# Patient Record
Sex: Female | Born: 2000 | Hispanic: Yes | Marital: Single | Smoking: Never smoker
Health system: Southern US, Community
[De-identification: ages and names within clinical notes are randomized; demographics above are authoritative.]

## PROBLEM LIST (undated history)

## (undated) DIAGNOSIS — Z789 Other specified health status: Secondary | ICD-10-CM

## (undated) DIAGNOSIS — D65 Disseminated intravascular coagulation [defibrination syndrome]: Secondary | ICD-10-CM

## (undated) DIAGNOSIS — O009 Unspecified ectopic pregnancy without intrauterine pregnancy: Secondary | ICD-10-CM

## (undated) HISTORY — PX: NO PAST SURGERIES: SHX2092

## (undated) HISTORY — PX: TOOTH EXTRACTION: SUR596

---
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
## 2021-11-15 ENCOUNTER — Other Ambulatory Visit: Payer: Self-pay

## 2021-11-15 ENCOUNTER — Inpatient Hospital Stay (HOSPITAL_COMMUNITY)
Admission: AD | Admit: 2021-11-15 | Discharge: 2021-11-15 | Disposition: A | Discharge status: Home / Self Care | Payer: Self-pay | Attending: Obstetrics & Gynecology | Admitting: Obstetrics & Gynecology

## 2021-11-15 ENCOUNTER — Encounter (HOSPITAL_COMMUNITY): Payer: Self-pay | Admitting: Obstetrics & Gynecology

## 2021-11-15 DIAGNOSIS — Z6790 Unspecified blood type, Rh positive: Secondary | ICD-10-CM

## 2021-11-15 DIAGNOSIS — O26851 Spotting complicating pregnancy, first trimester: Secondary | ICD-10-CM

## 2021-11-15 DIAGNOSIS — O4690 Antepartum hemorrhage, unspecified, unspecified trimester: Secondary | ICD-10-CM

## 2021-11-15 DIAGNOSIS — Z3A10 10 weeks gestation of pregnancy: Secondary | ICD-10-CM

## 2021-11-15 LAB — URINALYSIS, ROUTINE W REFLEX MICROSCOPIC
Bilirubin Urine: NEGATIVE
Glucose, UA: NEGATIVE mg/dL
Ketones, ur: NEGATIVE mg/dL
Nitrite: NEGATIVE
Protein, ur: NEGATIVE mg/dL
Specific Gravity, Urine: >1.03 — ABNORMAL HIGH (ref 1.005–1.030)
pH: 5.5 (ref 5.0–8.0)

## 2021-11-15 LAB — WET PREP, GENITAL
Clue Cells Wet Prep HPF POC: NONE SEEN
Sperm: NONE SEEN
Trich, Wet Prep: NONE SEEN
WBC, Wet Prep HPF POC: >=10 — AB (ref <10)
Yeast Wet Prep HPF POC: NONE SEEN

## 2021-11-15 LAB — URINALYSIS, MICROSCOPIC (REFLEX): RBC / HPF: NONE SEEN RBC/hpf (ref 0–5)

## 2021-11-15 LAB — HCG, QUANTITATIVE, PREGNANCY: hCG, Beta Chain, Quant, S: 91301 m[IU]/mL — ABNORMAL HIGH (ref <5)

## 2021-11-15 LAB — CBC
HCT: 36.4 % (ref 36.0–46.0)
Hemoglobin: 12.6 g/dL (ref 12.0–15.0)
MCH: 29.4 pg (ref 26.0–34.0)
MCHC: 34.6 g/dL (ref 30.0–36.0)
MCV: 85 fL (ref 80.0–100.0)
Platelets: 299 10*3/uL (ref 150–400)
RBC: 4.28 MIL/uL (ref 3.87–5.11)
RDW: 12.7 % (ref 11.5–15.5)
WBC: 8.5 10*3/uL (ref 4.0–10.5)
nRBC: 0 % (ref 0.0–0.2)

## 2021-11-15 LAB — POC URINE PREG, ED: Preg Test, Ur: POSITIVE — AB

## 2021-11-15 NOTE — MAU Note (Signed)
.  Toni Klein is a 20 y.o. at [redacted]w[redacted]d here in MAU reporting: spotting when she wipes and lower abdominal cramping that starting this morning. Last IC was x3 days ago.   Pain score: 6 Vitals:   11/15/21 0704 11/15/21 0954  BP: 118/74 126/76  Pulse: 93 82  Resp: 14 15  Temp: 98.4 F (36.9 C) 98.1 F (36.7 C)  SpO2: 100% 100%     FHT:165 Lab orders placed from triage:  UA

## 2021-11-15 NOTE — ED Provider Notes (Signed)
Emergency Medicine Provider Triage Evaluation Note  Toni Klein , a 20 y.o. female  was evaluated in triage.  Pt complains of vaginal bleeding.  Review of Systems  Positive: Scan vaginal bleeding, lower abd pain Negative: Dysuria, fever  Physical Exam  BP 118/74 (BP Location: Right Arm)    Pulse 93    Temp 98.4 F (36.9 C) (Oral)    Resp 14    SpO2 100%  Gen:   Awake, no distress   Resp:  Normal effort  MSK:   Moves extremities without difficulty  Other:  Mild suprapubic tenderness  Medical Decision Making  Medically screening exam initiated at 8:24 AM.  Appropriate orders placed.  Citlally Leonette Monarch was informed that the remainder of the evaluation will be completed by another provider, this initial triage assessment does not replace that evaluation, and the importance of remaining in the ED until their evaluation is complete.  Scant vaginal bleeding and lower abd pain that started this AM.  Pt is [redacted] weeks pregnant with prior formal US confirming IUP.  3 prior miscarriage.    Fayrene Helper, PA-C 11/15/21 Theodis Blaze    Arby Barrette, MD 11/15/21 [phone]

## 2021-11-15 NOTE — MAU Provider Note (Signed)
History     CSN: [id number]  Arrival date and time: 11/15/21 2694   Event Date/Time   First Provider Initiated Contact with Patient 11/15/21 1003      Chief Complaint  Patient presents with   Vaginal Bleeding   20 y.o. G4P0030 @10 .6 wks with IUP presenting with spotting. Reports onset his am. She saw some blood when she voided and then again when she wiped. Blood was brown and scant. Denies itching or malodor. No recent IC. Denies pain. She was getting care in and had [place] with confirmed single IUP.   OB History     Gravida  4   Para  0   Term      Preterm      AB  3   Living  0      SAB  2   IAB      Ectopic  1   Multiple      Live Births  0           Past Medical History:  Diagnosis Date   Medical history non-contributory     Past Surgical History:  Procedure Laterality Date   NO PAST SURGERIES      Family History  Problem Relation Age of Onset   Healthy Mother    Healthy Father     Social History   Tobacco Use   Smoking status: Never   Smokeless tobacco: Never  Vaping Use   Vaping Use: Never used  Substance Use Topics   Alcohol use: Never   Drug use: Never    Allergies: No Known Allergies  No medications prior to admission.    Review of Systems  Gastrointestinal:  Negative for abdominal pain.  Genitourinary:  Positive for vaginal bleeding. Negative for vaginal discharge.  Physical Exam   Blood pressure 97/60, pulse (!) 105, temperature 98.1 F (36.7 C), temperature source Oral, resp. rate 17, weight 42 kg, last menstrual period 08/31/2021, SpO2 100 %, unknown if currently breastfeeding.  Physical Exam Vitals and nursing note reviewed. Exam conducted with a chaperone present.  Constitutional:      Appearance: Normal appearance.  HENT:     Head: Normocephalic and atraumatic.  Pulmonary:     Effort: Pulmonary effort is normal. No respiratory distress.  Abdominal:     General: There is no distension.      Palpations: Abdomen is soft. There is no mass.     Tenderness: There is no abdominal tenderness. There is no guarding or rebound.     Hernia: No hernia is present.  Genitourinary:    Comments: External: no lesions or erythema Uterus: + enlarged, anteverted, + tender, no CMT Adnexae: no masses, no tenderness left, no tenderness right Cervix closed   Musculoskeletal:        General: Normal range of motion.     Cervical back: Normal range of motion.  Skin:    General: Skin is warm and dry.  Neurological:     General: No focal deficit present.     Mental Status: She is alert and oriented to person, place, and time.  Psychiatric:        Mood and Affect: Mood normal.        Behavior: Behavior normal.  FHT 165  Results for orders placed or performed during the hospital encounter of 11/15/21 (from the past 24 hour(s))  POC Urine Pregnancy, ED (not at [facility])     Status: Abnormal   Collection Time: 11/15/21  8:08 AM  Result Value Ref Range   Preg Test, Ur POSITIVE (A) NEGATIVE  CBC     Status: None   Collection Time: 11/15/21 10:00 AM  Result Value Ref Range   WBC 8.5 4.0 - 10.5 K/uL   RBC 4.28 3.87 - 5.11 MIL/uL   Hemoglobin 12.6 12.0 - 15.0 g/dL   HCT 17.7 93.9 - 03.0 %   MCV 85.0 80.0 - 100.0 fL   MCH 29.4 26.0 - 34.0 pg   MCHC 34.6 30.0 - 36.0 g/dL   RDW 09.2 33.0 - 07.6 %   Platelets 299 150 - 400 K/uL   nRBC 0.0 0.0 - 0.2 %  hCG, quantitative, pregnancy     Status: Abnormal   Collection Time: 11/15/21 10:00 AM  Result Value Ref Range   hCG, Beta Chain, Quant, S 91,301 (H) <5 mIU/mL  Urinalysis, Routine w reflex microscopic PATH Cytology Cervicovaginal Ancillary Only     Status: Abnormal   Collection Time: 11/15/21 10:02 AM  Result Value Ref Range   Color, Urine YELLOW YELLOW   APPearance CLEAR CLEAR   Specific Gravity, Urine >1.030 (H) 1.005 - 1.030   pH 5.5 5.0 - 8.0   Glucose, UA NEGATIVE NEGATIVE mg/dL   Hgb urine dipstick MODERATE (A) NEGATIVE   Bilirubin Urine  NEGATIVE NEGATIVE   Ketones, ur NEGATIVE NEGATIVE mg/dL   Protein, ur NEGATIVE NEGATIVE mg/dL   Nitrite NEGATIVE NEGATIVE   Leukocytes,Ua TRACE (A) NEGATIVE  Urinalysis, Microscopic (reflex)     Status: Abnormal   Collection Time: 11/15/21 10:02 AM  Result Value Ref Range   RBC / HPF NONE SEEN 0 - 5 RBC/hpf   WBC, UA 0-5 0 - 5 WBC/hpf   Bacteria, UA RARE (A) NONE SEEN   Squamous Epithelial / LPF 0-5 0 - 5   Mucus PRESENT    Cellular Cast, UA PRESENT    Ca Oxalate Crys, UA PRESENT   Wet prep, genital     Status: Abnormal   Collection Time: 11/15/21 10:15 AM   Specimen: PATH Cytology Cervicovaginal Ancillary Only  Result Value Ref Range   Yeast Wet Prep HPF POC NONE SEEN NONE SEEN   Trich, Wet Prep NONE SEEN NONE SEEN   Clue Cells Wet Prep HPF POC NONE SEEN NONE SEEN   WBC, Wet Prep HPF POC >=10 (A) <10   Sperm NONE SEEN      Media Information  MAU Course  Procedures  MDM Labs ordered and reviewed. Viability confirmed by doppler today. Pt reassured. Stable for discharge home.   Assessment and Plan   1. [redacted] weeks gestation of pregnancy   2. Vaginal bleeding in pregnancy   3. Blood type, Rh positive   4. Spotting affecting pregnancy in first trimester    Discharge home Follow up with [facility] to start care SAB precautions Pelvic rest  Allergies as of 11/15/2021   No Known Allergies      Medication List     TAKE these medications    famotidine 20 MG tablet Commonly known as: PEPCID Take 1 tablet (20 mg total) by mouth 2 (two) times daily.   prenatal multivitamin Tabs tablet Take 1 tablet by mouth daily at 12 noon.       Spanish interpreter use for all interactions  Donette Larry, CNM 11/15/2021, 12:19 PM

## 2021-11-16 LAB — GC/CHLAMYDIA PROBE AMP (~~LOC~~) NOT AT ARMC
Chlamydia: NEGATIVE
Comment: NEGATIVE
Comment: NORMAL
Neisseria Gonorrhea: NEGATIVE

## 2021-11-20 ENCOUNTER — Inpatient Hospital Stay (HOSPITAL_COMMUNITY)
Admission: AD | Admit: 2021-11-20 | Discharge: 2021-11-21 | Disposition: A | Discharge status: Home / Self Care | Payer: Self-pay | Source: Ambulatory Visit | Attending: Obstetrics and Gynecology | Admitting: Obstetrics and Gynecology

## 2021-11-20 ENCOUNTER — Other Ambulatory Visit: Payer: Self-pay

## 2021-11-20 ENCOUNTER — Encounter (HOSPITAL_COMMUNITY): Payer: Self-pay | Admitting: Obstetrics and Gynecology

## 2021-11-20 DIAGNOSIS — U071 COVID-19: Secondary | ICD-10-CM

## 2021-11-20 DIAGNOSIS — O26891 Other specified pregnancy related conditions, first trimester: Secondary | ICD-10-CM | POA: Insufficient documentation

## 2021-11-20 DIAGNOSIS — Z3A11 11 weeks gestation of pregnancy: Secondary | ICD-10-CM

## 2021-11-20 DIAGNOSIS — O98511 Other viral diseases complicating pregnancy, first trimester: Secondary | ICD-10-CM | POA: Insufficient documentation

## 2021-11-20 DIAGNOSIS — R0602 Shortness of breath: Secondary | ICD-10-CM | POA: Insufficient documentation

## 2021-11-20 DIAGNOSIS — R1110 Vomiting, unspecified: Secondary | ICD-10-CM | POA: Insufficient documentation

## 2021-11-20 DIAGNOSIS — R059 Cough, unspecified: Secondary | ICD-10-CM | POA: Insufficient documentation

## 2021-11-20 DIAGNOSIS — R21 Rash and other nonspecific skin eruption: Secondary | ICD-10-CM | POA: Insufficient documentation

## 2021-11-20 LAB — COMPREHENSIVE METABOLIC PANEL
ALT: 22 U/L (ref 0–44)
AST: 26 U/L (ref 15–41)
Albumin: 3.6 g/dL (ref 3.5–5.0)
Alkaline Phosphatase: 69 U/L (ref 38–126)
Anion gap: 9 (ref 5–15)
BUN: <5 mg/dL — ABNORMAL LOW (ref 6–20)
CO2: 20 mmol/L — ABNORMAL LOW (ref 22–32)
Calcium: 9.1 mg/dL (ref 8.9–10.3)
Chloride: 105 mmol/L (ref 98–111)
Creatinine, Ser: 0.48 mg/dL (ref 0.44–1.00)
GFR, Estimated: >60 mL/min (ref >60)
Glucose, Bld: 92 mg/dL (ref 70–99)
Potassium: 3.4 mmol/L — ABNORMAL LOW (ref 3.5–5.1)
Sodium: 134 mmol/L — ABNORMAL LOW (ref 135–145)
Total Bilirubin: 0.4 mg/dL (ref 0.3–1.2)
Total Protein: 6.8 g/dL (ref 6.5–8.1)

## 2021-11-20 LAB — URINALYSIS, ROUTINE W REFLEX MICROSCOPIC
Bilirubin Urine: NEGATIVE
Glucose, UA: NEGATIVE mg/dL
Hgb urine dipstick: NEGATIVE
Ketones, ur: 80 mg/dL — AB
Nitrite: NEGATIVE
Protein, ur: 30 mg/dL — AB
Specific Gravity, Urine: 1.02 (ref 1.005–1.030)
pH: 5 (ref 5.0–8.0)

## 2021-11-20 LAB — CBC WITH DIFFERENTIAL/PLATELET
Abs Immature Granulocytes: 0.03 10*3/uL (ref 0.00–0.07)
Basophils Absolute: 0 10*3/uL (ref 0.0–0.1)
Basophils Relative: 1 %
Eosinophils Absolute: 0 10*3/uL (ref 0.0–0.5)
Eosinophils Relative: 0 %
HCT: 34.1 % — ABNORMAL LOW (ref 36.0–46.0)
Hemoglobin: 12.2 g/dL (ref 12.0–15.0)
Immature Granulocytes: 0 %
Lymphocytes Relative: 10 %
Lymphs Abs: 0.7 10*3/uL (ref 0.7–4.0)
MCH: 30.3 pg (ref 26.0–34.0)
MCHC: 35.8 g/dL (ref 30.0–36.0)
MCV: 84.6 fL (ref 80.0–100.0)
Monocytes Absolute: 0.7 10*3/uL (ref 0.1–1.0)
Monocytes Relative: 10 %
Neutro Abs: 5.4 10*3/uL (ref 1.7–7.7)
Neutrophils Relative %: 79 %
Platelets: 273 10*3/uL (ref 150–400)
RBC: 4.03 MIL/uL (ref 3.87–5.11)
RDW: 12.6 % (ref 11.5–15.5)
WBC: 6.9 10*3/uL (ref 4.0–10.5)
nRBC: 0 % (ref 0.0–0.2)

## 2021-11-20 LAB — RESP PANEL BY RT-PCR (FLU A&B, COVID) ARPGX2
Influenza A by PCR: NEGATIVE
Influenza B by PCR: NEGATIVE
SARS Coronavirus 2 by RT PCR: POSITIVE — AB

## 2021-11-20 LAB — POCT PREGNANCY, URINE: Preg Test, Ur: POSITIVE — AB

## 2021-11-20 MED ORDER — BENZONATATE 100 MG PO CAPS
200.0000 mg | ORAL_CAPSULE | Freq: Once | ORAL | Status: AC
  Administered 2021-11-20: 200 mg via ORAL
  Filled 2021-11-20: qty 2

## 2021-11-20 MED ORDER — LACTATED RINGERS IV BOLUS
1000.0000 mL | Freq: Once | INTRAVENOUS | Status: AC
  Administered 2021-11-20: 1000 mL via INTRAVENOUS

## 2021-11-20 MED ORDER — ACETAMINOPHEN 500 MG PO TABS
1000.0000 mg | ORAL_TABLET | Freq: Once | ORAL | Status: AC
  Administered 2021-11-20: 1000 mg via ORAL
  Filled 2021-11-20: qty 2

## 2021-11-20 MED ORDER — LACTATED RINGERS IV SOLN
Freq: Once | INTRAVENOUS | Status: DC
  Filled 2021-11-20: qty 10

## 2021-11-20 NOTE — L&D Delivery Note (Addendum)
Delivery Note Toni Klein is a 21 y.o. G4P0030 at [redacted]w[redacted]d admitted for SOL.   GBS Status: Negative/-- (06/26 0000) Maximum Maternal Temperature: 99.1  Labor course: Initial SVE: 1.5/50%/-3. Augmentation with: Cytotec. She then progressed to complete.  ROM: 0h 87m with clear fluid  Birth: At 1724 a viable female was delivered via spontaneous vaginal delivery encaul (Presentation: OA Cephalic and spontaneously restituted to ROT without intervention). Nuchal cord present: Yes, x1 reduced following sac rupture. Shoulders and body delivered in usual fashion. Infant placed directly on mom's abdomen for bonding/skin-to-skin, baby dried and stimulated. Cord clamped x 2 after 1 minute and cut by FOB.  Cord blood collected.  The placenta separated spontaneously and delivered via gentle cord traction.  Pitocin infused rapidly IV per protocol.  Fundus firm with massage.   Placenta inspected and appears to be intact with a 3 VC.  Placenta/Cord with the following complications: None .   Perineum, cervix and vagina were carefully inspected and patient was noted to have bilateral periurethral abrasions in addition to a left labial and a 2nd degree perineal laceration. The left labial/periurethral was repaired with a 4-0 Monocryl suture. The perineal laceration was repaired with 3-0 vicryl. Patient was noted to be hemostatic following repair.   Sponge and instrument count were correct x2.  Intrapartum complications:  None Anesthesia:  epidural Episiotomy: none Lacerations:  2nd degree, labial, and bilateral periurethral Suture Repair: 3.0 vicryl and 4.0 monocryl EBL (mL): 200   Infant: APGAR (1 MIN): 8   APGAR (5 MINS): 9   APGAR (10 MINS):    Infant weight: pending  Mom to postpartum.  Baby to Couplet care / Skin to Skin. Placenta to L&D   Plans to Breast and bottlefeed Contraception: Patch  Circumcision: N/A  Note sent to [facility]: N/A, [facility] pt for pp visit.  Raylene Everts,  MD 06/13/2022 6:37 PM   Midwife attestation: I was gloved and present for delivery in its entirety and I agree with the above resident's note.  Claudette Head, CNM 10:02 PM

## 2021-11-21 DIAGNOSIS — O98511 Other viral diseases complicating pregnancy, first trimester: Secondary | ICD-10-CM

## 2021-11-21 DIAGNOSIS — U071 COVID-19: Secondary | ICD-10-CM

## 2021-11-21 DIAGNOSIS — Z3A11 11 weeks gestation of pregnancy: Secondary | ICD-10-CM

## 2021-11-21 LAB — CULTURE, OB URINE: Culture: <10000 — AB

## 2021-12-12 LAB — OB RESULTS CONSOLE RPR: RPR: NONREACTIVE

## 2021-12-12 LAB — OB RESULTS CONSOLE GC/CHLAMYDIA
Chlamydia: NEGATIVE
Neisseria Gonorrhea: NEGATIVE

## 2021-12-12 LAB — OB RESULTS CONSOLE RUBELLA ANTIBODY, IGM: Rubella: IMMUNE

## 2021-12-12 LAB — HEPATITIS C ANTIBODY
HCV Ab: NEGATIVE
HCV Ab: NEGATIVE

## 2021-12-12 LAB — OB RESULTS CONSOLE ANTIBODY SCREEN: Antibody Screen: NEGATIVE

## 2021-12-12 LAB — OB RESULTS CONSOLE ABO/RH: RH Type: POSITIVE

## 2021-12-12 LAB — OB RESULTS CONSOLE HEPATITIS B SURFACE ANTIGEN: Hepatitis B Surface Ag: NEGATIVE

## 2021-12-12 LAB — OB RESULTS CONSOLE VARICELLA ZOSTER ANTIBODY, IGG: Varicella: IMMUNE

## 2021-12-12 LAB — OB RESULTS CONSOLE HIV ANTIBODY (ROUTINE TESTING): HIV: NONREACTIVE

## 2021-12-12 LAB — CYSTIC FIBROSIS DIAGNOSTIC STUDY: Interpretation-CFDNA:: NEGATIVE

## 2021-12-17 ENCOUNTER — Other Ambulatory Visit: Payer: Self-pay

## 2021-12-17 ENCOUNTER — Inpatient Hospital Stay (HOSPITAL_COMMUNITY)
Admission: AD | Admit: 2021-12-17 | Discharge: 2021-12-17 | Disposition: A | Discharge status: Home / Self Care | Payer: Self-pay | Attending: Obstetrics & Gynecology | Admitting: Obstetrics & Gynecology

## 2021-12-17 DIAGNOSIS — R197 Diarrhea, unspecified: Secondary | ICD-10-CM | POA: Insufficient documentation

## 2021-12-17 DIAGNOSIS — O209 Hemorrhage in early pregnancy, unspecified: Secondary | ICD-10-CM | POA: Insufficient documentation

## 2021-12-17 DIAGNOSIS — K5900 Constipation, unspecified: Secondary | ICD-10-CM | POA: Insufficient documentation

## 2021-12-17 DIAGNOSIS — Z3A15 15 weeks gestation of pregnancy: Secondary | ICD-10-CM | POA: Insufficient documentation

## 2021-12-17 DIAGNOSIS — O2242 Hemorrhoids in pregnancy, second trimester: Secondary | ICD-10-CM | POA: Insufficient documentation

## 2021-12-17 DIAGNOSIS — K625 Hemorrhage of anus and rectum: Secondary | ICD-10-CM | POA: Insufficient documentation

## 2021-12-17 DIAGNOSIS — O99612 Diseases of the digestive system complicating pregnancy, second trimester: Secondary | ICD-10-CM | POA: Insufficient documentation

## 2021-12-17 MED ORDER — HYDROCORTISONE (PERIANAL) 2.5 % EX CREA
TOPICAL_CREAM | Freq: Once | CUTANEOUS | Status: AC
  Filled 2021-12-17: qty 28.35

## 2021-12-17 MED ORDER — WITCH HAZEL-GLYCERIN EX PADS: 1.0000 "application " | MEDICATED_PAD | CUTANEOUS | 3 refills | Status: DC | PRN

## 2021-12-17 NOTE — MAU Note (Signed)
Pt reports to mau with c/o bleeding from rectum that started this morning.  Report no pain but states she has continued to see the bleeding each time she uses the restroom.  Denies vag bleeding or abd pain.

## 2021-12-17 NOTE — MAU Provider Note (Signed)
History     CSN: [id number]  Arrival date and time: 12/17/21 1335   Event Date/Time   First Provider Initiated Contact with Patient 12/17/21 1443      Chief Complaint  Patient presents with   Rectal Bleeding   HPI Toni Klein is a 21 y.o. G4P0030 at [redacted]w[redacted]d who presents with rectal bleeding. She reports each time she goes to the bathroom, she sees bleeding when she wipes. She is not having to wear a pad and is not seeing blood in her underwear. She denies any pain. She reports she has issues with both constipation and diarrhea.   OB History     Gravida  4   Para  0   Term      Preterm      AB  3   Living  0      SAB  2   IAB      Ectopic  1   Multiple      Live Births  0           Past Medical History:  Diagnosis Date   Medical history non-contributory     Past Surgical History:  Procedure Laterality Date   NO PAST SURGERIES      Family History  Problem Relation Age of Onset   Healthy Mother    Healthy Father     Social History   Tobacco Use   Smoking status: Never   Smokeless tobacco: Never  Vaping Use   Vaping Use: Never used  Substance Use Topics   Alcohol use: Never   Drug use: Never    Allergies: No Known Allergies  Medications Prior to Admission  Medication Sig Dispense Refill Last Dose   famotidine (PEPCID) 20 MG tablet Take 1 tablet (20 mg total) by mouth 2 (two) times daily. 60 tablet 1    Prenatal Vit-Fe Fumarate-FA (PRENATAL MULTIVITAMIN) TABS tablet Take 1 tablet by mouth daily at 12 noon.       Review of Systems  Constitutional: Negative.  Negative for fatigue and fever.  HENT: Negative.    Respiratory: Negative.  Negative for shortness of breath.   Cardiovascular: Negative.  Negative for chest pain.  Gastrointestinal:  Positive for anal bleeding. Negative for abdominal pain, constipation, diarrhea, nausea and vomiting.  Genitourinary: Negative.  Negative for dysuria.  Neurological: Negative.   Negative for dizziness and headaches.  Physical Exam   Blood pressure 115/69, pulse 99, temperature 98.5 F (36.9 C), temperature source Oral, resp. rate 15, last menstrual period 08/31/2021, SpO2 99 %, unknown if currently breastfeeding.  Physical Exam Vitals and nursing note reviewed.  Constitutional:      General: She is not in acute distress.    Appearance: She is well-developed.  HENT:     Head: Normocephalic.  Eyes:     Pupils: Pupils are equal, round, and reactive to light.  Cardiovascular:     Rate and Rhythm: Normal rate and regular rhythm.     Heart sounds: Normal heart sounds.  Pulmonary:     Effort: Pulmonary effort is normal. No respiratory distress.     Breath sounds: Normal breath sounds.  Abdominal:     General: Bowel sounds are normal. There is no distension.     Palpations: Abdomen is soft.     Tenderness: There is no abdominal tenderness.  Genitourinary:    Rectum: External hemorrhoid present.  Skin:    General: Skin is warm and dry.  Neurological:  Mental Status: She is alert and oriented to person, place, and time.  Psychiatric:        Mood and Affect: Mood normal.        Behavior: Behavior normal.        Thought Content: Thought content normal.        Judgment: Judgment normal.    MAU Course  Procedures  MDM Hemorrhage management reviewed. Encouraged keeping a food diary to identify triggers of both constipation and diarrhea.  Anusol  Assessment and Plan   1. Hemorrhoids during pregnancy in second trimester   2. [redacted] weeks gestation of pregnancy    -Discharge home in stable condition -Rx for tucks pads sent to patient's pharmacy -Second trimester precautions discussed -Patient advised to follow-up with OB as scheduled for prenatal care -Patient may return to MAU as needed or if her condition were to change or worsen   Rolm Bookbinder CNM 12/17/2021, 2:43 PM

## 2021-12-17 NOTE — Discharge Instructions (Signed)

## 2022-01-09 ENCOUNTER — Other Ambulatory Visit: Payer: Self-pay

## 2022-01-16 ENCOUNTER — Ambulatory Visit: Payer: Self-pay | Admitting: *Deleted

## 2022-01-16 ENCOUNTER — Other Ambulatory Visit: Payer: Self-pay

## 2022-01-16 ENCOUNTER — Ambulatory Visit (HOSPITAL_BASED_OUTPATIENT_CLINIC_OR_DEPARTMENT_OTHER): Payer: Self-pay

## 2022-01-16 ENCOUNTER — Other Ambulatory Visit: Payer: Self-pay | Admitting: Obstetrics & Gynecology

## 2022-01-16 ENCOUNTER — Ambulatory Visit: Payer: Self-pay | Attending: Obstetrics & Gynecology

## 2022-01-16 VITALS — BP 99/71 | HR 98

## 2022-01-16 DIAGNOSIS — Z361 Encounter for antenatal screening for raised alphafetoprotein level: Secondary | ICD-10-CM | POA: Insufficient documentation

## 2022-01-16 DIAGNOSIS — O280 Abnormal hematological finding on antenatal screening of mother: Secondary | ICD-10-CM | POA: Insufficient documentation

## 2022-01-16 DIAGNOSIS — R772 Abnormality of alphafetoprotein: Secondary | ICD-10-CM | POA: Insufficient documentation

## 2022-01-16 DIAGNOSIS — Z363 Encounter for antenatal screening for malformations: Secondary | ICD-10-CM | POA: Insufficient documentation

## 2022-01-16 NOTE — Progress Notes (Signed)
Name: Toni Klein Indication: MSAFP indicating increased risk for an ONTD in this pregnancy (1 in 3)  DOB: 2001-08-20 Age: 21 y.o.   EDC: 06/07/2022 LMP: 08/01/2021 Referring Provider:  Osborne Oman, MD  EGA: [redacted]w[redacted]d Genetic Counselor: Staci Righter, MS, [place]  OB HxTT:[id number] Date of Appointment: 01/16/2022  Accompanied by: Spanish interpreter ([place] [phone]). Face to Face Time: 30 Minutes   Previous Testing Completed: Toni Klein previously completed Non-Invasive Prenatal Screening (NIPS) in this pregnancy. The result is low risk. This screening significantly reduces the risk that the current pregnancy has Down syndrome, Trisomy 81, Trisomy 45, and common sex chromosome conditions, however, the risk is not zero given the limitations of NIPS. Additionally, there are many genetic conditions that cannot be detected by NIPS.  Toni Klein previously completed carrier screening. She screened to not be a carrier for Cystic Fibrosis (CF), Spinal Muscular Atrophy (SMA), alpha thalassemia, and beta hemoglobinopathies. A negative result on carrier screening reduces the likelihood of being a carrier, however, does not entirely rule out the possibility.   Medical & Family History:  This is Toni Klein's 4th pregnancy. She reports she has had two spontaneous losses. She is not sure how far along she was with these two spontaneous losses. She also reports one ectopic pregnancy. Reports she takes prenatal vitamins. Denies personal history of diabetes, high blood pressure, thyroid conditions, and seizures. Denies bleeding, infections, and fevers in this pregnancy. Denies using tobacco, alcohol, or street drugs in this pregnancy. Toni Klein denied consanguinity and also a family history of chromosome conditions, intellectual disability, autism, birth defects, bone/skeletal disorders, blindness, deafness, blood disorders, neuromuscular disorders, still births, early  infant deaths, and 3 or more pregnancy losses for one person on her prenatal intake questionnaire.      Genetic Counseling:   MSAFP high risk for Open Neural Tube Defect (ONTD) risk is 1 in 135 after the screen. We discussed that the maternal serum AFP was elevated. This could indicate that the pregnancy has an ONTD which is a birth defect involving the incomplete development of the brain, spinal cord, or their protective coverings. Toni Klein's anatomy ultrasound today did not detect an ONTD. Prenatal detection of ONTDs is largely influenced by the gestational age and type of neural tube defect. A second trimester ultrasound identifies virtually 100% of anencephaly cases. A second trimester ultrasound also routinely evaluates for spina bifida, and examination of the entire length of the spine in the sagittal, axial, and coronal planes in combination with a cranial evaluation identifies many cases: the detection rate is approximately 90-98%. If [employer] desires, she can pursue an amniocentesis to evaluate the amniotic fluid AFP with reflex to acetylcholinesterase.    Testing/Screening Options:   Amniocentesis. This procedure is available for prenatal diagnosis. Possible procedural difficulties and complications that can arise include maternal infection, cramping, bleeding, fluid leakage, and/or pregnancy loss. The risk for pregnancy loss with an amniocentesis is 1/500. Per the [employer]necologists (ACOG) Practice Bulletin 162, all pregnant women should be offered prenatal assessment for aneuploidy by diagnostic testing regardless of maternal age or other risk factors. If indicated, genetic testing that could be ordered on an amniocentesis sample includes AF-AFP, ACHE, a fetal karyotype, fetal microarray, and testing for specific syndromes.     Patient Plan:  Proceed with: Routine prenatal care Informed consent was obtained. All questions were  answered.  Declined: Amniocentesis for prenatal diagnosis    Thank you for sharing in the  care of Toni Klein with [place].  Please do not hesitate to contact us if you have any questions.  Staci Righter, MS, [facility]

## 2022-01-17 ENCOUNTER — Telehealth: Payer: Self-pay

## 2022-01-17 NOTE — Telephone Encounter (Signed)
Called patient to notify if of her follow up appointment in 4 wks. Patient states she will [facility].

## 2022-01-17 NOTE — Telephone Encounter (Signed)
Spoke with Legrand Como - determined that patient walked out with Referral form for Follow up ultrasound to complete anatomy - Alma Friendly called patient, because Medicaid expired 01/17/22 - patient desires to have follow up ultrasound at [facility] to let her know the out come - Stanton Kidney will call patient to get scheduled for Follow up utlrasound at [facility]

## 2022-01-19 ENCOUNTER — Other Ambulatory Visit: Payer: Self-pay

## 2022-03-19 ENCOUNTER — Inpatient Hospital Stay (HOSPITAL_COMMUNITY)
Admission: AD | Admit: 2022-03-19 | Discharge: 2022-03-19 | Disposition: A | Discharge status: Home / Self Care | Payer: Medicaid Other | Attending: Obstetrics & Gynecology | Admitting: Obstetrics & Gynecology

## 2022-03-19 ENCOUNTER — Encounter (HOSPITAL_COMMUNITY): Payer: Self-pay | Admitting: Obstetrics & Gynecology

## 2022-03-19 DIAGNOSIS — W19XXXA Unspecified fall, initial encounter: Secondary | ICD-10-CM | POA: Diagnosis not present

## 2022-03-19 DIAGNOSIS — Z3A28 28 weeks gestation of pregnancy: Secondary | ICD-10-CM | POA: Insufficient documentation

## 2022-03-19 DIAGNOSIS — O9A213 Injury, poisoning and certain other consequences of external causes complicating pregnancy, third trimester: Secondary | ICD-10-CM | POA: Insufficient documentation

## 2022-03-19 DIAGNOSIS — R102 Pelvic and perineal pain: Secondary | ICD-10-CM | POA: Insufficient documentation

## 2022-03-19 MED ORDER — CYCLOBENZAPRINE HCL 5 MG PO TABS
10.0000 mg | ORAL_TABLET | Freq: Once | ORAL | Status: DC
  Filled 2022-03-19: qty 2

## 2022-03-19 MED ORDER — CYCLOBENZAPRINE HCL 5 MG PO TABS
10.0000 mg | ORAL_TABLET | Freq: Once | ORAL | Status: AC
  Administered 2022-03-19: 10 mg via ORAL
  Filled 2022-03-19: qty 2

## 2022-03-19 MED ORDER — CYCLOBENZAPRINE HCL 10 MG PO TABS: 10.0000 mg | ORAL_TABLET | Freq: Two times a day (BID) | ORAL | 0 refills | Status: DC | PRN

## 2022-03-19 NOTE — MAU Provider Note (Signed)
?History  ?  ? ?CSN: [id number] ? ?Arrival date and time: 03/19/22 1454 ? ? Event Date/Time  ? First Provider Initiated Contact with Patient 03/19/22 1608   ?  ? ?Chief Complaint  ?Patient presents with  ? Fall  ? Vaginal Pain  ? ?HPI ?Toni Klein is a 21 y.o. G4P0030 at [redacted]w[redacted]d who presents after a fall for pelvic pain. She states she was walking with a basket yesterday and rolled her ankle. She fell and landed on her bottom. She denies any trauma to her abdomen or landing on her abdomen. She denies any bleeding or leaking. She reports normal fetal movement. Since the fall, she reports sharp shooting pelvic pain and vaginal pain that comes and goes. She has not tried anything for the pain.  ? ?OB History   ? ? Gravida  ?4  ? Para  ?0  ? Term  ?   ? Preterm  ?   ? AB  ?3  ? Living  ?0  ?  ? ? SAB  ?2  ? IAB  ?   ? Ectopic  ?1  ? Multiple  ?   ? Live Births  ?0  ?   ?  ?  ? ? ?Past Medical History:  ?Diagnosis Date  ? Medical history non-contributory   ? ? ?Past Surgical History:  ?Procedure Laterality Date  ? NO PAST SURGERIES    ? ? ?Family History  ?Problem Relation Age of Onset  ? Healthy Mother   ? Healthy Father   ? ? ?Social History  ? ?Tobacco Use  ? Smoking status: Never  ? Smokeless tobacco: Never  ?Vaping Use  ? Vaping Use: Never used  ?Substance Use Topics  ? Alcohol use: Never  ? Drug use: Never  ? ? ?Allergies: No Known Allergies ? ?Medications Prior to Admission  ?Medication Sig Dispense Refill Last Dose  ? famotidine (PEPCID) 20 MG tablet Take 1 tablet (20 mg total) by mouth 2 (two) times daily. 60 tablet 1   ? Prenatal Vit-Fe Fumarate-FA (PRENATAL MULTIVITAMIN) TABS tablet Take 1 tablet by mouth daily at 12 noon.     ? witch hazel-glycerin (TUCKS) pad Apply 1 application topically as needed for itching. 40 each 3   ? ? ?Review of Systems  ?Constitutional: Negative.  Negative for fatigue and fever.  ?HENT: Negative.    ?Respiratory: Negative.  Negative for shortness of breath.    ?Cardiovascular: Negative.  Negative for chest pain.  ?Gastrointestinal: Negative.  Negative for abdominal pain, constipation, diarrhea, nausea and vomiting.  ?Genitourinary:  Positive for pelvic pain. Negative for dysuria, vaginal bleeding and vaginal discharge.  ?Neurological: Negative.  Negative for dizziness and headaches.  ?Physical Exam  ? ?Blood pressure 119/72, pulse 90, temperature 98.1 ?F (36.7 ?C), temperature source Oral, resp. rate 16, weight 52.8 kg, last menstrual period 08/31/2021, SpO2 100 %, unknown if currently breastfeeding. ? ?Physical Exam ?Vitals and nursing note reviewed.  ?Constitutional:   ?   General: She is not in acute distress. ?   Appearance: She is well-developed.  ?HENT:  ?   Head: Normocephalic.  ?Eyes:  ?   Pupils: Pupils are equal, round, and reactive to light.  ?Cardiovascular:  ?   Rate and Rhythm: Normal rate and regular rhythm.  ?   Heart sounds: Normal heart sounds.  ?Pulmonary:  ?   Effort: Pulmonary effort is normal. No respiratory distress.  ?   Breath sounds: Normal breath sounds.  ?Abdominal:  ?  General: Bowel sounds are normal. There is no distension.  ?   Palpations: Abdomen is soft.  ?   Tenderness: There is no abdominal tenderness.  ?Skin: ?   General: Skin is warm and dry.  ?Neurological:  ?   Mental Status: She is alert and oriented to person, place, and time.  ?Psychiatric:     ?   Mood and Affect: Mood normal.     ?   Behavior: Behavior normal.     ?   Thought Content: Thought content normal.     ?   Judgment: Judgment normal.  ? ?Fetal Tracing: ? ?Baseline: 140 ?Variability: moderate ?Accels: 10x10 ?Decels: none ? ?Toco: none ? ? Cervix: closed/thick/posterior ? ?MAU Course  ?Procedures ? ?MDM ?NST reactive, no contractions noted on TOCO ?Cervix closed.  ?Flexeril PO ? ?Fall precautions reviewed at length and encouraged patient to come in for monitoring in the future. Reassurance of normalcy of exam findings today.  ? ?Assessment and Plan  ? ?1. Fall, initial  encounter   ?2. Pelvic pain   ?3. [redacted] weeks gestation of pregnancy   ? ?-Discharge home in stable condition ?-Rx for flexeril sent to patient's pharmacy ?-Third trimester precautions discussed ?-Patient advised to follow-up with OB as scheduled for prenatal care ?-Patient may return to MAU as needed or if her condition were to change or worsen ? ? ?Rolm Bookbinder CNM ?03/19/2022, 4:08 PM  ?

## 2022-03-19 NOTE — MAU Note (Signed)
Toni Klein is a 21 y.o. at [redacted]w[redacted]d here in MAU reporting: fell yesterday afternoon.  Didn't feel any pain.  Later started having sharp pains in her vaginal area, has gotten worse.  feeling something between a pain and a pressure. No bleeding or leaking. +FM.  Between the pain and the pressure, it makes her have to pee.  ? ?Onset of complaint: 1800 ?Pain score: 7 ?Vitals:  ? 03/19/22 1524  ?BP: 119/72  ?Pulse: 90  ?Resp: 16  ?Temp: 98.1 ?F (36.7 ?C)  ?SpO2: 100%  ?   ?FHT:152 ?Lab orders placed from triage:  urine ?

## 2022-03-19 NOTE — Discharge Instructions (Signed)

## 2022-05-15 LAB — OB RESULTS CONSOLE GBS: GBS: NEGATIVE

## 2022-06-04 ENCOUNTER — Inpatient Hospital Stay (HOSPITAL_COMMUNITY)
Admission: AD | Admit: 2022-06-04 | Discharge: 2022-06-04 | Disposition: A | Discharge status: Home / Self Care | Payer: Self-pay | Attending: Obstetrics and Gynecology | Admitting: Obstetrics and Gynecology

## 2022-06-04 DIAGNOSIS — O479 False labor, unspecified: Secondary | ICD-10-CM

## 2022-06-04 DIAGNOSIS — B3731 Acute candidiasis of vulva and vagina: Secondary | ICD-10-CM | POA: Insufficient documentation

## 2022-06-04 MED ORDER — MONISTAT 7 COMBO PACK APP 100 & 2 MG-% (9GM) VA KIT: 1.0000 | PACK | Freq: Every day | VAGINAL | 2 refills | Status: DC

## 2022-06-04 NOTE — MAU Note (Signed)
.  Toni Klein is a 21 y.o. at [redacted]w[redacted]d here in MAU reporting: irreg ctx that started this morning at 0500 and one episode of brown mucous like dc.  Denies LOF. +FM   Onset of complaint: 0500 Pain score: 5/10 Vitals:   06/04/22 1433  BP: 120/79  Pulse: 94  Resp: 16  Temp: 99 F (37.2 C)  SpO2: 98%     FHT:155 Lab orders placed from triage:

## 2022-06-09 ENCOUNTER — Telehealth (HOSPITAL_COMMUNITY): Payer: Self-pay | Admitting: *Deleted

## 2022-06-09 NOTE — Telephone Encounter (Signed)
Preadmission screen  

## 2022-06-12 ENCOUNTER — Other Ambulatory Visit (HOSPITAL_COMMUNITY): Payer: Self-pay | Admitting: Advanced Practice Midwife

## 2022-06-12 NOTE — H&P (Signed)
OBSTETRIC ADMISSION HISTORY AND PHYSICAL  Toni Klein is a 21 y.o. female [phone] with IUP at 22w6dby LMP presenting for IOL due to postdates. She reports +FMs, no LOF, no VB, no blurry vision, headaches, peripheral edema, or RUQ pain.  She plans on breast and formula feeding. She is planning to use pills versus the patch for birth control postpartum.  [facility]  Dating: By LMP --->  Estimated Date of Delivery: 06/07/22  Sono:   _0 , CWD, normal anatomy, cephalic presentation, anterior placental lie, 317 g, 54% EFW  Prenatal History/Complications:  Elevated MSAFP  Past Medical History: Past Medical History:  Diagnosis Date   Medical history non-contributory     Past Surgical History: Past Surgical History:  Procedure Laterality Date   NO PAST SURGERIES      Obstetrical History: OB History     Gravida  4   Para  0   Term      Preterm      AB  3   Living  0      SAB  2   IAB      Ectopic  1   Multiple      Live Births  0           Social History Social History   Socioeconomic History   Marital status: Single    Spouse name: Not on file   Number of children: Not on file   Years of education: Not on file   Highest education level: Not on file  Occupational History   Not on file  Tobacco Use   Smoking status: Never   Smokeless tobacco: Never  Vaping Use   Vaping Use: Never used  Substance and Sexual Activity   Alcohol use: Never   Drug use: Never   Sexual activity: Yes  Other Topics Concern   Not on file  Social History Narrative   Not on file   Social Determinants of Health   Financial Resource Strain: Not on file  Food Insecurity: Not on file  Transportation Needs: Not on file  Physical Activity: Not on file  Stress: Not on file  Social Connections: Not on file    Family History: Family History  Problem Relation Age of Onset   Healthy Mother    Healthy Father      Allergies: No Known Allergies  Medications Prior to Admission  Medication Sig Dispense Refill Last Dose   cyclobenzaprine (FLEXERIL) 10 MG tablet Take 1 tablet (10 mg total) by mouth 2 (two) times daily as needed for muscle spasms. 20 tablet 0    famotidine (PEPCID) 20 MG tablet Take 1 tablet (20 mg total) by mouth 2 (two) times daily. 60 tablet 1    Miconazole Nitrate Applicator (MONISTAT 7 COMBO PACK APP) 100 & 2 MG-% (9GM) KIT Place 1 applicator vaginally daily. 1 kit 2    Prenatal Vit-Fe Fumarate-FA (PRENATAL MULTIVITAMIN) TABS tablet Take 1 tablet by mouth daily at 12 noon.      witch hazel-glycerin (TUCKS) pad Apply 1 application topically as needed for itching. 40 each 3    Review of Systems  All systems reviewed and negative except as stated in HPI  Blood pressure 114/75, pulse 80, temperature 99.1 F (37.3 C), temperature source Oral, height _1  (1.549 m), weight 59.6 kg, last menstrual period 08/31/2021, unknown if currently breastfeeding.  General appearance: alert, cooperative, and no distress Lungs: normal work of breathing on room  air  Heart: normal rate, warm and well perfused  Abdomen: soft, non-tender, gravid  Extremities: no LE edema or calf tenderness to palpation   Presentation: Cephalic Fetal monitoring: Baseline 155 bpm, moderate variability, +  accels, occasional mild variable decels Uterine activity: Occasional contractions  Dilation: 1.5 Effacement (%): 50 Station: -3 Exam by:: Dr. Gwenlyn Perking  Prenatal labs: ABO, Rh: --/--/A POS (07/25 0040) Antibody: NEG (07/25 0040) Rubella: Immune (01/23 0000) RPR: Nonreactive (01/23 0000)  HBsAg: Negative (01/23 0000)  HIV: Non-reactive (01/23 0000)  GBS: Negative/-- (06/26 0000)  1 hr Glucola normal  Genetic screening - LR NIPS, Horizon neg, AFP elevated (seen by MFM for detailed anatomy, no abnormalities seen)  Anatomy US normal   Prenatal Transfer Tool  Maternal Diabetes: No Genetic Screening: As above   Maternal Ultrasounds/Referrals: Normal Fetal Ultrasounds or other Referrals:  None Maternal Substance Abuse:  No Significant Maternal Medications:  None Significant Maternal Lab Results: Group B Strep negative  Results for orders placed or performed during the hospital encounter of 06/13/22 (from the past 24 hour(s))  CBC   Collection Time: 06/13/22 12:19 AM  Result Value Ref Range   WBC 11.4 (H) 4.0 - 10.5 K/uL   RBC 4.25 3.87 - 5.11 MIL/uL   Hemoglobin 13.1 12.0 - 15.0 g/dL   HCT 37.7 36.0 - 46.0 %   MCV 88.7 80.0 - 100.0 fL   MCH 30.8 26.0 - 34.0 pg   MCHC 34.7 30.0 - 36.0 g/dL   RDW 12.7 11.5 - 15.5 %   Platelets 285 150 - 400 K/uL   nRBC 0.0 0.0 - 0.2 %  Type and screen [place]   Collection Time: 06/13/22 12:40 AM  Result Value Ref Range   ABO/RH(D) A POS    Antibody Screen NEG    Sample Expiration      06/16/2022,2359 Performed at [facility], [place] [address] [place], [place] [address]     Patient Active Problem List   Diagnosis Date Noted   Post-dates pregnancy 06/13/2022   Assessment/Plan:  Toni Klein is a 21 y.o. G4P0030 at 33w6dhere for IOL due to postdates.   #Labor: Will start induction with buccal Cytotec and reassess in 4 hours. Offered foley balloon placement. Patient would like to defer this for now but will consider on next check if applicable.  #Pain: PRN #FWB: Cat 2 due to occasional mild variable decelerations. Reassuring overall with moderate variability and accels. Will continue to monitor closely.  #ID:  GBS neg #MOF: Breast and formula  #MOC: Pills vs patch   [employer] video Spanish language interpreter used for encounter.   CGenia Del MD  06/13/2022, 1:49 AM

## 2022-06-13 ENCOUNTER — Inpatient Hospital Stay (HOSPITAL_COMMUNITY): Payer: Medicaid Other

## 2022-06-13 ENCOUNTER — Inpatient Hospital Stay (HOSPITAL_COMMUNITY): Payer: Medicaid Other | Admitting: Anesthesiology

## 2022-06-13 ENCOUNTER — Encounter (HOSPITAL_COMMUNITY): Payer: Self-pay | Admitting: Obstetrics and Gynecology

## 2022-06-13 ENCOUNTER — Other Ambulatory Visit: Payer: Self-pay

## 2022-06-13 ENCOUNTER — Inpatient Hospital Stay (HOSPITAL_COMMUNITY)
Admission: AD | Admit: 2022-06-13 | Discharge: 2022-06-15 | DRG: 807 | Disposition: A | Discharge status: Home / Self Care | Payer: Medicaid Other | Attending: Obstetrics and Gynecology | Admitting: Obstetrics and Gynecology

## 2022-06-13 DIAGNOSIS — O480 Post-term pregnancy: Secondary | ICD-10-CM | POA: Diagnosis present

## 2022-06-13 DIAGNOSIS — Z3A40 40 weeks gestation of pregnancy: Secondary | ICD-10-CM

## 2022-06-13 LAB — CBC
HCT: 37.7 % (ref 36.0–46.0)
Hemoglobin: 13.1 g/dL (ref 12.0–15.0)
MCH: 30.8 pg (ref 26.0–34.0)
MCHC: 34.7 g/dL (ref 30.0–36.0)
MCV: 88.7 fL (ref 80.0–100.0)
Platelets: 285 10*3/uL (ref 150–400)
RBC: 4.25 MIL/uL (ref 3.87–5.11)
RDW: 12.7 % (ref 11.5–15.5)
WBC: 11.4 10*3/uL — ABNORMAL HIGH (ref 4.0–10.5)
nRBC: 0 % (ref 0.0–0.2)

## 2022-06-13 LAB — RPR: RPR Ser Ql: NONREACTIVE

## 2022-06-13 LAB — TYPE AND SCREEN
ABO/RH(D): A POS
Antibody Screen: NEGATIVE

## 2022-06-13 MED ORDER — LACTATED RINGERS IV SOLN
500.0000 mL | Freq: Once | INTRAVENOUS | Status: AC
Start: 2022-06-13 — End: 2022-06-13
  Administered 2022-06-13: 500 mL via INTRAVENOUS

## 2022-06-13 MED ORDER — MISOPROSTOL 50MCG HALF TABLET
50.0000 ug | ORAL_TABLET | ORAL | Status: DC | PRN
  Administered 2022-06-13: 50 ug via BUCCAL
  Filled 2022-06-13: qty 1

## 2022-06-13 MED ORDER — PHENYLEPHRINE 80 MCG/ML (10ML) SYRINGE FOR IV PUSH (FOR BLOOD PRESSURE SUPPORT): 80.0000 ug | PREFILLED_SYRINGE | INTRAVENOUS | Status: DC | PRN

## 2022-06-13 MED ORDER — OXYTOCIN-SODIUM CHLORIDE 30-0.9 UT/500ML-% IV SOLN
2.5000 [IU]/h | INTRAVENOUS | Status: DC
  Filled 2022-06-13: qty 500

## 2022-06-13 MED ORDER — DIBUCAINE (PERIANAL) 1 % EX OINT: 1.0000 | TOPICAL_OINTMENT | CUTANEOUS | Status: DC | PRN

## 2022-06-13 MED ORDER — DIPHENHYDRAMINE HCL 50 MG/ML IJ SOLN: 12.5000 mg | INTRAMUSCULAR | Status: DC | PRN

## 2022-06-13 MED ORDER — WITCH HAZEL-GLYCERIN EX PADS
1.0000 | MEDICATED_PAD | CUTANEOUS | Status: DC | PRN
Start: 2022-06-13 — End: 2022-06-15

## 2022-06-13 MED ORDER — PRENATAL MULTIVITAMIN CH
1.0000 | ORAL_TABLET | Freq: Every day | ORAL | Status: DC
  Administered 2022-06-14 – 2022-06-15 (×2): 1 via ORAL
  Filled 2022-06-13 (×2): qty 1

## 2022-06-13 MED ORDER — TETANUS-DIPHTH-ACELL PERTUSSIS 5-2.5-18.5 LF-MCG/0.5 IM SUSY: 0.5000 mL | PREFILLED_SYRINGE | Freq: Once | INTRAMUSCULAR | Status: DC

## 2022-06-13 MED ORDER — ONDANSETRON HCL 4 MG PO TABS: 4.0000 mg | ORAL_TABLET | ORAL | Status: DC | PRN

## 2022-06-13 MED ORDER — FENTANYL CITRATE (PF) 100 MCG/2ML IJ SOLN
50.0000 ug | INTRAMUSCULAR | Status: DC | PRN
  Administered 2022-06-13: 100 ug via INTRAVENOUS
  Filled 2022-06-13: qty 2

## 2022-06-13 MED ORDER — FENTANYL-BUPIVACAINE-NACL 0.5-0.125-0.9 MG/250ML-% EP SOLN
12.0000 mL/h | EPIDURAL | Status: DC | PRN
  Administered 2022-06-13: 12 mL/h via EPIDURAL
  Filled 2022-06-13: qty 250

## 2022-06-13 MED ORDER — LIDOCAINE HCL (PF) 1 % IJ SOLN
INTRAMUSCULAR | Status: DC | PRN
  Administered 2022-06-13: 8 mL via EPIDURAL

## 2022-06-13 MED ORDER — ACETAMINOPHEN 325 MG PO TABS
650.0000 mg | ORAL_TABLET | ORAL | Status: DC | PRN
  Administered 2022-06-13 – 2022-06-15 (×4): 650 mg via ORAL
  Filled 2022-06-13 (×4): qty 2

## 2022-06-13 MED ORDER — ZOLPIDEM TARTRATE 5 MG PO TABS: 5.0000 mg | ORAL_TABLET | Freq: Every evening | ORAL | Status: DC | PRN

## 2022-06-13 MED ORDER — LIDOCAINE HCL (PF) 1 % IJ SOLN: 30.0000 mL | INTRAMUSCULAR | Status: DC | PRN

## 2022-06-13 MED ORDER — LACTATED RINGERS IV SOLN: 500.0000 mL | INTRAVENOUS | Status: DC | PRN

## 2022-06-13 MED ORDER — SENNOSIDES-DOCUSATE SODIUM 8.6-50 MG PO TABS
2.0000 | ORAL_TABLET | Freq: Every day | ORAL | Status: DC
Start: 2022-06-14 — End: 2022-06-15
  Administered 2022-06-14 – 2022-06-15 (×2): 2 via ORAL
  Filled 2022-06-13 (×2): qty 2

## 2022-06-13 MED ORDER — OXYCODONE-ACETAMINOPHEN 5-325 MG PO TABS: 1.0000 | ORAL_TABLET | ORAL | Status: DC | PRN

## 2022-06-13 MED ORDER — EPHEDRINE 5 MG/ML INJ: 10.0000 mg | INTRAVENOUS | Status: DC | PRN

## 2022-06-13 MED ORDER — COCONUT OIL OIL
1.0000 | TOPICAL_OIL | Status: DC | PRN
Start: 2022-06-13 — End: 2022-06-15

## 2022-06-13 MED ORDER — TERBUTALINE SULFATE 1 MG/ML IJ SOLN: 0.2500 mg | Freq: Once | INTRAMUSCULAR | Status: DC | PRN

## 2022-06-13 MED ORDER — DIPHENHYDRAMINE HCL 25 MG PO CAPS: 25.0000 mg | ORAL_CAPSULE | Freq: Four times a day (QID) | ORAL | Status: DC | PRN

## 2022-06-13 MED ORDER — BENZOCAINE-MENTHOL 20-0.5 % EX AERO: 1.0000 | INHALATION_SPRAY | CUTANEOUS | Status: DC | PRN

## 2022-06-13 MED ORDER — ONDANSETRON HCL 4 MG/2ML IJ SOLN: 4.0000 mg | INTRAMUSCULAR | Status: DC | PRN

## 2022-06-13 MED ORDER — ACETAMINOPHEN 325 MG PO TABS: 650.0000 mg | ORAL_TABLET | ORAL | Status: DC | PRN

## 2022-06-13 MED ORDER — OXYCODONE-ACETAMINOPHEN 5-325 MG PO TABS: 2.0000 | ORAL_TABLET | ORAL | Status: DC | PRN

## 2022-06-13 MED ORDER — LACTATED RINGERS IV SOLN: INTRAVENOUS | Status: DC

## 2022-06-13 MED ORDER — TRANEXAMIC ACID-NACL 1000-0.7 MG/100ML-% IV SOLN
INTRAVENOUS | Status: AC
  Filled 2022-06-13: qty 100

## 2022-06-13 MED ORDER — IBUPROFEN 600 MG PO TABS
600.0000 mg | ORAL_TABLET | Freq: Four times a day (QID) | ORAL | Status: DC
  Administered 2022-06-13 – 2022-06-15 (×7): 600 mg via ORAL
  Filled 2022-06-13 (×7): qty 1

## 2022-06-13 MED ORDER — OXYTOCIN BOLUS FROM INFUSION
333.0000 mL | Freq: Once | INTRAVENOUS | Status: AC
  Administered 2022-06-13: 333 mL via INTRAVENOUS

## 2022-06-13 MED ORDER — SIMETHICONE 80 MG PO CHEW: 80.0000 mg | CHEWABLE_TABLET | ORAL | Status: DC | PRN

## 2022-06-13 MED ORDER — SOD CITRATE-CITRIC ACID 500-334 MG/5ML PO SOLN: 30.0000 mL | ORAL | Status: DC | PRN

## 2022-06-13 MED ORDER — FENTANYL-BUPIVACAINE-NACL 0.5-0.125-0.9 MG/250ML-% EP SOLN: 12.0000 mL/h | EPIDURAL | Status: DC | PRN

## 2022-06-13 MED ORDER — OXYTOCIN-SODIUM CHLORIDE 30-0.9 UT/500ML-% IV SOLN: 1.0000 m[IU]/min | INTRAVENOUS | Status: DC

## 2022-06-13 MED ORDER — ONDANSETRON HCL 4 MG/2ML IJ SOLN: 4.0000 mg | Freq: Four times a day (QID) | INTRAMUSCULAR | Status: DC | PRN

## 2022-06-13 NOTE — Anesthesia Postprocedure Evaluation (Signed)
Anesthesia Post Note  Patient: Toni Klein  Procedure(s) Performed: AN AD HOC LABOR EPIDURAL     Patient location during evaluation: Mother Baby Anesthesia Type: Epidural Level of consciousness: awake and alert Pain management: pain level controlled Vital Signs Assessment: post-procedure vital signs reviewed and stable Respiratory status: spontaneous breathing, nonlabored ventilation and respiratory function stable Cardiovascular status: stable Postop Assessment: no headache, no backache and epidural receding Anesthetic complications: no   No notable events documented.  Last Vitals:  Vitals:   06/13/22 1530 06/13/22 1600  BP: 124/73 118/67  Pulse: 95 94  Resp: 16 16  Temp:    SpO2:      Last Pain:  Vitals:   06/13/22 1500  TempSrc: Axillary  PainSc: 0-No pain   Pain Goal:                   Mimi Debellis

## 2022-06-13 NOTE — Anesthesia Procedure Notes (Signed)
Epidural Patient location during procedure: OB Start time: 06/13/2022 8:31 AM End time: 06/13/2022 8:37 AM  Staffing Anesthesiologist: Bethena Midget, MD  Preanesthetic Checklist Completed: patient identified, IV checked, site marked, risks and benefits discussed, surgical consent, monitors and equipment checked, pre-op evaluation and timeout performed  Epidural Patient position: sitting Prep: DuraPrep and site prepped and draped Patient monitoring: continuous pulse ox and blood pressure Approach: midline Location: L3-L4 Injection technique: LOR air  Needle:  Needle type: Tuohy  Needle gauge: 17 G Needle length: 9 cm and 9 Needle insertion depth: 5 cm Catheter type: closed end flexible Catheter size: 19 Gauge Catheter at skin depth: 10 cm Test dose: negative  Assessment Events: blood not aspirated, injection not painful, no injection resistance, no paresthesia and negative IV test

## 2022-06-13 NOTE — Progress Notes (Signed)
Labor Progress Note Citlally Abigail Carrah Eppolito is a 21 y.o. G4P0030 at [redacted]w[redacted]d who presented for IOL due to postdates.   S: Feeling contractions much more now. Very intense. Partner at bedside. No concerns.   O:  BP 113/73   Pulse 72   Temp 98.2 F (36.8 C) (Oral)   Ht 5\' 1"  (1.549 m)   Wt 59.6 kg   LMP 08/31/2021   BMI 24.83 kg/m   EFM: Baseline 135 bpm, moderate variability, + accels, no decels Toco: Every 1-3 minutes   CVE: Dilation: 2 Effacement (%): 70 Station: -2 Exam by:: Dr. [email or web address]  A&P: 21 y.o. Mathis Fare [redacted]w[redacted]d   #Labor: Progressing well s/p Cytotec x1. Pain recently much more intense. Contracting every 1-3 minutes. Will hold on further augmentation for now and reassess in 4 hours. Plan to augment with Pitocin if contractions space.  #Pain: PRN; declined for now #FWB: Cat 1  #GBS negative  #AMN video Spanish interpreter used for encounter.   [redacted]w[redacted]d, MD 5:36 AM

## 2022-06-13 NOTE — Progress Notes (Signed)
Toni Klein is a 21 y.o. G4P0030 at [redacted]w[redacted]d by LMP admitted for induction of labor due to postdates .  Subjective: Patient doing well.   Objective: BP 114/83   Pulse 91   Temp 97.6 F (36.4 C) (Axillary)   Resp 16   Ht 5\' 1"  (1.549 m)   Wt 59.6 kg   LMP 08/31/2021   SpO2 97%   BMI 24.83 kg/m  No intake/output data recorded. Total I/O In: 360 [P.O.:360] Out: 350 [Urine:350]  FHT:  FHR: 145 bpm, variability: moderate,  accelerations:  Present,  decelerations:  Absent UC:   regular, every 2-3 minutes SVE:   Dilation: 9 Effacement (%): 100 Station: 0 Exam by:: [email or web address], RN  Labs: Lab Results  Component Value Date   WBC 11.4 (H) 06/13/2022   HGB 13.1 06/13/2022   HCT 37.7 06/13/2022   MCV 88.7 06/13/2022   PLT 285 06/13/2022   Per RN "she had more than a little bloody show at last SVE." RN states no active bleeding. Pads were changed. CNM assessed bleeding. Pads underneath patient notes small quarter-size amount of bright red blood. Towels that were removed noted grapefruit sized blood stain mixed with mucus and brown discharge. RN states she 06/15/2022 uncertain if feeling a BOW on exam and she has not seen any other fluid.    Assessment / Plan: Induction of labor due to term,  progressing well s/p 1 cytotec. Continue to monitor bleeding. TXA requested at bedside for delivery.   Labor:  Patient progressed to 9cm. Continue with expectant management   Fetal Wellbeing:  Category II- Continue to monitor for signs of distress.  Pain Control:  Epidural I/D:  n/a Anticipated MOD:  NSVD  [place], CNM 06/13/2022, 3:25 PM

## 2022-06-13 NOTE — Discharge Summary (Shared)
Postpartum Discharge Summary  Date of Service updated***     Patient Name: Toni Klein DOB: 2001/01/01 MRN: [id number]  Date of admission: 06/13/2022 Delivery date:06/13/2022  Delivering provider: Fabiola Backer  Date of discharge: 06/14/2022  Admitting diagnosis: Post-dates pregnancy [O48.0] Intrauterine pregnancy: [redacted]w[redacted]d     Secondary diagnosis:  Principal Problem:   Post-dates pregnancy  Additional problems: none    Discharge diagnosis: Term Pregnancy Delivered                                              Post partum procedures: none Augmentation: Cytotec Complications: None  Hospital course: Induction of Labor With Vaginal Delivery   21 y.o. yo G4P0030 at [redacted]w[redacted]d was admitted to the hospital 06/13/2022 for induction of labor.  Indication for induction: Postdates.  Patient had an uncomplicated labor course as follows: Membrane Rupture Time/Date: 5:24 PM ,06/13/2022   Delivery Method:Vaginal, Spontaneous  Episiotomy: None  Lacerations:  2nd degree;Labial;Periurethral   Details of delivery can be found in separate delivery note.  Patient had a routine postpartum course. Patient is discharged home 06/14/22.  Newborn Data: Birth date:06/13/2022  Birth time:5:24 PM  Gender:Female  Living status:Living  Apgars:8 ,9  Weight:3300 g (7lb 4.4oz)  Magnesium Sulfate received: No BMZ received: No Rhophylac:N/A MMR:N/A T-DaP: declined prenatally Flu: No Transfusion:No  Physical exam  Vitals:   06/14/22 0200 06/14/22 0500 06/14/22 1400 06/14/22 2007  BP: 105/63 110/75 107/71 115/73  Pulse: 98 79 80 99  Resp: $Remo'16 18  18  'vSTtt$ Temp:    98.8 F (37.1 C)  TempSrc:    Oral  SpO2:  100%  98%  Weight:      Height:       General: alert, cooperative, and no distress Lochia: appropriate Uterine Fundus: firm Incision: N/A DVT Evaluation: No evidence of DVT seen on physical exam. No cords or calf tenderness. No significant calf/ankle edema. Labs: Lab Results   Component Value Date   WBC 11.9 (H) 06/14/2022   HGB 11.6 (L) 06/14/2022   HCT 32.8 (L) 06/14/2022   MCV 88.6 06/14/2022   PLT 273 06/14/2022      Latest Ref Rng & Units 11/22/2020    4:49 PM  CMP  Glucose 70 - 99 mg/dL 90   BUN 6 - 20 mg/dL 10   Creatinine 0.44 - 1.00 mg/dL 0.73   Sodium 135 - 145 mmol/L 139   Potassium 3.5 - 5.1 mmol/L 3.8   Chloride 98 - 111 mmol/L 106   CO2 22 - 32 mmol/L 23   Calcium 8.9 - 10.3 mg/dL 9.5   Total Protein 6.5 - 8.1 g/dL 6.5   Total Bilirubin 0.3 - 1.2 mg/dL 0.6   Alkaline Phos 38 - 126 U/L 58   AST 15 - 41 U/L 19   ALT 0 - 44 U/L 19    Edinburgh Score:    06/13/2022    8:35 PM  Edinburgh Postnatal Depression Scale Screening Tool  I have been able to laugh and see the funny side of things. 0  I have looked forward with enjoyment to things. 0  I have blamed myself unnecessarily when things went wrong. 0  I have been anxious or worried for no good reason. 0  I have felt scared or panicky for no good reason. 0  Things have been getting on top  of me. 1  I have been so unhappy that I have had difficulty sleeping. 0  I have felt sad or miserable. 0  I have been so unhappy that I have been crying. 0  The thought of harming myself has occurred to me. 0  Edinburgh Postnatal Depression Scale Total 1     After visit meds:  Allergies as of 06/14/2022   No Known Allergies   Med Rec must be completed prior to using this [facility]***        Discharge home in stable condition Infant Feeding: {Baby feeding:23562} Infant Disposition:{CHL IP OB HOME WITH YHOOIL:57972} Discharge instruction: per After Visit Summary and Postpartum booklet. Activity: Advance as tolerated. Pelvic rest for 6 weeks.  Diet: routine diet Future Appointments:No future appointments. Follow up Visit:  Follow-up Information     Department, [facility] Schedule an appointment as soon as possible for a visit in 4 week(s).   Why: For your postpartum  appointment Contact information: [place] [address] [phone]                  Patient followed by [facility], will call for appointment for a postpartum visit in 6 weeks with a provider.   06/14/2022 Myrtis Ser, CNM

## 2022-06-13 NOTE — Progress Notes (Addendum)
Labor Progress Note Toni Klein is a 21 y.o. G4P0030 at [redacted]w[redacted]d presented for IOL for postdates.  S: RN and virtual interpreter at bedside. Comfortable with epidural. States that she has some pressure along her lower abdomen. No other acute concerns at this time.   O:  BP 119/67   Pulse 73   Temp 98.2 F (36.8 C) (Oral)   Resp 16   Ht 5\' 1"  (1.549 m)   Wt 59.6 kg   LMP 08/31/2021   SpO2 97%   BMI 24.83 kg/m  EFM: 130 bpm/moderate variability/+accels, no decels  CVE: Dilation: 4 Effacement (%): 90 Cervical Position: Middle Station: Plus 1 Presentation: Vertex Exam by:: [email or web address], RN   A&P: 21 y.o. G4P0030 [redacted]w[redacted]d IOL for postdates #Labor: Progressing well. Good CTX pattern without augmentation of labor. Continue expectant management. Plan for augmentation if needed.   #Pain: Epidural #FWB: Cat 1- Continue to monitor for signs of fetal distress.  #GBS negative   [redacted]w[redacted]d, MD 9:36 AM  I confirm that I have verified and agree with the information documented in the resident's note.    Raylene Everts, CNM 06/13/2022 10:39 AM

## 2022-06-13 NOTE — Lactation Note (Signed)
This note was copied from a baby's chart. Lactation Consultation Note  Patient Name: Toni Klein Today's Date: 06/13/2022   Age:20 hours  LC attempted to assist with first feeding.  RN stated it wasn't a good time and she would call LC.       Toni Klein 06/13/2022, 6:15 PM

## 2022-06-13 NOTE — Progress Notes (Addendum)
Toni Klein is a 21 y.o. G4P0030 at [redacted]w[redacted]d by LMP admitted for IOL for Postdates.   Subjective: Patient resting well with epidural.   Objective: BP 124/84   Pulse 92   Temp 98.2 F (36.8 C) (Oral)   Resp 16   Ht 5\' 1"  (1.549 m)   Wt 59.6 kg   LMP 08/31/2021   SpO2 97%   BMI 24.83 kg/m  No intake/output data recorded. Total I/O In: 120 [P.O.:120] Out: 350 [Urine:350]  FHT:  FHR: 135 bpm, variability: moderate,  accelerations:  Present,  decelerations:  Present occasional variable decels with quick return to baseline.  UC:   regular, every 3-5 minutes SVE:   Dilation: 6 Effacement (%): 90 Station: 0 Exam by:: [email or web address], RN  Labs: Lab Results  Component Value Date   WBC 11.4 (H) 06/13/2022   HGB 13.1 06/13/2022   HCT 37.7 06/13/2022   MCV 88.7 06/13/2022   PLT 285 06/13/2022    Assessment / Plan: IOL for postdates, progressing normally s/p 1 cytotec.   Labor: Progressing normally- continue to monitor.  Fetal Wellbeing:  Category II continue to monitor for signs of fetal distress.  Pain Control:  Epidural pain well controlled at this time.  I/D:   GBS Neg  Anticipated MOD:  NSVD  06/15/2022, CNM 06/13/2022, 1:43 PM

## 2022-06-13 NOTE — Anesthesia Preprocedure Evaluation (Signed)
Anesthesia Evaluation  Patient identified by MRN, date of birth, ID band Patient awake    Reviewed: Allergy & Precautions, H&P , NPO status , Patient's Chart, lab work & pertinent test results, reviewed documented beta blocker date and time   Airway Mallampati: I  TM Distance: >3 FB Neck ROM: full    Dental no notable dental hx. (+) Teeth Intact, Dental Advisory Given   Pulmonary neg pulmonary ROS,    Pulmonary exam normal breath sounds clear to auscultation       Cardiovascular negative cardio ROS Normal cardiovascular exam Rhythm:regular Rate:Normal     Neuro/Psych negative neurological ROS  negative psych ROS   GI/Hepatic negative GI ROS, Neg liver ROS,   Endo/Other  negative endocrine ROS  Renal/GU negative Renal ROS  negative genitourinary   Musculoskeletal   Abdominal   Peds  Hematology negative hematology ROS (+)   Anesthesia Other Findings   Reproductive/Obstetrics (+) Pregnancy                             Anesthesia Physical Anesthesia Plan  ASA: 2  Anesthesia Plan: Epidural   Post-op Pain Management:    Induction:   PONV Risk Score and Plan: 2  Airway Management Planned: Natural Airway  Additional Equipment: None  Intra-op Plan:   Post-operative Plan:   Informed Consent: I have reviewed the patients History and Physical, chart, labs and discussed the procedure including the risks, benefits and alternatives for the proposed anesthesia with the patient or authorized representative who has indicated his/her understanding and acceptance.     Dental Advisory Given  Plan Discussed with: Anesthesiologist  Anesthesia Plan Comments: (Labs checked- platelets confirmed with RN in room. Fetal heart tracing, per RN, reported to be stable enough for sitting procedure. Discussed epidural, and patient consents to the procedure:  included risk of possible headache,backache,  failed block, allergic reaction, and nerve injury. This patient was asked if she had any questions or concerns before the procedure started.)        Anesthesia Quick Evaluation

## 2022-06-14 LAB — CBC
HCT: 32.8 % — ABNORMAL LOW (ref 36.0–46.0)
Hemoglobin: 11.6 g/dL — ABNORMAL LOW (ref 12.0–15.0)
MCH: 31.4 pg (ref 26.0–34.0)
MCHC: 35.4 g/dL (ref 30.0–36.0)
MCV: 88.6 fL (ref 80.0–100.0)
Platelets: 273 10*3/uL (ref 150–400)
RBC: 3.7 MIL/uL — ABNORMAL LOW (ref 3.87–5.11)
RDW: 12.9 % (ref 11.5–15.5)
WBC: 11.9 10*3/uL — ABNORMAL HIGH (ref 4.0–10.5)
nRBC: 0 % (ref 0.0–0.2)

## 2022-06-14 NOTE — Progress Notes (Signed)
Ordered meals for pt by Orlan Leavens Spanish Interpreter.

## 2022-06-14 NOTE — Progress Notes (Signed)
POSTPARTUM PROGRESS NOTE  Post Partum Day #1 s/p SVD  Subjective:  Toni Klein is a 21 y.o. [phone] s/p SVD at [redacted]w[redacted]d.  No acute events overnight.  Pt denies problems with ambulating, voiding or po intake.  She denies nausea or vomiting.  Pain is well controlled.  She has had flatus. She has not had bowel movement.  Lochia minimal.   Objective: Blood pressure 110/75, pulse 79, temperature 98.4 F (36.9 C), temperature source Oral, resp. rate 18, height 5\' 1"  (1.549 m), weight 59.6 kg, last menstrual period 08/31/2021, SpO2 100 %, unknown if currently breastfeeding.  Physical Exam:  General: alert, cooperative and no distress Chest: no respiratory distress Heart:regular rate, distal pulses intact Abdomen: soft, nontender, nondistended Uterine Fundus: firm, appropriately tender DVT Evaluation: No calf swelling or tenderness Extremities: No peripheral edema Skin: warm, dry  Recent Labs    06/13/22 0019 06/14/22 0507  HGB 13.1 11.6*  HCT 37.7 32.8*    Assessment/Plan: Toni Abigail Dalaya Suppa is a 21 y.o. [phone] s/p SVD at [redacted]w[redacted]d   PPD#1 - Doing well, continue routine postpartum care Contraception: Undecided Feeding: Breast/bottle Dispo: Plan for discharge PPD#2 pending newborn course   LOS: 1 day   [redacted]w[redacted]d, MD 06/14/2022, 8:01 AM

## 2022-06-14 NOTE — Lactation Note (Addendum)
This note was copied from a baby's chart. Lactation Consultation Note  Patient Name: Toni Klein Today's Date: 06/14/2022 Reason for consult: Follow-up assessment;1st time breastfeeding Age:20 hours  Spanish interpreter used via video. P1, Birthing parent plans to breastfeed and formula feed. Reviewed hand expression and assisted with latching. Encouraged breastfeeding before offering formula. Provided volume guidelines. Feed on demand with cues.  Goal 8-12+ times per day after first 24 hrs.  Place baby STS if not cueing.  Family made aware of O/P services, breastfeeding support groups, and our phone # for post-discharge questions.    Maternal Data Has patient been taught Hand Expression?: Yes Does the patient have breastfeeding experience prior to this delivery?: No  Feeding Mother's Current Feeding Choice: Breast Milk and Formula  LATCH Score Latch: Grasps breast easily, tongue down, lips flanged, rhythmical sucking.  Audible Swallowing: A few with stimulation  Type of Nipple: Everted at rest and after stimulation  Comfort (Breast/Nipple): Soft / non-tender  Hold (Positioning): Assistance needed to correctly position infant at breast and maintain latch.  LATCH Score: 8   Interventions Interventions: Breast feeding basics reviewed;Assisted with latch;Skin to skin;Hand express;Education;LC Services brochure   Consult Status Consult Status: Follow-up Date: 06/15/22 Follow-up type: In-patient    Toni Klein [facility] 06/14/2022, 9:49 AM

## 2022-06-15 MED ORDER — IBUPROFEN 600 MG PO TABS: 600.0000 mg | ORAL_TABLET | Freq: Four times a day (QID) | ORAL | 0 refills | Status: DC | PRN

## 2022-06-15 NOTE — Progress Notes (Signed)
Discharge instructions, safe sleep education, and follow-up appointment reminders discussed with pt via Spanish InterpreterJudie Grieve [phone].

## 2022-06-15 NOTE — Lactation Note (Signed)
This note was copied from a baby's chart. Lactation Consultation Note  Patient Name: Toni Klein Today's Date: 06/15/2022 Reason for consult: Other (Comment) (all bottles since the 1st LC visit,) Age:20 hours  Maternal Data    Feeding Mother's Current Feeding Choice: Breast Milk and Formula  LATCH Score                    Lactation Tools Discussed/Used    Interventions    Discharge    Consult Status Consult Status: Complete Date: 06/15/22    Kathrin Greathouse 06/15/2022, 2:10 PM

## 2022-06-21 ENCOUNTER — Telehealth (HOSPITAL_COMMUNITY): Payer: Self-pay | Admitting: *Deleted

## 2022-06-21 NOTE — Telephone Encounter (Signed)
Attempted discharge follow-up call with language line interpreter, Alecia Lemming [phone]. No answer received. Deforest Hoyles, RN, 06/21/22, [phone]

## 2022-09-18 ENCOUNTER — Emergency Department (HOSPITAL_COMMUNITY)
Admission: EM | Admit: 2022-09-18 | Discharge: 2022-09-19 | Discharge status: Against Medical Advice | Payer: Medicaid Other

## 2022-09-18 NOTE — ED Notes (Signed)
PT called X2 for triage no response

## 2022-09-19 ENCOUNTER — Emergency Department (HOSPITAL_COMMUNITY)
Admission: EM | Admit: 2022-09-19 | Discharge: 2022-09-20 | Discharge status: Against Medical Advice | Payer: Self-pay | Attending: Emergency Medicine | Admitting: Emergency Medicine

## 2022-09-19 ENCOUNTER — Other Ambulatory Visit: Payer: Self-pay

## 2022-09-19 ENCOUNTER — Encounter (HOSPITAL_COMMUNITY): Payer: Self-pay | Admitting: Emergency Medicine

## 2022-09-19 DIAGNOSIS — H9209 Otalgia, unspecified ear: Secondary | ICD-10-CM | POA: Insufficient documentation

## 2022-09-19 DIAGNOSIS — Z5321 Procedure and treatment not carried out due to patient leaving prior to being seen by health care provider: Secondary | ICD-10-CM | POA: Insufficient documentation

## 2022-09-19 LAB — BASIC METABOLIC PANEL
Anion gap: 17 — ABNORMAL HIGH (ref 5–15)
BUN: 6 mg/dL (ref 6–20)
CO2: 18 mmol/L — ABNORMAL LOW (ref 22–32)
Calcium: 9.6 mg/dL (ref 8.9–10.3)
Chloride: 104 mmol/L (ref 98–111)
Creatinine, Ser: 0.53 mg/dL (ref 0.44–1.00)
GFR, Estimated: >60 mL/min (ref >60)
Glucose, Bld: 91 mg/dL (ref 70–99)
Potassium: 3.5 mmol/L (ref 3.5–5.1)
Sodium: 139 mmol/L (ref 135–145)

## 2022-09-19 LAB — I-STAT BETA HCG BLOOD, ED (MC, WL, AP ONLY): I-stat hCG, quantitative: <5 m[IU]/mL (ref <5)

## 2022-09-19 LAB — CBC
HCT: 39 % (ref 36.0–46.0)
Hemoglobin: 13.6 g/dL (ref 12.0–15.0)
MCH: 29.7 pg (ref 26.0–34.0)
MCHC: 34.9 g/dL (ref 30.0–36.0)
MCV: 85.2 fL (ref 80.0–100.0)
Platelets: 323 10*3/uL (ref 150–400)
RBC: 4.58 MIL/uL (ref 3.87–5.11)
RDW: 12.3 % (ref 11.5–15.5)
WBC: 11.3 10*3/uL — ABNORMAL HIGH (ref 4.0–10.5)
nRBC: 0 % (ref 0.0–0.2)

## 2022-09-19 MED ORDER — ACETAMINOPHEN 325 MG PO TABS
650.0000 mg | ORAL_TABLET | Freq: Once | ORAL | Status: AC
  Administered 2022-09-19: 650 mg via ORAL
  Filled 2022-09-19: qty 2

## 2022-09-19 NOTE — ED Triage Notes (Signed)
Patient reports chronic ( 3 months ) bilateral ear ache with pain , itching and purulent drainage .

## 2022-09-19 NOTE — ED Provider Triage Note (Signed)
Emergency Medicine Provider Triage Evaluation Note  Toni Klein , a 21 y.o. female  was evaluated in triage.  Pt complains of chronic ear pain and drainage over the last year.  Has never been seen for this issue.  Drainage described as whitish-yellow, pus like.  Denies fevers, URI symptoms, chills, or sore throat.  Has been trying to relieve the pain with ibuprofen and Tylenol, has also been sticking Q-tips and her long acrylic nails in her ear to relieve some of the pus.  Usually provides temporary relief.  However significant worsening of pain over the last 5 days.  Denies hearing loss.  Review of Systems  Positive:  Negative: See above  Physical Exam  BP (!) 134/94 (BP Location: Right Arm)   Pulse (!) 121   Temp 99.8 F (37.7 C) (Oral)   Resp 16   LMP 08/13/2022   SpO2 100%  Gen:   Awake, no distress   Resp:  Normal effort  MSK:   Moves extremities without difficulty  Other:  Whitish-yellow discharge appreciated in bilateral ear canals.  Significant tenderness of ear canals on exam.  No evidence of hemorrhage.  TMs do not appear perforated.  Medical Decision Making  Medically screening exam initiated at 9:25 PM.  Appropriate orders placed.  Toni Klein was informed that the remainder of the evaluation will be completed by another provider, this initial triage assessment does not replace that evaluation, and the importance of remaining in the ED until their evaluation is complete.  Tylenol ordered.   Prince Rome, PA-C 11/28/30 2127

## 2022-09-19 NOTE — ED Notes (Signed)
Pt refused vitals 

## 2022-09-20 MED ORDER — ACETAMINOPHEN 500 MG PO TABS: 1000.0000 mg | ORAL_TABLET | Freq: Once | ORAL | Status: DC

## 2022-09-20 NOTE — ED Notes (Signed)
Pt called for VS x3. No response

## 2022-12-13 IMAGING — US US OB < 14 WEEKS - US OB TV
1 series · 15 of 28 positions shown · non-contrast
Comparison: None available.

CLINICAL DATA: Initial evaluation for acute pain, early pregnancy.

EXAM:
OBSTETRIC <14 WK US AND TRANSVAGINAL OB US
TECHNIQUE: Both transabdominal and transvaginal ultrasound examinations were
performed for complete evaluation of the gestation as well as the
maternal uterus, adnexal regions, and pelvic cul-de-sac.
Transvaginal technique was performed to assess early pregnancy.

[Series 1: us ob < 14 weeks - us ob tv · 60 acquisitions, 15 frames shown]
[im 1/60]
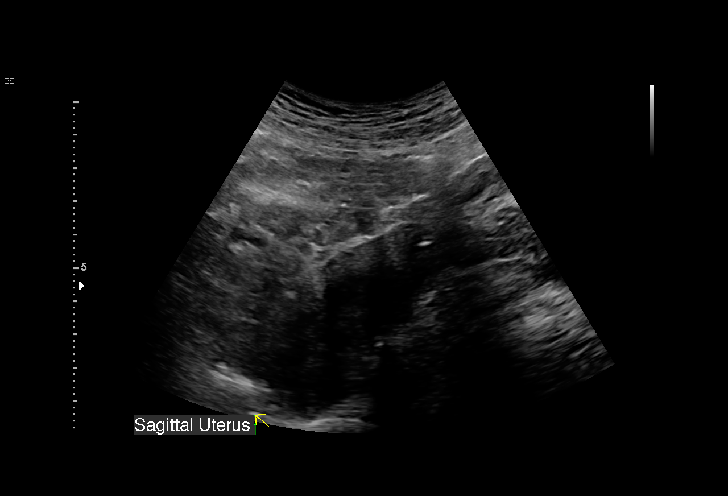
[im 5/60]
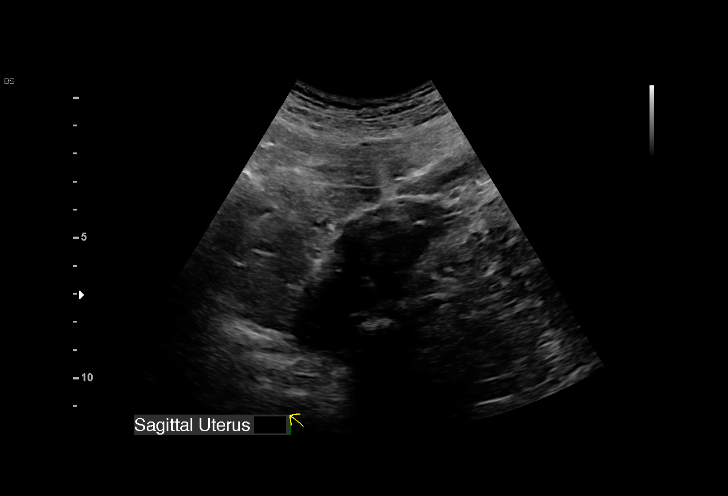
[im 9/60]
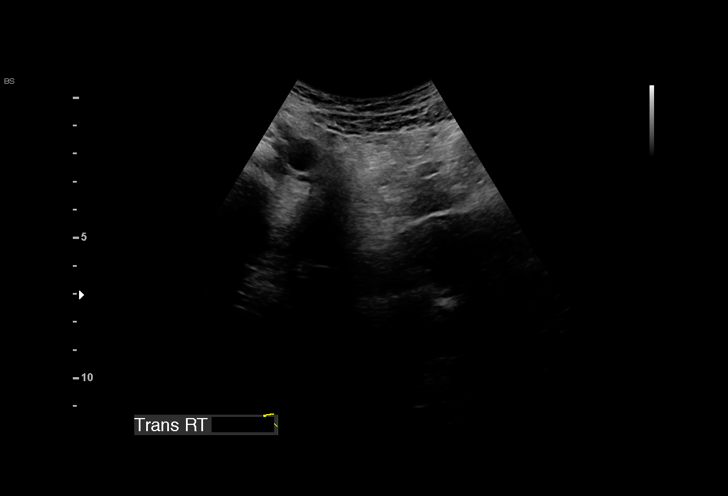
[im 14/60]
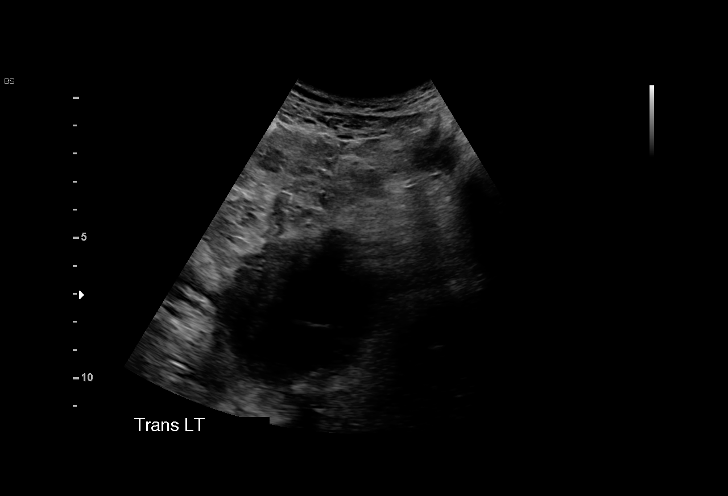
[im 18/60]
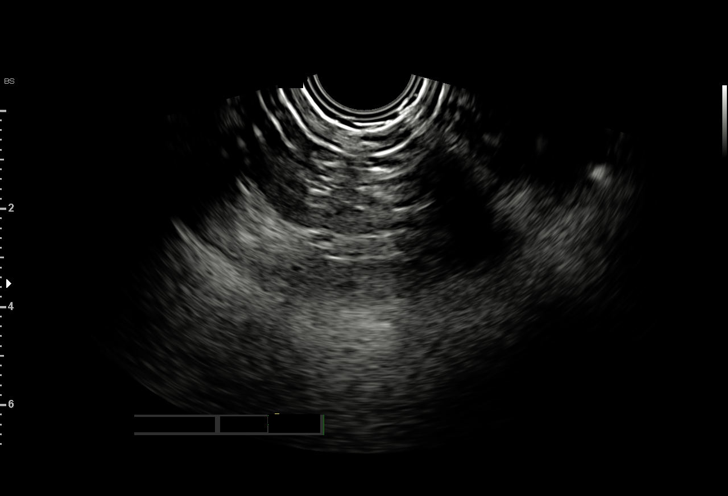
[im 22/60]
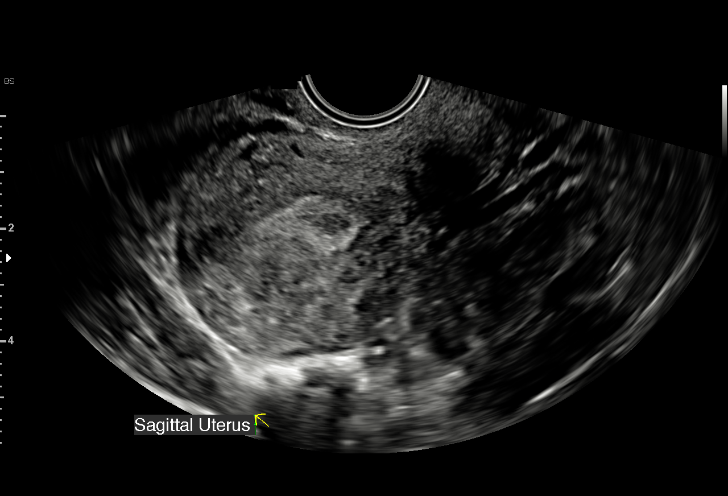
[im 27/60]
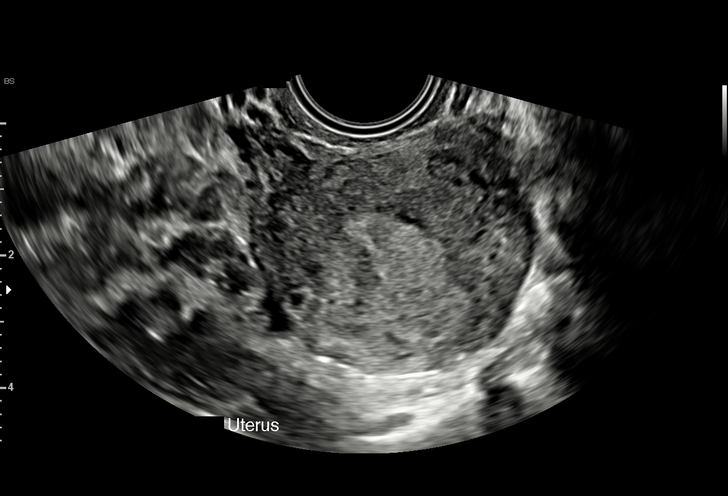
[im 31/60]
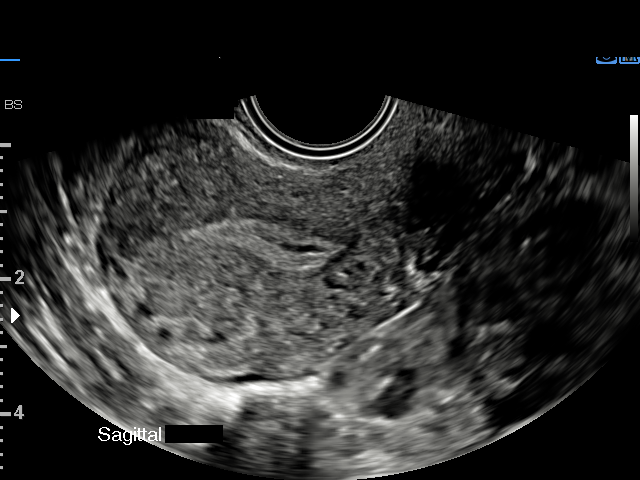
[im 33/60]
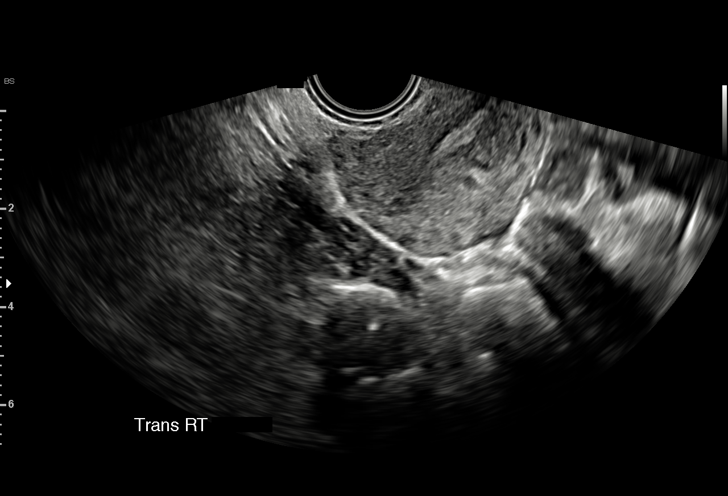
[im 38/60]
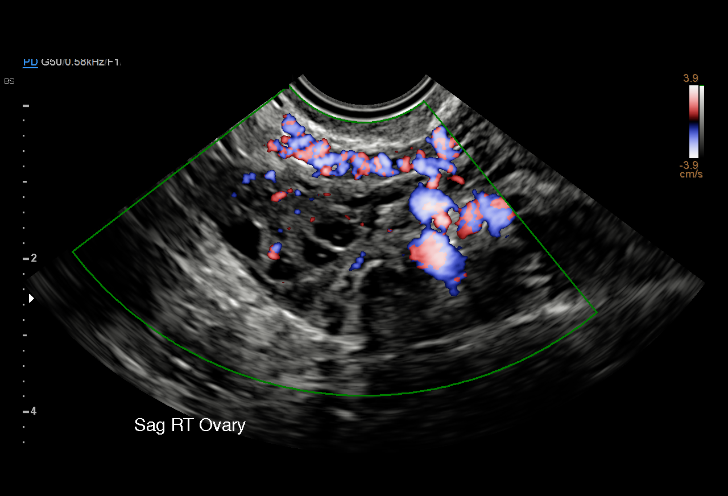
[im 42/60]
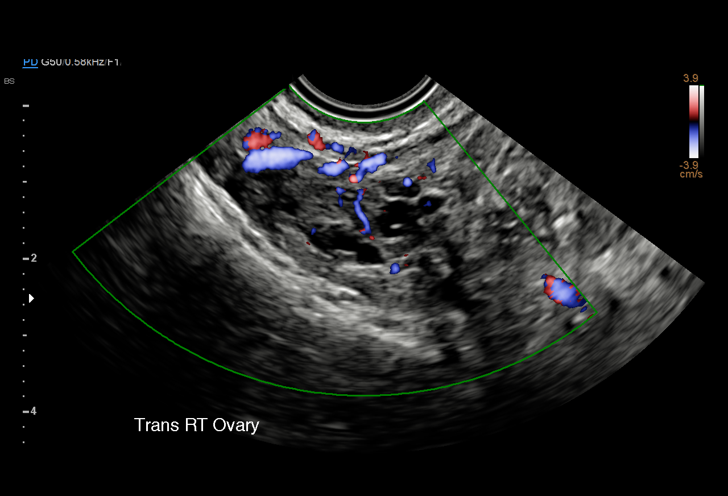
[im 46/60]
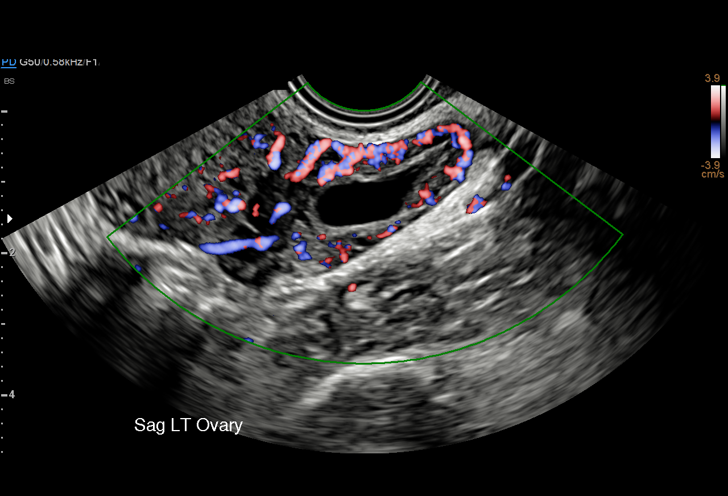
[im 51/60]
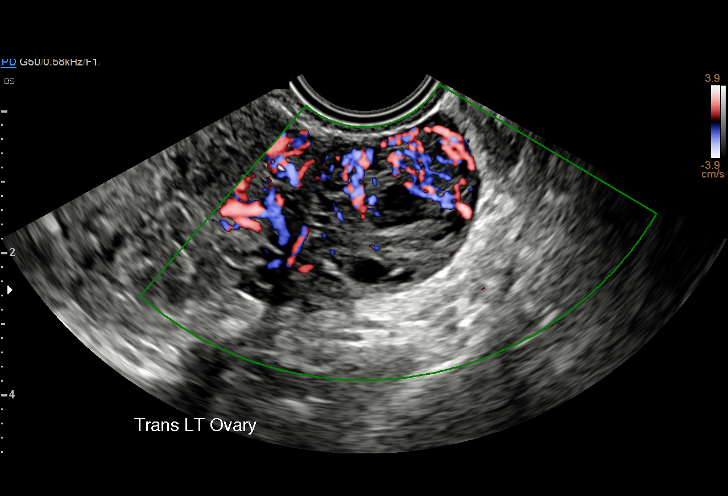
[im 55/60]
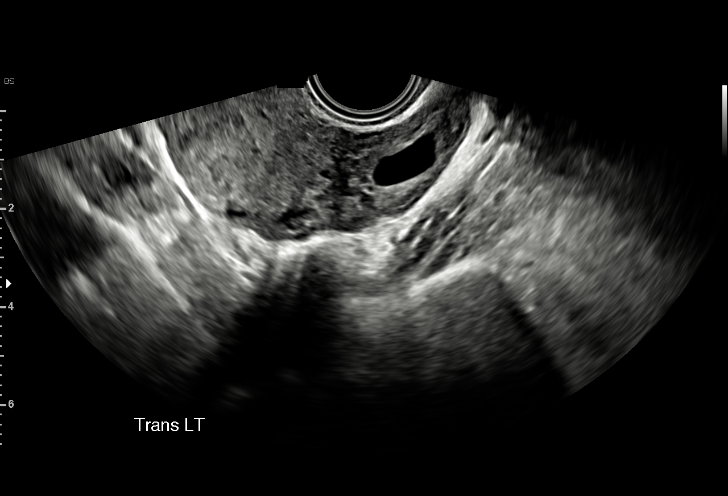
[im 60/60]
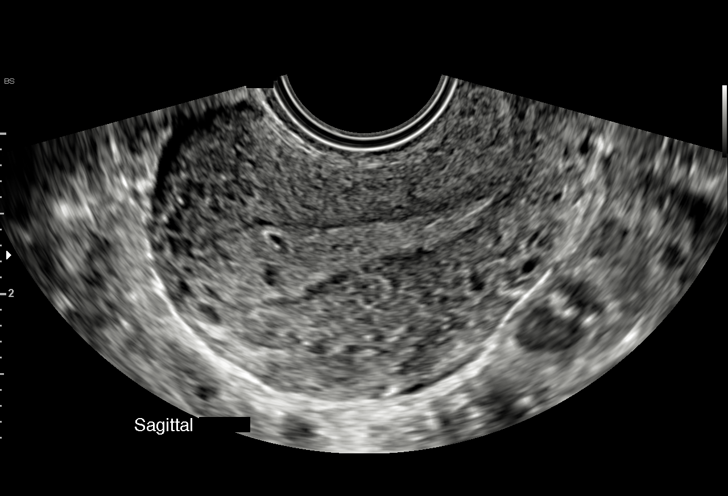

[15 of 28 positions shown; findings below may reference images not displayed]

FINDINGS: Intrauterine gestational sac: Negative. Endometrial stripe mildly
heterogeneous with a few scattered internal anechoic areas.

Yolk sac:  Negative.

Embryo:  Negative.

Cardiac Activity: Negative.

Heart Rate: N/A

Subchorionic hemorrhage:  None visualized.

Maternal uterus/adnexae: Right ovary within normal limits. 2.2 x
x 1.2 cm simple cyst with peripherally increased vascularity seen
within the left ovary, most consistent with a degenerating corpus
luteal cyst. No other adnexal mass or free fluid.
IMPRESSION: 1. Early pregnancy with no discrete IUP or adnexal mass identified.
Finding is consistent with a pregnancy of unknown anatomic location.
Differential considerations include IUP to early to visualize,
recent SAB, or possibly occult ectopic pregnancy. Close clinical
monitoring with serial beta HCGs and close interval follow-up
ultrasound recommended as clinically warranted.
2. 2.2 cm degenerating left ovarian corpus luteal cyst.
3. No other acute maternal uterine or adnexal abnormality.

## 2023-10-22 LAB — CULTURE, OB URINE: Urine Culture, OB: NEGATIVE

## 2023-10-22 LAB — OB RESULTS CONSOLE RUBELLA ANTIBODY, IGM: Rubella: IMMUNE

## 2023-10-22 LAB — OB RESULTS CONSOLE HGB/HCT, BLOOD
HCT: 38 (ref 29–41)
Hemoglobin: 13.1

## 2023-10-22 LAB — HEPATITIS C ANTIBODY: HCV Ab: NEGATIVE

## 2023-10-22 LAB — OB RESULTS CONSOLE HEPATITIS B SURFACE ANTIGEN: Hepatitis B Surface Ag: NEGATIVE

## 2023-10-22 LAB — OB RESULTS CONSOLE HIV ANTIBODY (ROUTINE TESTING): HIV: NONREACTIVE

## 2023-10-22 LAB — OB RESULTS CONSOLE GC/CHLAMYDIA
Chlamydia: NEGATIVE
Neisseria Gonorrhea: NEGATIVE

## 2023-10-22 LAB — OB RESULTS CONSOLE PLATELET COUNT: Platelets: 317

## 2023-10-22 LAB — OB RESULTS CONSOLE RPR: RPR: NONREACTIVE

## 2023-10-26 LAB — CYTOLOGY - PAP: Pap: ABNORMAL — AB

## 2023-10-30 ENCOUNTER — Other Ambulatory Visit: Payer: Self-pay | Admitting: Obstetrics & Gynecology

## 2023-10-30 DIAGNOSIS — Z363 Encounter for antenatal screening for malformations: Secondary | ICD-10-CM

## 2023-10-31 ENCOUNTER — Encounter (HOSPITAL_COMMUNITY): Payer: Self-pay | Admitting: Emergency Medicine

## 2023-11-19 ENCOUNTER — Other Ambulatory Visit: Payer: Self-pay

## 2023-11-19 ENCOUNTER — Encounter: Payer: Self-pay | Admitting: Advanced Practice Midwife

## 2023-11-19 ENCOUNTER — Ambulatory Visit: Payer: Self-pay | Admitting: Advanced Practice Midwife

## 2023-11-19 VITALS — BP 125/73 | HR 97 | Wt 105.3 lb

## 2023-11-19 DIAGNOSIS — Z3A18 18 weeks gestation of pregnancy: Secondary | ICD-10-CM

## 2023-11-19 DIAGNOSIS — Z3492 Encounter for supervision of normal pregnancy, unspecified, second trimester: Secondary | ICD-10-CM

## 2023-11-19 DIAGNOSIS — Z3490 Encounter for supervision of normal pregnancy, unspecified, unspecified trimester: Secondary | ICD-10-CM | POA: Insufficient documentation

## 2023-11-19 DIAGNOSIS — Z1332 Encounter for screening for maternal depression: Secondary | ICD-10-CM

## 2023-11-19 DIAGNOSIS — R768 Other specified abnormal immunological findings in serum: Secondary | ICD-10-CM

## 2023-11-19 MED ORDER — ASPIRIN 81 MG PO TBEC: 81.0000 mg | DELAYED_RELEASE_TABLET | Freq: Every day | ORAL | 5 refills | Status: DC

## 2023-11-19 NOTE — Progress Notes (Signed)
Subjective:   Toni Klein is a 22 y.o. [phone] at [redacted]w[redacted]d by LMP being seen today for her first obstetrical visit.  Her obstetrical history is significant for  pregnancy loss x 3 and ANA positive this pregnancy  and has Encounter for supervision of low-risk pregnancy, antepartum and ANA positive on their problem list.. Patient does intend to breast feed. Pregnancy history fully reviewed.  Patient reports  no complaints .  HISTORY: OB History  Gravida Para Term Preterm AB Living  6 1 1  0 4 1  SAB IAB Ectopic Multiple Live Births  3 0 1 0 1    # Outcome Date GA Lbr Len/2nd Weight Sex Type Anes PTL Lv  6 Current           5 Term 06/13/22 [redacted]w[redacted]d 10:24 / 00:36 7 lb 4.4 oz (3.3 kg) F Vag-Spont EPI  LIV     Name: AVILA MENDOZA,GIRL Toni ABIGAIL     Apgar1: 8  Apgar5: 9  4 Ectopic 11/2020          3 SAB 02/2020     SAB     2 SAB 11/2018          1 SAB 11/2017     SAB      Past Medical History:  Diagnosis Date   Medical history non-contributory    Past Surgical History:  Procedure Laterality Date   NO PAST SURGERIES     TOOTH EXTRACTION     Family History  Problem Relation Age of Onset   Healthy Mother    Healthy Father    Social History   Tobacco Use   Smoking status: Never   Smokeless tobacco: Never  Vaping Use   Vaping status: Never Used  Substance Use Topics   Alcohol use: Never   Drug use: Never   No Known Allergies Current Outpatient Medications on File Prior to Visit  Medication Sig Dispense Refill   cholecalciferol (VITAMIN D3) 25 MCG (1000 UNIT) tablet Take 1,000 Units by mouth daily.     doxylamine, Sleep, (UNISOM) 25 MG tablet Take 25 mg by mouth at bedtime as needed.     Prenatal Vit-Fe Fumarate-FA (MULTIVITAMIN-PRENATAL) 27-0.8 MG TABS tablet Take 1 tablet by mouth daily at 12 noon.     No current facility-administered medications on file prior to visit.     Indications for ASA therapy (per uptodate) One of the  following: Previous pregnancy with preeclampsia, especially early onset and with an adverse outcome No Multifetal gestation No Chronic hypertension No Type 1 or 2 diabetes mellitus No Chronic kidney disease No Autoimmune disease (antiphospholipid syndrome, systemic lupus erythematosus) Yes   Two or more of the following: Nulliparity No Obesity (body mass index >30 kg/m2) No Family history of preeclampsia in mother or sister No Age >=35 years No Sociodemographic characteristics (African American race, low socioeconomic level) No Personal risk factors (eg, previous pregnancy with low birth weight or small for gestational age infant, previous adverse pregnancy outcome [eg, stillbirth], interval >10 years between pregnancies) No   Indications for early 1 hour GTT (per uptodate)  BMI >25 (>23 in Asian women) AND one of the following  No BMI 21  Exam   Vitals:   11/19/23 1401  BP: 125/73  Pulse: 97  Weight: 105 lb 4.8 oz (47.8 kg)   Fetal Heart Rate (bpm): 156  Uterus:     Pelvic Exam: Perineum: no hemorrhoids, normal perineum   Vulva: normal external genitalia,  no lesions   Vagina:  normal mucosa, normal discharge   Cervix: no lesions and normal, pap smear done.    Adnexa: normal adnexa and no mass, fullness, tenderness   Bony Pelvis: average  System: General: well-developed, well-nourished female in no acute distress   Breast:  normal appearance, no masses or tenderness   Skin: normal coloration and turgor, no rashes   Neurologic: oriented, normal, negative, normal mood   Extremities: normal strength, tone, and muscle mass, ROM of all joints is normal   HEENT PERRLA, extraocular movement intact and sclera clear, anicteric   Mouth/Teeth mucous membranes moist, pharynx normal without lesions and dental hygiene good   Neck supple and no masses   Cardiovascular: regular rate and rhythm   Respiratory:  no respiratory distress, normal breath sounds   Abdomen: soft, non-tender;  bowel sounds normal; no masses,  no organomegaly     Assessment:   Pregnancy: [id number] Patient Active Problem List   Diagnosis Date Noted   Encounter for supervision of low-risk pregnancy, antepartum 11/19/2023   ANA positive 11/19/2023     Plan:  1. [redacted] weeks gestation of pregnancy (Primary)   2. Encounter for supervision of low-risk pregnancy, antepartum --Anticipatory guidance about next visits/weeks of pregnancy given.   3. ANA positive --Pt without symptoms, initial Lupus testing negative at [facility].  High titer for ANA warrants follow up.  Consult Dr Macon Large.  Reviewed recommended work up for ANA positive and plan for pt to apply for [facility] discount and refer to MFM to determine best, most cost effective testing for patient in pregnancy  - aspirin EC 81 MG tablet; Take 1 tablet (81 mg total) by mouth daily. Swallow whole.  Dispense: 30 tablet; Refill: 5 - Consult to Maternal Fetal Care    Initial labs reviewed Continue prenatal vitamins. NIPS testing reviewed Ultrasound discussed; fetal anatomic survey: ordered. Problem list reviewed and updated. The nature of [facility] with multiple MDs and other Advanced Practice Providers was explained to patient; also emphasized that residents, students are part of our team. Routine obstetric precautions reviewed. Return in about 4 weeks (around 12/17/2023) for LOB.   Sharen Counter, CNM 11/19/23 6:27 PM

## 2023-11-21 DIAGNOSIS — Z5189 Encounter for other specified aftercare: Secondary | ICD-10-CM

## 2023-11-21 DIAGNOSIS — O1420 HELLP syndrome (HELLP), unspecified trimester: Secondary | ICD-10-CM

## 2023-11-21 HISTORY — DX: HELLP syndrome (HELLP), unspecified trimester: O14.20

## 2023-11-21 HISTORY — DX: Encounter for other specified aftercare: Z51.89

## 2023-12-04 ENCOUNTER — Other Ambulatory Visit: Payer: Self-pay

## 2023-12-04 ENCOUNTER — Other Ambulatory Visit: Payer: Self-pay | Admitting: *Deleted

## 2023-12-04 ENCOUNTER — Ambulatory Visit: Payer: Self-pay | Attending: Obstetrics | Admitting: Obstetrics

## 2023-12-04 ENCOUNTER — Ambulatory Visit: Payer: Self-pay | Admitting: *Deleted

## 2023-12-04 ENCOUNTER — Ambulatory Visit: Payer: Self-pay | Attending: Obstetrics & Gynecology

## 2023-12-04 ENCOUNTER — Encounter: Payer: Self-pay | Admitting: *Deleted

## 2023-12-04 DIAGNOSIS — Z363 Encounter for antenatal screening for malformations: Secondary | ICD-10-CM | POA: Insufficient documentation

## 2023-12-04 DIAGNOSIS — O24812 Other pre-existing diabetes mellitus in pregnancy, second trimester: Secondary | ICD-10-CM

## 2023-12-04 DIAGNOSIS — Z362 Encounter for other antenatal screening follow-up: Secondary | ICD-10-CM

## 2023-12-04 DIAGNOSIS — Z3A20 20 weeks gestation of pregnancy: Secondary | ICD-10-CM

## 2023-12-04 DIAGNOSIS — O358XX0 Maternal care for other (suspected) fetal abnormality and damage, not applicable or unspecified: Secondary | ICD-10-CM

## 2023-12-04 NOTE — Progress Notes (Signed)
 MFM Note  Toni Klein is currently at 20 weeks and 2 days.  She was seen for a detailed fetal anatomy scan as she screened positive for antinuclear (ANA) antibodies.  She denies any history of lupus and denies any significant past medical history.  She also denies a personal history of thrombosis.  She reports that her last delivery in July 2023 was an uncomplicated vaginal delivery at 40 weeks and 6 days.  She denies any problems in her current pregnancy.    She has not had a screening test for fetal aneuploidy drawn in her current pregnancy.  She was informed that the fetal growth and amniotic fluid level were appropriate for her gestational age.   There were no obvious fetal anomalies noted on today's ultrasound exam.  However, today's exam was limited due to the fetal position.  The patient was informed that anomalies may be missed due to technical limitations. If the fetus is in a suboptimal position or maternal habitus is increased, visualization of the fetus in the maternal uterus may be impaired.  The following were discussed during today's consultation:  Positive ANA antibodies in pregnancy  The patient was advised that the ANA antibodies are nonspecific autoimmune antibodies.    It has been associated with lupus and rheumatoid arthritis.  As the patient does not have lupus or rheumatoid arthritis, she was reassured that most women who test positive for the ANA antibodies in pregnancy can expect a good/normal outcome.  As she may have undiagnosed lupus, she should continue taking a daily baby aspirin  for preeclampsia prophylaxis.    Due to the positive ANA antibodies, we will continue to follow her with growth ultrasounds throughout her pregnancy.    A follow-up exam was scheduled in 5 weeks.  We will complete the views of the fetal anatomy at that visit.    The patient stated that all of her questions were answered today.    All conversations were held  with the patient today with the help of a Spanish interpreter.  A total of 30 minutes was spent counseling and coordinating the care for this patient.  Greater than 50% of the time was spent in direct face-to-face contact.

## 2023-12-18 ENCOUNTER — Ambulatory Visit (INDEPENDENT_AMBULATORY_CARE_PROVIDER_SITE_OTHER): Payer: Self-pay | Admitting: Certified Nurse Midwife

## 2023-12-18 ENCOUNTER — Other Ambulatory Visit: Payer: Self-pay

## 2023-12-18 VITALS — BP 119/69 | HR 106 | Wt 114.2 lb

## 2023-12-18 DIAGNOSIS — R7689 Other specified abnormal immunological findings in serum: Secondary | ICD-10-CM

## 2023-12-18 DIAGNOSIS — R768 Other specified abnormal immunological findings in serum: Secondary | ICD-10-CM

## 2023-12-18 DIAGNOSIS — Z3490 Encounter for supervision of normal pregnancy, unspecified, unspecified trimester: Secondary | ICD-10-CM

## 2023-12-18 DIAGNOSIS — Z3492 Encounter for supervision of normal pregnancy, unspecified, second trimester: Secondary | ICD-10-CM

## 2023-12-18 DIAGNOSIS — Z758 Other problems related to medical facilities and other health care: Secondary | ICD-10-CM

## 2023-12-18 DIAGNOSIS — O219 Vomiting of pregnancy, unspecified: Secondary | ICD-10-CM

## 2023-12-18 DIAGNOSIS — Z3A22 22 weeks gestation of pregnancy: Secondary | ICD-10-CM

## 2023-12-18 DIAGNOSIS — Z603 Acculturation difficulty: Secondary | ICD-10-CM

## 2023-12-18 MED ORDER — ONDANSETRON HCL 4 MG PO TABS: 8.0000 mg | ORAL_TABLET | Freq: Two times a day (BID) | ORAL | 0 refills | Status: DC

## 2023-12-18 NOTE — Progress Notes (Signed)
   PRENATAL VISIT NOTE  Subjective:  Toni Klein is a 23 y.o. [phone] at [redacted]w[redacted]d being seen today for ongoing prenatal care.  She is currently monitored for the following issues for this low-risk pregnancy and has Encounter for supervision of low-risk pregnancy, antepartum and ANA positive on their problem list.  Patient reports nausea. Requesting medication, reports tolerable    . Vag. Bleeding: None.  Movement: Present. Denies leaking of fluid.   The following portions of the patient's history were reviewed and updated as appropriate: allergies, current medications, past family history, past medical history, past social history, past surgical history and problem list.   Objective:   Vitals:   12/18/23 1018  BP: 119/69  Pulse: (!) 106  Weight: 114 lb 3.2 oz (51.8 kg)    Fetal Status: Fetal Heart Rate (bpm): 140 Fundal Height: 22 cm Movement: Present     General:  Alert, oriented and cooperative. Patient is in no acute distress.  Skin: Skin is warm and dry. No rash noted.   Cardiovascular: Normal heart rate noted  Respiratory: Normal respiratory effort, no problems with respiration noted  Abdomen: Soft, gravid, appropriate for gestational age.  Pain/Pressure: Absent     Pelvic: Cervical exam deferred        Extremities: Normal range of motion.     Mental Status: Normal mood and affect. Normal behavior. Normal judgment and thought content.   Assessment and Plan:  Pregnancy: [id number] at [redacted]w[redacted]d  1. Encounter for supervision of low-risk pregnancy in second trimester (Primary)  - AFP, Serum, Open Spina Bifida  2. [redacted] weeks gestation of pregnancy -   3. ANA positive  - ASA compliant -MFM Consult: ANA nonspecific, will just need daily ASA and serial growth scans. No other intervention needed in absence of other conditions.  4. Language barrier -CH Spanish Int at bedside  5. Encounter for supervision of low-risk pregnancy, antepartum - NV GTT  - Spanish  information given written and verbally in presence of Spanish Interpreter  6. Nausea and vomiting during pregnancy  - ondansetron (ZOFRAN) 4 MG tablet; Take 2 tablets (8 mg total) by mouth 2 (two) times daily.  Dispense: 20 tablet; Refill: 0   Preterm labor symptoms and general obstetric precautions including but not limited to vaginal bleeding, contractions, leaking of fluid and fetal movement were reviewed in detail with the patient. Please refer to After Visit Summary for other counseling recommendations.   Return in about 4 weeks (around 01/15/2024) for LOB + GTT.  Future Appointments  Date Time Provider Department Center  01/08/2024  1:30 PM [facility] US2 [facility]    Marcell Barlow, MSN, [facility], Center for [employer]

## 2023-12-20 ENCOUNTER — Telehealth: Payer: Self-pay

## 2023-12-20 DIAGNOSIS — R772 Abnormality of alphafetoprotein: Secondary | ICD-10-CM

## 2023-12-20 LAB — AFP, SERUM, OPEN SPINA BIFIDA
AFP MoM: 3.82
AFP Value: 333.5 ng/mL
Gest. Age on Collection Date: 22 wk
Maternal Age At EDD: 22.2 a
OSBR Risk 1 IN: 33
Test Results:: POSITIVE — AB
Weight: 114 [lb_av]

## 2023-12-20 NOTE — Telephone Encounter (Addendum)
Called patient with in person Spanish interpreter Zoila, regarding physician's advised to get scheduled for [place] for positive AFP screening. Informed patient that per recommendation of provider, they would like for her to get an [place] for further evaluation based on her AFP results. Patient scheduled for this Monday, 12/31/23 at 7:15 AM. Patient confirmed scheduled appointment. RN also reviewed with patient her future appointments. 2/18 at 1330 for [place] with MFM and 2/28 at 0850 in our office for prenatal appointment and gtt labs. Patient informed to not eat or drink after midnight before her appointment. Patient voiced understanding.  Marcelino Duster, RN  ---- Can we get her scheduled for an Earlier [place] than 2 weeks? Concern for Open Spina Bifida. - Shay, CNM   -----  Ladona Ridgel from [facility] called to notify of patient's positive AFP lab and if [facility] needs to send a fax report to call at [phone] and press option 1 and then 2 to get to fax.

## 2023-12-25 ENCOUNTER — Other Ambulatory Visit: Payer: Self-pay

## 2023-12-25 DIAGNOSIS — R772 Abnormality of alphafetoprotein: Secondary | ICD-10-CM

## 2023-12-25 NOTE — Addendum Note (Signed)
Addended by: Carlynn Herald on: 12/25/2023 08:45 AM   Modules accepted: Orders

## 2023-12-31 ENCOUNTER — Ambulatory Visit: Payer: Self-pay

## 2024-01-02 ENCOUNTER — Encounter: Payer: Self-pay | Admitting: *Deleted

## 2024-01-02 DIAGNOSIS — O09899 Supervision of other high risk pregnancies, unspecified trimester: Secondary | ICD-10-CM | POA: Insufficient documentation

## 2024-01-08 ENCOUNTER — Ambulatory Visit: Payer: Self-pay | Attending: Obstetrics and Gynecology | Admitting: Obstetrics and Gynecology

## 2024-01-08 ENCOUNTER — Ambulatory Visit: Payer: Self-pay | Attending: Obstetrics

## 2024-01-08 DIAGNOSIS — Z3A25 25 weeks gestation of pregnancy: Secondary | ICD-10-CM

## 2024-01-08 DIAGNOSIS — Z362 Encounter for other antenatal screening follow-up: Secondary | ICD-10-CM | POA: Insufficient documentation

## 2024-01-08 DIAGNOSIS — Z361 Encounter for antenatal screening for raised alphafetoprotein level: Secondary | ICD-10-CM

## 2024-01-08 DIAGNOSIS — O280 Abnormal hematological finding on antenatal screening of mother: Secondary | ICD-10-CM

## 2024-01-08 NOTE — Progress Notes (Signed)
  Maternal-Fetal Medicine Consultation I had the pleasure of seeing Ms. Toni Klein today at the [facility].  I counseled the patient with the help of Spanish language interpreter present in the room.  She is G2 P1001 at 25w 2d gestation and is here for ultrasound to complete fetal anatomical survey. After her last visit with [place], on midtrimester screening, MSAFP is increased (3.8 to MoM). Patient does not give history of vaginal bleeding.  Ultrasound Fetal growth is appropriate for gestational age.  Amniotic fluid is normal and good fetal activity seen.  Fetal anatomical survey was completed and appears normal.  Intracranial structures and fetal spine appear normal.  Abdominal wall appears normal with no evidence of omphalocele or gastroschisis.  Placenta appears normal.  I reassured the patient of the findings.  Increased maternal serum alpha-fetoprotein (MSAFP) and increased risk for open spina bifida I reassured the patient of normal fetal anatomy on ultrasound including spine and intracranial structures. I informed her that ultrasound can detect up to 95 out of 100 cases of spina bifida and that amniocentesis will only marginally improve the detection rate. Increased AFP can also be associated with fetal growth restriction (placental insufficiency), preterm delivery and stillbirth (very rare). I recommended serial fetal growth assessments. It can also follow vaginal bleeding.   Recommendations -An appointment was made for her to return in 4 weeks for fetal growth assessment. -Fetal growth assessments every 4 weeks. -Weekly BPP from [redacted] weeks gestation till delivery.   Consultation including face-to-face (more than 50%) counseling 30 minutes.

## 2024-01-09 ENCOUNTER — Other Ambulatory Visit: Payer: Self-pay | Admitting: *Deleted

## 2024-01-09 DIAGNOSIS — R768 Other specified abnormal immunological findings in serum: Secondary | ICD-10-CM

## 2024-01-09 DIAGNOSIS — R772 Abnormality of alphafetoprotein: Secondary | ICD-10-CM

## 2024-01-18 ENCOUNTER — Other Ambulatory Visit: Payer: Self-pay

## 2024-01-18 ENCOUNTER — Ambulatory Visit: Payer: Self-pay | Admitting: Obstetrics and Gynecology

## 2024-01-18 VITALS — BP 112/73 | HR 93 | Wt 118.0 lb

## 2024-01-18 DIAGNOSIS — L299 Pruritus, unspecified: Secondary | ICD-10-CM

## 2024-01-18 DIAGNOSIS — Z3492 Encounter for supervision of normal pregnancy, unspecified, second trimester: Secondary | ICD-10-CM

## 2024-01-18 DIAGNOSIS — Z603 Acculturation difficulty: Secondary | ICD-10-CM

## 2024-01-18 DIAGNOSIS — Z3A26 26 weeks gestation of pregnancy: Secondary | ICD-10-CM

## 2024-01-18 DIAGNOSIS — R768 Other specified abnormal immunological findings in serum: Secondary | ICD-10-CM

## 2024-01-18 DIAGNOSIS — Z3490 Encounter for supervision of normal pregnancy, unspecified, unspecified trimester: Secondary | ICD-10-CM

## 2024-01-18 DIAGNOSIS — R772 Abnormality of alphafetoprotein: Secondary | ICD-10-CM

## 2024-01-18 DIAGNOSIS — R7689 Other specified abnormal immunological findings in serum: Secondary | ICD-10-CM

## 2024-01-18 DIAGNOSIS — Z758 Other problems related to medical facilities and other health care: Secondary | ICD-10-CM

## 2024-01-18 MED ORDER — ASPIRIN 81 MG PO TBEC: 81.0000 mg | DELAYED_RELEASE_TABLET | Freq: Every day | ORAL | 5 refills | Status: DC

## 2024-01-18 NOTE — Progress Notes (Signed)
   PRENATAL VISIT NOTE  Subjective:  Toni Klein is a 23 y.o. [phone] at [redacted]w[redacted]d being seen today for ongoing prenatal care.  She is currently monitored for the following issues for this low-risk pregnancy and has Encounter for supervision of low-risk pregnancy, antepartum; ANA positive; and Short interval between pregnancies affecting pregnancy, antepartum on their problem list.  Patient reports itching hands, and other places. No rash present no changes in lotions.detergent,soap Contractions: Not present. Vag. Bleeding: None.  Movement: Present. Denies leaking of fluid.   The following portions of the patient's history were reviewed and updated as appropriate: allergies, current medications, past family history, past medical history, past social history, past surgical history and problem list.   Objective:   Vitals:   01/18/24 0943  BP: 112/73  Pulse: 93  Weight: 118 lb (53.5 kg)    Fetal Status: Fetal Heart Rate (bpm): 140   Movement: Present     General:  Alert, oriented and cooperative. Patient is in no acute distress.  Skin: Skin is warm and dry. No rash noted.   Cardiovascular: Normal heart rate noted  Respiratory: Normal respiratory effort, no problems with respiration noted  Abdomen: Soft, gravid, appropriate for gestational age.  Pain/Pressure: Absent     Pelvic: Cervical exam deferred        Extremities: Normal range of motion.  Edema: None  Mental Status: Normal mood and affect. Normal behavior. Normal judgment and thought content.   Assessment and Plan:  Pregnancy: [id number] at [redacted]w[redacted]d 1. Encounter for supervision of low-risk pregnancy, antepartum (Primary) BP and FHR normal Doing well, feeling regular movement    2. [redacted] weeks gestation of pregnancy GTT and labs today  Discussed TDAP, declines today   3. ANA positive Continue ASA  4. Language barrier Interpreter present   5. Elevated AFP Normal fetal anatomy  Follow up growth 3/20 Antenatal  testing starting at 32 weeks   6. Pruritus Adding on labs today  - Bile acids, total - Comp Met (CMET)   Preterm labor symptoms and general obstetric precautions including but not limited to vaginal bleeding, contractions, leaking of fluid and fetal movement were reviewed in detail with the patient. Please refer to After Visit Summary for other counseling recommendations.   Return in about 2 weeks (around 02/01/2024) for OB VISIT (MD or APP).  Future Appointments  Date Time Provider Department Center  02/07/2024 11:15 AM [facility] NURSE [facility]  02/07/2024 11:30 AM [facility] US4 [facility]    Albertine Grates, FNP

## 2024-01-19 LAB — BILE ACIDS, TOTAL: Bile Acids Total: 2.1 umol/L (ref 0.0–10.0)

## 2024-01-19 LAB — COMPREHENSIVE METABOLIC PANEL
ALT: 10 IU/L (ref 0–32)
AST: 15 IU/L (ref 0–40)
Albumin: 3.5 g/dL — ABNORMAL LOW (ref 4.0–5.0)
Alkaline Phosphatase: 177 IU/L — ABNORMAL HIGH (ref 44–121)
BUN/Creatinine Ratio: 11 (ref 9–23)
BUN: 4 mg/dL — ABNORMAL LOW (ref 6–20)
Bilirubin Total: <0.2 mg/dL (ref 0.0–1.2)
CO2: 21 mmol/L (ref 20–29)
Calcium: 8.8 mg/dL (ref 8.7–10.2)
Chloride: 105 mmol/L (ref 96–106)
Creatinine, Ser: 0.36 mg/dL — ABNORMAL LOW (ref 0.57–1.00)
Globulin, Total: 2.8 g/dL (ref 1.5–4.5)
Glucose: 87 mg/dL (ref 70–99)
Potassium: 3.6 mmol/L (ref 3.5–5.2)
Sodium: 140 mmol/L (ref 134–144)
Total Protein: 6.3 g/dL (ref 6.0–8.5)
eGFR: 148 mL/min/{1.73_m2} (ref >59)

## 2024-01-19 LAB — CBC
Hematocrit: 37.4 % (ref 34.0–46.6)
Hemoglobin: 12.5 g/dL (ref 11.1–15.9)
MCH: 30.3 pg (ref 26.6–33.0)
MCHC: 33.4 g/dL (ref 31.5–35.7)
MCV: 91 fL (ref 79–97)
Platelets: 352 10*3/uL (ref 150–450)
RBC: 4.13 x10E6/uL (ref 3.77–5.28)
RDW: 13.3 % (ref 11.7–15.4)
WBC: 10.5 10*3/uL (ref 3.4–10.8)

## 2024-01-19 LAB — GLUCOSE TOLERANCE, 2 HOURS W/ 1HR
Glucose, 1 hour: 131 mg/dL (ref 70–179)
Glucose, 2 hour: 87 mg/dL (ref 70–152)
Glucose, Fasting: 68 mg/dL — ABNORMAL LOW (ref 70–91)

## 2024-01-19 LAB — RPR: RPR Ser Ql: NONREACTIVE

## 2024-01-19 LAB — HIV ANTIBODY (ROUTINE TESTING W REFLEX): HIV Screen 4th Generation wRfx: NONREACTIVE

## 2024-01-29 DIAGNOSIS — R772 Abnormality of alphafetoprotein: Secondary | ICD-10-CM | POA: Insufficient documentation

## 2024-02-01 ENCOUNTER — Ambulatory Visit: Payer: Self-pay | Admitting: Obstetrics and Gynecology

## 2024-02-01 ENCOUNTER — Other Ambulatory Visit: Payer: Self-pay

## 2024-02-01 VITALS — BP 104/69 | HR 89 | Wt 121.0 lb

## 2024-02-01 DIAGNOSIS — H66002 Acute suppurative otitis media without spontaneous rupture of ear drum, left ear: Secondary | ICD-10-CM

## 2024-02-01 DIAGNOSIS — Z758 Other problems related to medical facilities and other health care: Secondary | ICD-10-CM

## 2024-02-01 DIAGNOSIS — Z3A28 28 weeks gestation of pregnancy: Secondary | ICD-10-CM

## 2024-02-01 DIAGNOSIS — R772 Abnormality of alphafetoprotein: Secondary | ICD-10-CM

## 2024-02-01 DIAGNOSIS — Z3493 Encounter for supervision of normal pregnancy, unspecified, third trimester: Secondary | ICD-10-CM

## 2024-02-01 DIAGNOSIS — Z3490 Encounter for supervision of normal pregnancy, unspecified, unspecified trimester: Secondary | ICD-10-CM

## 2024-02-01 DIAGNOSIS — Z603 Acculturation difficulty: Secondary | ICD-10-CM

## 2024-02-01 MED ORDER — AMOXICILLIN-POT CLAVULANATE 875-125 MG PO TABS: 1.0000 | ORAL_TABLET | Freq: Two times a day (BID) | ORAL | 0 refills | Status: AC

## 2024-02-01 NOTE — Progress Notes (Signed)
   PRENATAL VISIT NOTE  Subjective:  Toni Klein is a 23 y.o. [phone] at [redacted]w[redacted]d being seen today for ongoing prenatal care.  She is currently monitored for the following issues for this low-risk pregnancy and has Encounter for supervision of low-risk pregnancy, antepartum; ANA positive; Short interval between pregnancies affecting pregnancy, antepartum; and AFP 3.82, OSBR 1:33 on their problem list.  Patient reports bilateral ears itching, and pain, left more than the other .  Contractions: Not present. Vag. Bleeding: None.  Movement: Present. Denies leaking of fluid.   The following portions of the patient's history were reviewed and updated as appropriate: allergies, current medications, past family history, past medical history, past social history, past surgical history and problem list.   Objective:   Vitals:   02/01/24 1000  BP: 104/69  Pulse: 89  Weight: 121 lb (54.9 kg)    Fetal Status: Fetal Heart Rate (bpm): 147   Movement: Present     General:  Alert, oriented and cooperative. Patient is in no acute distress.  Skin: Skin is warm and dry. No rash noted.   Cardiovascular: Normal heart rate noted  Respiratory: Normal respiratory effort, no problems with respiration noted  Abdomen: Soft, gravid, appropriate for gestational age.  Pain/Pressure: Absent     Pelvic: Cervical exam deferred        Extremities: Normal range of motion.  Edema: Trace  Mental Status: Normal mood and affect. Normal behavior. Normal judgment and thought content.   Assessment and Plan:  Pregnancy: [id number] at [redacted]w[redacted]d 1. Encounter for supervision of low-risk pregnancy, antepartum (Primary) BP and FHR normal Doing well, feeling regular movement    2. [redacted] weeks gestation of pregnancy Normal GTT last visit Interested in nexplanon   3. Non-recurrent acute suppurative otitis media of left ear without spontaneous rupture of tympanic membrane  - amoxicillin-clavulanate (AUGMENTIN) 875-125  MG tablet; Take 1 tablet by mouth 2 (two) times daily for 5 days.  Dispense: 10 tablet; Refill: 0  4. Elevated AFP Normal fetal anatomy  Follow up 3/20  5. Language barrier In person interpreter present  Preterm labor symptoms and general obstetric precautions including but not limited to vaginal bleeding, contractions, leaking of fluid and fetal movement were reviewed in detail with the patient. Please refer to After Visit Summary for other counseling recommendations.  Return in two weeks for routine prenatal   Future Appointments  Date Time Provider Department Center  02/07/2024 11:15 AM [facility] NURSE [facility]  02/07/2024 11:30 AM [facility] US4 [facility]  02/15/2024  9:15 AM Sue Lush, FNP [facility]  02/28/2024 11:15 AM Sue Lush, FNP [facility]    Albertine Grates, FNP

## 2024-02-07 ENCOUNTER — Other Ambulatory Visit: Payer: Self-pay | Admitting: *Deleted

## 2024-02-07 ENCOUNTER — Ambulatory Visit: Payer: Self-pay

## 2024-02-07 ENCOUNTER — Ambulatory Visit: Payer: Self-pay | Attending: Obstetrics and Gynecology | Admitting: *Deleted

## 2024-02-07 VITALS — BP 103/61 | HR 100

## 2024-02-07 DIAGNOSIS — Z3A29 29 weeks gestation of pregnancy: Secondary | ICD-10-CM | POA: Insufficient documentation

## 2024-02-07 DIAGNOSIS — R772 Abnormality of alphafetoprotein: Secondary | ICD-10-CM

## 2024-02-07 DIAGNOSIS — R768 Other specified abnormal immunological findings in serum: Secondary | ICD-10-CM

## 2024-02-07 DIAGNOSIS — R760 Raised antibody titer: Secondary | ICD-10-CM | POA: Insufficient documentation

## 2024-02-07 DIAGNOSIS — O09893 Supervision of other high risk pregnancies, third trimester: Secondary | ICD-10-CM | POA: Insufficient documentation

## 2024-02-07 DIAGNOSIS — Z362 Encounter for other antenatal screening follow-up: Secondary | ICD-10-CM | POA: Insufficient documentation

## 2024-02-07 DIAGNOSIS — O26893 Other specified pregnancy related conditions, third trimester: Secondary | ICD-10-CM

## 2024-02-15 ENCOUNTER — Encounter: Payer: Self-pay | Admitting: Obstetrics and Gynecology

## 2024-02-20 ENCOUNTER — Encounter: Payer: Self-pay | Admitting: Obstetrics and Gynecology

## 2024-02-28 ENCOUNTER — Ambulatory Visit: Payer: Self-pay | Admitting: Obstetrics and Gynecology

## 2024-02-28 ENCOUNTER — Other Ambulatory Visit: Payer: Self-pay

## 2024-02-28 VITALS — BP 110/73 | HR 96 | Wt 126.0 lb

## 2024-02-28 DIAGNOSIS — Z3493 Encounter for supervision of normal pregnancy, unspecified, third trimester: Secondary | ICD-10-CM

## 2024-02-28 DIAGNOSIS — Z758 Other problems related to medical facilities and other health care: Secondary | ICD-10-CM

## 2024-02-28 DIAGNOSIS — Z3490 Encounter for supervision of normal pregnancy, unspecified, unspecified trimester: Secondary | ICD-10-CM

## 2024-02-28 DIAGNOSIS — Z3A32 32 weeks gestation of pregnancy: Secondary | ICD-10-CM

## 2024-02-28 DIAGNOSIS — H66006 Acute suppurative otitis media without spontaneous rupture of ear drum, recurrent, bilateral: Secondary | ICD-10-CM

## 2024-02-28 DIAGNOSIS — Z603 Acculturation difficulty: Secondary | ICD-10-CM

## 2024-02-28 DIAGNOSIS — R772 Abnormality of alphafetoprotein: Secondary | ICD-10-CM

## 2024-02-28 MED ORDER — CEFDINIR 300 MG PO CAPS: 300.0000 mg | ORAL_CAPSULE | Freq: Two times a day (BID) | ORAL | 0 refills | Status: DC

## 2024-02-28 NOTE — Progress Notes (Signed)
   PRENATAL VISIT NOTE  Subjective:  Toni Klein is a 23 y.o. [phone] at [redacted]w[redacted]d being seen today for ongoing prenatal care.  She is currently monitored for the following issues for this low-risk pregnancy and has Encounter for supervision of low-risk pregnancy, antepartum; ANA positive; Short interval between pregnancies affecting pregnancy, antepartum; and AFP 3.82, OSBR 1:33 on their problem list.  Patient reports  pain in ear, fullness/pressure .  Contractions: Not present. Vag. Bleeding: None.  Movement: Present. Denies leaking of fluid.   The following portions of the patient's history were reviewed and updated as appropriate: allergies, current medications, past family history, past medical history, past social history, past surgical history and problem list.   Objective:   Vitals:   02/28/24 1127  BP: 110/73  Pulse: 96  Weight: 126 lb (57.2 kg)    Fetal Status: Fetal Heart Rate (bpm): 151   Movement: Present     General:  Alert, oriented and cooperative. Patient is in no acute distress.  Skin: Skin is warm and dry. No rash noted.   Cardiovascular: Normal heart rate noted  Respiratory: Normal respiratory effort, no problems with respiration noted  Abdomen: Soft, gravid, appropriate for gestational age.  Pain/Pressure: Absent     Pelvic: Cervical exam deferred        Extremities: Normal range of motion.  Edema: None  Mental Status: Normal mood and affect. Normal behavior. Normal judgment and thought content.   Assessment and Plan:  Pregnancy: [id number] at [redacted]w[redacted]d 1. Encounter for supervision of low-risk pregnancy, antepartum (Primary) BP and FHR normal Doing well, feeling regular movement    2. [redacted] weeks gestation of pregnancy   3. Elevated AFP Normal fetal anatomy Weekly antenatal testing starting 4/17  4. Language barrier In person inteproreter presetn   5. Recurrent acute suppurative otitis media without spontaneous rupture of tympanic membrane of  both sides  - cefdinir (OMNICEF) 300 MG capsule; Take 1 capsule (300 mg total) by mouth 2 (two) times daily for 7 days.  Dispense: 14 capsule; Refill: 0    Preterm labor symptoms and general obstetric precautions including but not limited to vaginal bleeding, contractions, leaking of fluid and fetal movement were reviewed in detail with the patient. Please refer to After Visit Summary for other counseling recommendations.   Return in about 2 weeks (around 03/13/2024) for OB VISIT (MD or APP).  Future Appointments  Date Time Provider Department Center  03/06/2024  8:00 AM [facility] PROVIDER 1 [facility]  03/06/2024  8:30 AM [facility] US6 [facility]  03/13/2024  2:55 PM Adam Phenix, MD [facility]    Albertine Grates, FNP

## 2024-03-06 ENCOUNTER — Ambulatory Visit: Payer: Self-pay | Attending: Obstetrics and Gynecology

## 2024-03-06 ENCOUNTER — Ambulatory Visit: Payer: Self-pay | Admitting: Obstetrics and Gynecology

## 2024-03-06 ENCOUNTER — Other Ambulatory Visit: Payer: Self-pay | Admitting: *Deleted

## 2024-03-06 VITALS — BP 120/66 | HR 82

## 2024-03-06 DIAGNOSIS — Z3A33 33 weeks gestation of pregnancy: Secondary | ICD-10-CM

## 2024-03-06 DIAGNOSIS — R772 Abnormality of alphafetoprotein: Secondary | ICD-10-CM

## 2024-03-06 DIAGNOSIS — O288 Other abnormal findings on antenatal screening of mother: Secondary | ICD-10-CM

## 2024-03-06 DIAGNOSIS — O26893 Other specified pregnancy related conditions, third trimester: Secondary | ICD-10-CM

## 2024-03-06 NOTE — Progress Notes (Signed)
 After review, MFM consult with provider is not indicated for today  Cassandria Clever, MD 03/06/2024 5:39 PM  [facility]

## 2024-03-13 ENCOUNTER — Ambulatory Visit: Payer: Self-pay

## 2024-03-13 ENCOUNTER — Other Ambulatory Visit: Payer: Self-pay

## 2024-03-13 ENCOUNTER — Ambulatory Visit (INDEPENDENT_AMBULATORY_CARE_PROVIDER_SITE_OTHER): Payer: Self-pay | Admitting: Advanced Practice Midwife

## 2024-03-13 VITALS — BP 111/76 | HR 102 | Wt 128.0 lb

## 2024-03-13 DIAGNOSIS — Z3490 Encounter for supervision of normal pregnancy, unspecified, unspecified trimester: Secondary | ICD-10-CM

## 2024-03-13 DIAGNOSIS — Z758 Other problems related to medical facilities and other health care: Secondary | ICD-10-CM

## 2024-03-13 DIAGNOSIS — O09893 Supervision of other high risk pregnancies, third trimester: Secondary | ICD-10-CM

## 2024-03-13 DIAGNOSIS — R768 Other specified abnormal immunological findings in serum: Secondary | ICD-10-CM

## 2024-03-13 DIAGNOSIS — R772 Abnormality of alphafetoprotein: Secondary | ICD-10-CM

## 2024-03-13 DIAGNOSIS — O09899 Supervision of other high risk pregnancies, unspecified trimester: Secondary | ICD-10-CM

## 2024-03-13 DIAGNOSIS — Z3A34 34 weeks gestation of pregnancy: Secondary | ICD-10-CM

## 2024-03-13 DIAGNOSIS — Z603 Acculturation difficulty: Secondary | ICD-10-CM

## 2024-03-13 DIAGNOSIS — Z3493 Encounter for supervision of normal pregnancy, unspecified, third trimester: Secondary | ICD-10-CM

## 2024-03-13 NOTE — Progress Notes (Signed)
   PRENATAL VISIT NOTE  Subjective:  Toni Klein is a 23 y.o. [phone] at [redacted]w[redacted]d being seen today for ongoing prenatal care.  She is currently monitored for the following issues for this high-risk pregnancy and has Encounter for supervision of low-risk pregnancy, antepartum; ANA positive; Short interval between pregnancies affecting pregnancy, antepartum; AFP 3.82, OSBR 1:33; and Language barrier affecting health care on their problem list.   Patient reports occasional contractions.  Contractions: Not present. Vag. Bleeding: None.  Movement: Present. Denies leaking of fluid.   The following portions of the patient's history were reviewed and updated as appropriate: allergies, current medications, past family history, past medical history, past social history, past surgical history and problem list.   Objective:   Vitals:   03/13/24 1531  BP: 111/76  Pulse: (!) 102  Weight: 128 lb (58.1 kg)    Fetal Status: Fetal Heart Rate (bpm): 156   Movement: Present     General:  Alert, oriented and cooperative. Patient is in no acute distress.  Skin: Skin is warm and dry. No rash noted.   Cardiovascular: Normal heart rate noted  Respiratory: Normal respiratory effort, no problems with respiration noted  Abdomen: Soft, gravid, appropriate for gestational age.  Pain/Pressure: Absent     Pelvic: Cervical exam deferred        Extremities: Normal range of motion.  Edema: None  Mental Status: Normal mood and affect. Normal behavior. Normal judgment and thought content.   Est. FW: 1944 gm 4 lb 5 oz 12 %   Assessment and Plan:  Pregnancy: [id number] at [redacted]w[redacted]d 1. [redacted] weeks gestation of pregnancy (Primary)  2. Short interval between pregnancies affecting pregnancy, antepartum  3. Encounter for supervision of low-risk pregnancy, antepartum  4. ANA positive - growth US 's and BPPs per MFM 5. Language barrier affecting health care - Spanish Interpreter used  6. AFP 3.82, OSBR 1:33 -  Antenatal testing per MFM   Preterm labor symptoms and general obstetric precautions including but not limited to vaginal bleeding, contractions, leaking of fluid and fetal movement were reviewed in detail with the patient. Please refer to After Visit Summary for other counseling recommendations.   No follow-ups on file.  Future Appointments  Date Time Provider Department Center  03/28/2024  3:00 PM [facility] PROVIDER 1 [facility]  03/28/2024  3:30 PM [facility] US1 [facility]  04/04/2024  3:00 PM [facility] PROVIDER 1 [facility]  04/04/2024  3:30 PM [facility] US3 [facility]    Kekai Geter  Felipe Horton, CNM [facility] for [employer]

## 2024-03-27 ENCOUNTER — Inpatient Hospital Stay (HOSPITAL_COMMUNITY): Payer: MEDICAID | Admitting: Anesthesiology

## 2024-03-27 ENCOUNTER — Encounter (HOSPITAL_COMMUNITY): Payer: Self-pay | Admitting: Obstetrics and Gynecology

## 2024-03-27 ENCOUNTER — Inpatient Hospital Stay (HOSPITAL_COMMUNITY): Payer: MEDICAID

## 2024-03-27 ENCOUNTER — Inpatient Hospital Stay (HOSPITAL_BASED_OUTPATIENT_CLINIC_OR_DEPARTMENT_OTHER): Payer: MEDICAID

## 2024-03-27 ENCOUNTER — Other Ambulatory Visit: Payer: Self-pay

## 2024-03-27 ENCOUNTER — Encounter (HOSPITAL_COMMUNITY)
Admission: AD | Disposition: A | Discharge status: Home / Self Care | Payer: Self-pay | Source: Home / Self Care | Attending: Obstetrics & Gynecology

## 2024-03-27 ENCOUNTER — Inpatient Hospital Stay (HOSPITAL_COMMUNITY)
Admission: AD | Admit: 2024-03-27 | Discharge: 2024-04-01 | DRG: 786 | Disposition: A | Discharge status: Home / Self Care | Payer: MEDICAID | Attending: Family Medicine | Admitting: Family Medicine

## 2024-03-27 DIAGNOSIS — R1010 Upper abdominal pain, unspecified: Secondary | ICD-10-CM

## 2024-03-27 DIAGNOSIS — O1420 HELLP syndrome (HELLP), unspecified trimester: Secondary | ICD-10-CM | POA: Diagnosis present

## 2024-03-27 DIAGNOSIS — I10 Essential (primary) hypertension: Secondary | ICD-10-CM

## 2024-03-27 DIAGNOSIS — Z3A36 36 weeks gestation of pregnancy: Secondary | ICD-10-CM

## 2024-03-27 DIAGNOSIS — O9962 Diseases of the digestive system complicating childbirth: Secondary | ICD-10-CM | POA: Diagnosis present

## 2024-03-27 DIAGNOSIS — Z30017 Encounter for initial prescription of implantable subdermal contraceptive: Secondary | ICD-10-CM | POA: Diagnosis not present

## 2024-03-27 DIAGNOSIS — D65 Disseminated intravascular coagulation [defibrination syndrome]: Secondary | ICD-10-CM | POA: Diagnosis present

## 2024-03-27 DIAGNOSIS — O9081 Anemia of the puerperium: Secondary | ICD-10-CM | POA: Diagnosis not present

## 2024-03-27 DIAGNOSIS — Z758 Other problems related to medical facilities and other health care: Secondary | ICD-10-CM | POA: Diagnosis present

## 2024-03-27 DIAGNOSIS — O902 Hematoma of obstetric wound: Secondary | ICD-10-CM | POA: Diagnosis not present

## 2024-03-27 DIAGNOSIS — K819 Cholecystitis, unspecified: Secondary | ICD-10-CM

## 2024-03-27 DIAGNOSIS — O1424 HELLP syndrome, complicating childbirth: Principal | ICD-10-CM | POA: Diagnosis present

## 2024-03-27 DIAGNOSIS — O1414 Severe pre-eclampsia complicating childbirth: Secondary | ICD-10-CM

## 2024-03-27 DIAGNOSIS — D62 Acute posthemorrhagic anemia: Secondary | ICD-10-CM | POA: Diagnosis not present

## 2024-03-27 DIAGNOSIS — K810 Acute cholecystitis: Secondary | ICD-10-CM | POA: Diagnosis present

## 2024-03-27 DIAGNOSIS — R1013 Epigastric pain: Secondary | ICD-10-CM | POA: Diagnosis present

## 2024-03-27 DIAGNOSIS — R7401 Elevation of levels of liver transaminase levels: Secondary | ICD-10-CM

## 2024-03-27 DIAGNOSIS — R1084 Generalized abdominal pain: Secondary | ICD-10-CM

## 2024-03-27 DIAGNOSIS — Z8759 Personal history of other complications of pregnancy, childbirth and the puerperium: Secondary | ICD-10-CM

## 2024-03-27 DIAGNOSIS — Z862 Personal history of diseases of the blood and blood-forming organs and certain disorders involving the immune mechanism: Secondary | ICD-10-CM

## 2024-03-27 DIAGNOSIS — O9912 Other diseases of the blood and blood-forming organs and certain disorders involving the immune mechanism complicating childbirth: Secondary | ICD-10-CM | POA: Diagnosis present

## 2024-03-27 DIAGNOSIS — O26893 Other specified pregnancy related conditions, third trimester: Secondary | ICD-10-CM

## 2024-03-27 DIAGNOSIS — Z98891 History of uterine scar from previous surgery: Secondary | ICD-10-CM

## 2024-03-27 DIAGNOSIS — Z603 Acculturation difficulty: Secondary | ICD-10-CM | POA: Diagnosis present

## 2024-03-27 DIAGNOSIS — E872 Acidosis, unspecified: Secondary | ICD-10-CM | POA: Diagnosis present

## 2024-03-27 DIAGNOSIS — R748 Abnormal levels of other serum enzymes: Secondary | ICD-10-CM | POA: Diagnosis present

## 2024-03-27 DIAGNOSIS — Z3490 Encounter for supervision of normal pregnancy, unspecified, unspecified trimester: Secondary | ICD-10-CM

## 2024-03-27 DIAGNOSIS — R109 Unspecified abdominal pain: Secondary | ICD-10-CM | POA: Diagnosis present

## 2024-03-27 DIAGNOSIS — K82A1 Gangrene of gallbladder in cholecystitis: Secondary | ICD-10-CM | POA: Diagnosis present

## 2024-03-27 DIAGNOSIS — O26613 Liver and biliary tract disorders in pregnancy, third trimester: Secondary | ICD-10-CM

## 2024-03-27 DIAGNOSIS — Z7982 Long term (current) use of aspirin: Secondary | ICD-10-CM

## 2024-03-27 DIAGNOSIS — O09899 Supervision of other high risk pregnancies, unspecified trimester: Secondary | ICD-10-CM

## 2024-03-27 DIAGNOSIS — O26899 Other specified pregnancy related conditions, unspecified trimester: Secondary | ICD-10-CM

## 2024-03-27 HISTORY — DX: Unspecified ectopic pregnancy without intrauterine pregnancy: O00.90

## 2024-03-27 HISTORY — DX: HELLP syndrome (HELLP), unspecified trimester: O14.20

## 2024-03-27 HISTORY — PX: EXAM UNDER ANESTHESIA, PELVIC: SHX7461

## 2024-03-27 LAB — DIC (DISSEMINATED INTRAVASCULAR COAGULATION)PANEL
D-Dimer, Quant: >20 ug{FEU}/mL — ABNORMAL HIGH (ref 0.00–0.50)
D-Dimer, Quant: 18.69 ug{FEU}/mL — ABNORMAL HIGH (ref 0.00–0.50)
Fibrinogen: 114 mg/dL — ABNORMAL LOW (ref 210–475)
Fibrinogen: 167 mg/dL — ABNORMAL LOW (ref 210–475)
INR: 1.8 — ABNORMAL HIGH (ref 0.8–1.2)
INR: 1.5 — ABNORMAL HIGH (ref 0.8–1.2)
Platelets: 61 10*3/uL — ABNORMAL LOW (ref 150–400)
Platelets: 80 10*3/uL — ABNORMAL LOW (ref 150–400)
Prothrombin Time: 21.2 s — ABNORMAL HIGH (ref 11.4–15.2)
Prothrombin Time: 18.1 s — ABNORMAL HIGH (ref 11.4–15.2)
Smear Review: NONE SEEN
Smear Review: NONE SEEN
aPTT: 36 s (ref 24–36)
aPTT: 30 s (ref 24–36)

## 2024-03-27 LAB — COMPREHENSIVE METABOLIC PANEL WITH GFR
ALT: 1214 U/L — ABNORMAL HIGH (ref 0–44)
ALT: 995 U/L — ABNORMAL HIGH (ref 0–44)
ALT: 877 U/L — ABNORMAL HIGH (ref 0–44)
AST: 2157 U/L — ABNORMAL HIGH (ref 15–41)
AST: 3005 U/L — ABNORMAL HIGH (ref 15–41)
AST: 2247 U/L — ABNORMAL HIGH (ref 15–41)
Albumin: 3 g/dL — ABNORMAL LOW (ref 3.5–5.0)
Albumin: 2.3 g/dL — ABNORMAL LOW (ref 3.5–5.0)
Albumin: 2.2 g/dL — ABNORMAL LOW (ref 3.5–5.0)
Alkaline Phosphatase: 230 U/L — ABNORMAL HIGH (ref 38–126)
Alkaline Phosphatase: 134 U/L — ABNORMAL HIGH (ref 38–126)
Alkaline Phosphatase: 132 U/L — ABNORMAL HIGH (ref 38–126)
Anion gap: 11 (ref 5–15)
Anion gap: 9 (ref 5–15)
Anion gap: 10 (ref 5–15)
BUN: 10 mg/dL (ref 6–20)
BUN: 9 mg/dL (ref 6–20)
BUN: 9 mg/dL (ref 6–20)
CO2: 17 mmol/L — ABNORMAL LOW (ref 22–32)
CO2: 17 mmol/L — ABNORMAL LOW (ref 22–32)
CO2: 20 mmol/L — ABNORMAL LOW (ref 22–32)
Calcium: 8.4 mg/dL — ABNORMAL LOW (ref 8.9–10.3)
Calcium: 7.1 mg/dL — ABNORMAL LOW (ref 8.9–10.3)
Calcium: 6.8 mg/dL — ABNORMAL LOW (ref 8.9–10.3)
Chloride: 107 mmol/L (ref 98–111)
Chloride: 109 mmol/L (ref 98–111)
Chloride: 106 mmol/L (ref 98–111)
Creatinine, Ser: 0.6 mg/dL (ref 0.44–1.00)
Creatinine, Ser: 0.67 mg/dL (ref 0.44–1.00)
Creatinine, Ser: 0.67 mg/dL (ref 0.44–1.00)
GFR, Estimated: >60 mL/min (ref >60)
GFR, Estimated: >60 mL/min (ref >60)
GFR, Estimated: >60 mL/min (ref >60)
Glucose, Bld: 120 mg/dL — ABNORMAL HIGH (ref 70–99)
Glucose, Bld: 158 mg/dL — ABNORMAL HIGH (ref 70–99)
Glucose, Bld: 144 mg/dL — ABNORMAL HIGH (ref 70–99)
Potassium: 3.8 mmol/L (ref 3.5–5.1)
Potassium: 4.5 mmol/L (ref 3.5–5.1)
Potassium: 4.6 mmol/L (ref 3.5–5.1)
Sodium: 135 mmol/L (ref 135–145)
Sodium: 135 mmol/L (ref 135–145)
Sodium: 136 mmol/L (ref 135–145)
Total Bilirubin: 1 mg/dL (ref 0.0–1.2)
Total Bilirubin: 6.3 mg/dL — ABNORMAL HIGH (ref 0.0–1.2)
Total Bilirubin: 8.8 mg/dL — ABNORMAL HIGH (ref 0.0–1.2)
Total Protein: 6.5 g/dL (ref 6.5–8.1)
Total Protein: 4.4 g/dL — ABNORMAL LOW (ref 6.5–8.1)
Total Protein: 4.5 g/dL — ABNORMAL LOW (ref 6.5–8.1)

## 2024-03-27 LAB — CBC
HCT: 40.9 % (ref 36.0–46.0)
HCT: 26.8 % — ABNORMAL LOW (ref 36.0–46.0)
HCT: 20.9 % — ABNORMAL LOW (ref 36.0–46.0)
Hemoglobin: 14.2 g/dL (ref 12.0–15.0)
Hemoglobin: 9.2 g/dL — ABNORMAL LOW (ref 12.0–15.0)
Hemoglobin: 7.2 g/dL — ABNORMAL LOW (ref 12.0–15.0)
MCH: 31 pg (ref 26.0–34.0)
MCH: 31.5 pg (ref 26.0–34.0)
MCH: 30.9 pg (ref 26.0–34.0)
MCHC: 34.7 g/dL (ref 30.0–36.0)
MCHC: 34.3 g/dL (ref 30.0–36.0)
MCHC: 34.4 g/dL (ref 30.0–36.0)
MCV: 89.3 fL (ref 80.0–100.0)
MCV: 91.8 fL (ref 80.0–100.0)
MCV: 89.7 fL (ref 80.0–100.0)
Platelets: 163 10*3/uL (ref 150–400)
Platelets: 59 10*3/uL — ABNORMAL LOW (ref 150–400)
Platelets: 80 10*3/uL — ABNORMAL LOW (ref 150–400)
RBC: 4.58 MIL/uL (ref 3.87–5.11)
RBC: 2.92 MIL/uL — ABNORMAL LOW (ref 3.87–5.11)
RBC: 2.33 MIL/uL — ABNORMAL LOW (ref 3.87–5.11)
RDW: 13.5 % (ref 11.5–15.5)
RDW: 14.4 % (ref 11.5–15.5)
RDW: 14.6 % (ref 11.5–15.5)
WBC: 14 10*3/uL — ABNORMAL HIGH (ref 4.0–10.5)
WBC: 12.6 10*3/uL — ABNORMAL HIGH (ref 4.0–10.5)
WBC: 15 10*3/uL — ABNORMAL HIGH (ref 4.0–10.5)
nRBC: 0 % (ref 0.0–0.2)
nRBC: 0 % (ref 0.0–0.2)
nRBC: 0 % (ref 0.0–0.2)

## 2024-03-27 LAB — HEPATITIS PANEL, ACUTE
HCV Ab: NONREACTIVE
Hep A IgM: NONREACTIVE
Hep B C IgM: NONREACTIVE
Hepatitis B Surface Ag: NONREACTIVE

## 2024-03-27 LAB — POCT I-STAT 7, (LYTES, BLD GAS, ICA,H+H)
Acid-base deficit: 11 mmol/L — ABNORMAL HIGH (ref 0.0–2.0)
Acid-base deficit: 10 mmol/L — ABNORMAL HIGH (ref 0.0–2.0)
Bicarbonate: 15.8 mmol/L — ABNORMAL LOW (ref 20.0–28.0)
Bicarbonate: 16.2 mmol/L — ABNORMAL LOW (ref 20.0–28.0)
Calcium, Ion: 1.12 mmol/L — ABNORMAL LOW (ref 1.15–1.40)
Calcium, Ion: 1.07 mmol/L — ABNORMAL LOW (ref 1.15–1.40)
HCT: 25 % — ABNORMAL LOW (ref 36.0–46.0)
HCT: 22 % — ABNORMAL LOW (ref 36.0–46.0)
Hemoglobin: 8.5 g/dL — ABNORMAL LOW (ref 12.0–15.0)
Hemoglobin: 7.5 g/dL — ABNORMAL LOW (ref 12.0–15.0)
O2 Saturation: 99 %
O2 Saturation: 99 %
Patient temperature: 36.1
Patient temperature: 98.2
Potassium: 4.2 mmol/L (ref 3.5–5.1)
Potassium: 4.5 mmol/L (ref 3.5–5.1)
Sodium: 134 mmol/L — ABNORMAL LOW (ref 135–145)
Sodium: 134 mmol/L — ABNORMAL LOW (ref 135–145)
TCO2: 17 mmol/L — ABNORMAL LOW (ref 22–32)
TCO2: 17 mmol/L — ABNORMAL LOW (ref 22–32)
pCO2 arterial: 37.2 mmHg (ref 32–48)
pCO2 arterial: 34.1 mmHg (ref 32–48)
pH, Arterial: 7.231 — ABNORMAL LOW (ref 7.35–7.45)
pH, Arterial: 7.282 — ABNORMAL LOW (ref 7.35–7.45)
pO2, Arterial: 132 mmHg — ABNORMAL HIGH (ref 83–108)
pO2, Arterial: 138 mmHg — ABNORMAL HIGH (ref 83–108)

## 2024-03-27 LAB — APTT: aPTT: 31 s (ref 24–36)

## 2024-03-27 LAB — MAGNESIUM
Magnesium: 5.9 mg/dL — ABNORMAL HIGH (ref 1.7–2.4)
Magnesium: 5.9 mg/dL — ABNORMAL HIGH (ref 1.7–2.4)
Magnesium: 6.3 mg/dL (ref 1.7–2.4)

## 2024-03-27 LAB — PROTIME-INR
INR: 1.2 (ref 0.8–1.2)
Prothrombin Time: 15.8 s — ABNORMAL HIGH (ref 11.4–15.2)

## 2024-03-27 LAB — GLUCOSE, CAPILLARY: Glucose-Capillary: 163 mg/dL — ABNORMAL HIGH (ref 70–99)

## 2024-03-27 LAB — SALICYLATE LEVEL: Salicylate Lvl: <7 mg/dL — ABNORMAL LOW (ref 7.0–30.0)

## 2024-03-27 LAB — PROTEIN / CREATININE RATIO, URINE
Creatinine, Urine: 79 mg/dL
Protein Creatinine Ratio: 6.75 mg/mg{creat} — ABNORMAL HIGH (ref 0.00–0.15)
Total Protein, Urine: 533 mg/dL

## 2024-03-27 LAB — LACTIC ACID, PLASMA
Lactic Acid, Venous: 2.9 mmol/L (ref 0.5–1.9)
Lactic Acid, Venous: 2.7 mmol/L (ref 0.5–1.9)

## 2024-03-27 LAB — BILIRUBIN, DIRECT: Bilirubin, Direct: 4 mg/dL — ABNORMAL HIGH (ref 0.0–0.2)

## 2024-03-27 LAB — AMMONIA: Ammonia: 43 umol/L — ABNORMAL HIGH (ref 9–35)

## 2024-03-27 LAB — PHOSPHORUS: Phosphorus: 3.7 mg/dL (ref 2.5–4.6)

## 2024-03-27 LAB — LACTATE DEHYDROGENASE: LDH: 1894 U/L — ABNORMAL HIGH (ref 98–192)

## 2024-03-27 LAB — LIPASE, BLOOD: Lipase: 25 U/L (ref 11–51)

## 2024-03-27 LAB — AMYLASE: Amylase: 88 U/L (ref 28–100)

## 2024-03-27 LAB — ACETAMINOPHEN LEVEL: Acetaminophen (Tylenol), Serum: <10 ug/mL — ABNORMAL LOW (ref 10–30)

## 2024-03-27 LAB — FIBRINOGEN: Fibrinogen: 242 mg/dL (ref 210–475)

## 2024-03-27 SURGERY — Surgical Case
Anesthesia: Regional

## 2024-03-27 SURGERY — Surgical Case
Anesthesia: General | Site: Abdomen

## 2024-03-27 MED ORDER — SIMETHICONE 80 MG PO CHEW: 80.0000 mg | CHEWABLE_TABLET | ORAL | Status: DC | PRN

## 2024-03-27 MED ORDER — PIPERACILLIN-TAZOBACTAM 3.375 G IVPB
3.3750 g | Freq: Three times a day (TID) | INTRAVENOUS | Status: DC
  Administered 2024-03-27 – 2024-03-31 (×12): 3.375 g via INTRAVENOUS
  Filled 2024-03-27 (×12): qty 50

## 2024-03-27 MED ORDER — SUGAMMADEX SODIUM 200 MG/2ML IV SOLN
INTRAVENOUS | Status: DC | PRN
  Administered 2024-03-27: 200 mg via INTRAVENOUS

## 2024-03-27 MED ORDER — HYDRALAZINE HCL 20 MG/ML IJ SOLN: 10.0000 mg | INTRAMUSCULAR | Status: DC | PRN

## 2024-03-27 MED ORDER — OXYTOCIN-SODIUM CHLORIDE 30-0.9 UT/500ML-% IV SOLN
2.5000 [IU]/h | INTRAVENOUS | Status: AC
  Filled 2024-03-27: qty 500

## 2024-03-27 MED ORDER — OXYTOCIN-SODIUM CHLORIDE 30-0.9 UT/500ML-% IV SOLN
INTRAVENOUS | Status: DC | PRN
  Administered 2024-03-27: 300 mL via INTRAVENOUS

## 2024-03-27 MED ORDER — SODIUM BICARBONATE 8.4 % IV SOLN
100.0000 meq | Freq: Once | INTRAVENOUS | Status: AC
  Administered 2024-03-27: 100 meq via INTRAVENOUS
  Filled 2024-03-27: qty 50

## 2024-03-27 MED ORDER — MIDAZOLAM HCL 2 MG/2ML IJ SOLN
INTRAMUSCULAR | Status: AC
  Filled 2024-03-27: qty 2

## 2024-03-27 MED ORDER — HYDROMORPHONE HCL 1 MG/ML IJ SOLN
0.5000 mg | INTRAMUSCULAR | Status: DC | PRN
  Administered 2024-03-27 – 2024-03-28 (×5): 1 mg via INTRAVENOUS
  Filled 2024-03-27 (×4): qty 1

## 2024-03-27 MED ORDER — SODIUM BICARBONATE 8.4 % IV SOLN
INTRAVENOUS | Status: DC
  Filled 2024-03-27 (×2): qty 1000

## 2024-03-27 MED ORDER — MAGNESIUM SULFATE BOLUS VIA INFUSION
6.0000 g | Freq: Once | INTRAVENOUS | Status: DC
  Filled 2024-03-27: qty 1000

## 2024-03-27 MED ORDER — SODIUM CHLORIDE 0.9% IV SOLUTION: Freq: Once | INTRAVENOUS | Status: DC

## 2024-03-27 MED ORDER — ORAL CARE MOUTH RINSE: 15.0000 mL | OROMUCOSAL | Status: DC | PRN

## 2024-03-27 MED ORDER — CEFAZOLIN SODIUM-DEXTROSE 2-3 GM-%(50ML) IV SOLR
INTRAVENOUS | Status: DC | PRN
Start: 2024-03-27 — End: 2024-03-27
  Administered 2024-03-27: 2 g via INTRAVENOUS

## 2024-03-27 MED ORDER — LABETALOL HCL 5 MG/ML IV SOLN
20.0000 mg | INTRAVENOUS | Status: DC | PRN
  Administered 2024-03-28: 20 mg via INTRAVENOUS
  Filled 2024-03-27 (×2): qty 4

## 2024-03-27 MED ORDER — HYDROCODONE-ACETAMINOPHEN 5-325 MG PO TABS
1.0000 | ORAL_TABLET | ORAL | Status: DC | PRN
  Filled 2024-03-27: qty 2

## 2024-03-27 MED ORDER — LORAZEPAM 2 MG/ML IJ SOLN
2.0000 mg | INTRAMUSCULAR | Status: DC | PRN
Start: 2024-03-27 — End: 2024-04-01

## 2024-03-27 MED ORDER — OXYCODONE HCL 5 MG PO TABS: 5.0000 mg | ORAL_TABLET | Freq: Once | ORAL | Status: DC | PRN

## 2024-03-27 MED ORDER — MAGNESIUM SULFATE 40 GM/1000ML IV SOLN
2.0000 g/h | INTRAVENOUS | Status: DC
  Administered 2024-03-28: 2 g/h via INTRAVENOUS
  Filled 2024-03-27 (×2): qty 1000

## 2024-03-27 MED ORDER — FENTANYL CITRATE (PF) 100 MCG/2ML IJ SOLN
100.0000 ug | INTRAMUSCULAR | Status: DC | PRN
  Administered 2024-03-27: 100 ug via INTRAVENOUS
  Filled 2024-03-27: qty 2

## 2024-03-27 MED ORDER — PROPOFOL 10 MG/ML IV BOLUS
INTRAVENOUS | Status: DC | PRN
  Administered 2024-03-27: 120 mg via INTRAVENOUS

## 2024-03-27 MED ORDER — ZOLPIDEM TARTRATE 5 MG PO TABS: 5.0000 mg | ORAL_TABLET | Freq: Every evening | ORAL | Status: DC | PRN

## 2024-03-27 MED ORDER — ROCURONIUM BROMIDE 10 MG/ML (PF) SYRINGE
PREFILLED_SYRINGE | INTRAVENOUS | Status: DC | PRN
  Administered 2024-03-27: 10 mg via INTRAVENOUS

## 2024-03-27 MED ORDER — CALCIUM GLUCONATE-NACL 2-0.675 GM/100ML-% IV SOLN
2.0000 g | Freq: Once | INTRAVENOUS | Status: AC
  Administered 2024-03-27: 2000 mg via INTRAVENOUS
  Filled 2024-03-27: qty 100

## 2024-03-27 MED ORDER — HYDROMORPHONE HCL 1 MG/ML IJ SOLN
0.2500 mg | INTRAMUSCULAR | Status: DC | PRN
  Administered 2024-03-27 (×4): 0.5 mg via INTRAVENOUS

## 2024-03-27 MED ORDER — DEXTROSE 5 % IV SOLN
6.2500 mg/kg/h | INTRAVENOUS | Status: DC
  Administered 2024-03-27: 6.25 mg/kg/h via INTRAVENOUS
  Filled 2024-03-27: qty 90

## 2024-03-27 MED ORDER — OXYCODONE HCL 5 MG/5ML PO SOLN: 5.0000 mg | Freq: Once | ORAL | Status: DC | PRN

## 2024-03-27 MED ORDER — STERILE WATER FOR IRRIGATION IR SOLN
Status: DC | PRN
  Administered 2024-03-27: 1000 mL

## 2024-03-27 MED ORDER — LABETALOL HCL 5 MG/ML IV SOLN: 80.0000 mg | INTRAVENOUS | Status: DC | PRN

## 2024-03-27 MED ORDER — ALBUMIN HUMAN 25 % IV SOLN
25.0000 g | Freq: Four times a day (QID) | INTRAVENOUS | Status: AC
  Administered 2024-03-27 – 2024-03-28 (×4): 25 g via INTRAVENOUS
  Filled 2024-03-27 (×4): qty 100

## 2024-03-27 MED ORDER — DEXAMETHASONE SODIUM PHOSPHATE 10 MG/ML IJ SOLN
INTRAMUSCULAR | Status: DC | PRN
Start: 2024-03-27 — End: 2024-03-27
  Administered 2024-03-27: 10 mg via INTRAVENOUS

## 2024-03-27 MED ORDER — BUPIVACAINE-EPINEPHRINE (PF) 0.25% -1:200000 IJ SOLN
INTRAMUSCULAR | Status: AC
  Filled 2024-03-27: qty 30

## 2024-03-27 MED ORDER — TETANUS-DIPHTH-ACELL PERTUSSIS 5-2.5-18.5 LF-MCG/0.5 IM SUSY: 0.5000 mL | PREFILLED_SYRINGE | INTRAMUSCULAR | Status: DC | PRN

## 2024-03-27 MED ORDER — HYDROMORPHONE HCL 1 MG/ML IJ SOLN
INTRAMUSCULAR | Status: AC
  Filled 2024-03-27: qty 1

## 2024-03-27 MED ORDER — PROPOFOL 10 MG/ML IV BOLUS
INTRAVENOUS | Status: AC
  Filled 2024-03-27: qty 20

## 2024-03-27 MED ORDER — HYDROMORPHONE HCL 1 MG/ML IJ SOLN: 1.0000 mg | Freq: Once | INTRAMUSCULAR | Status: DC

## 2024-03-27 MED ORDER — LACTATED RINGERS IV SOLN: INTRAVENOUS | Status: DC

## 2024-03-27 MED ORDER — FENTANYL CITRATE (PF) 100 MCG/2ML IJ SOLN
INTRAMUSCULAR | Status: AC
  Filled 2024-03-27: qty 2

## 2024-03-27 MED ORDER — FENTANYL CITRATE (PF) 250 MCG/5ML IJ SOLN
INTRAMUSCULAR | Status: AC
  Filled 2024-03-27: qty 5

## 2024-03-27 MED ORDER — ALBUMIN HUMAN 5 % IV SOLN: INTRAVENOUS | Status: DC | PRN

## 2024-03-27 MED ORDER — LIDOCAINE 2% (20 MG/ML) 5 ML SYRINGE
INTRAMUSCULAR | Status: DC | PRN
  Administered 2024-03-27: 100 mg via INTRAVENOUS

## 2024-03-27 MED ORDER — MENTHOL 3 MG MT LOZG: 1.0000 | LOZENGE | OROMUCOSAL | Status: DC | PRN

## 2024-03-27 MED ORDER — FENTANYL CITRATE (PF) 100 MCG/2ML IJ SOLN
25.0000 ug | INTRAMUSCULAR | Status: DC | PRN
  Administered 2024-03-27 (×2): 50 ug via INTRAVENOUS

## 2024-03-27 MED ORDER — MAGNESIUM SULFATE 40 GM/1000ML IV SOLN
INTRAVENOUS | Status: AC
Start: 2024-03-27 — End: 2024-03-27
  Administered 2024-03-27: 6 g via INTRAVENOUS
  Filled 2024-03-27: qty 1000

## 2024-03-27 MED ORDER — ACETYLCYSTEINE LOAD VIA INFUSION
150.0000 mg/kg | Freq: Once | INTRAVENOUS | Status: AC
  Administered 2024-03-27: 9015 mg via INTRAVENOUS

## 2024-03-27 MED ORDER — IOHEXOL 350 MG/ML SOLN
75.0000 mL | Freq: Once | INTRAVENOUS | Status: AC | PRN
  Administered 2024-03-27: 75 mL via INTRAVENOUS

## 2024-03-27 MED ORDER — SENNOSIDES-DOCUSATE SODIUM 8.6-50 MG PO TABS
2.0000 | ORAL_TABLET | Freq: Every day | ORAL | Status: DC
  Administered 2024-03-29 – 2024-04-01 (×4): 2 via ORAL
  Filled 2024-03-27 (×4): qty 2

## 2024-03-27 MED ORDER — SODIUM CHLORIDE 0.9 % IV SOLN: INTRAVENOUS | Status: DC | PRN

## 2024-03-27 MED ORDER — LABETALOL HCL 5 MG/ML IV SOLN: 40.0000 mg | INTRAVENOUS | Status: DC | PRN

## 2024-03-27 MED ORDER — DEXMEDETOMIDINE HCL IN NACL 80 MCG/20ML IV SOLN
INTRAVENOUS | Status: DC | PRN
  Administered 2024-03-27: 12 ug via INTRAVENOUS

## 2024-03-27 MED ORDER — SUCCINYLCHOLINE CHLORIDE 200 MG/10ML IV SOSY
PREFILLED_SYRINGE | INTRAVENOUS | Status: DC | PRN
  Administered 2024-03-27: 70 mg via INTRAVENOUS

## 2024-03-27 MED ORDER — LACTATED RINGERS IV SOLN
INTRAVENOUS | Status: DC
Start: 2024-03-27 — End: 2024-03-27

## 2024-03-27 MED ORDER — DEXTROSE 5 % IV SOLN
12.5000 mg/kg/h | INTRAVENOUS | Status: AC
  Filled 2024-03-27: qty 90

## 2024-03-27 MED ORDER — COCONUT OIL OIL
1.0000 | TOPICAL_OIL | Status: DC | PRN
  Administered 2024-03-30: 1 via TOPICAL

## 2024-03-27 MED ORDER — FENTANYL CITRATE (PF) 100 MCG/2ML IJ SOLN: INTRAMUSCULAR | Status: DC | PRN

## 2024-03-27 MED ORDER — CHLORHEXIDINE GLUCONATE CLOTH 2 % EX PADS: 6.0000 | MEDICATED_PAD | Freq: Every day | CUTANEOUS | Status: DC

## 2024-03-27 MED ORDER — LACTATED RINGERS IV SOLN: INTRAVENOUS | Status: AC

## 2024-03-27 MED ORDER — DIPHENHYDRAMINE HCL 25 MG PO CAPS
25.0000 mg | ORAL_CAPSULE | Freq: Four times a day (QID) | ORAL | Status: DC | PRN
  Administered 2024-03-28 – 2024-03-29 (×3): 25 mg via ORAL
  Filled 2024-03-27 (×3): qty 1

## 2024-03-27 MED ORDER — SODIUM CHLORIDE 0.9 % IR SOLN
Status: DC | PRN
  Administered 2024-03-27: 1

## 2024-03-27 MED ORDER — MAGNESIUM SULFATE 40 GM/1000ML IV SOLN
2.0000 g/h | INTRAVENOUS | Status: DC
  Administered 2024-03-27: 2 g/h via INTRAVENOUS

## 2024-03-27 MED ORDER — HYDROMORPHONE HCL 1 MG/ML IJ SOLN
2.0000 mg | Freq: Once | INTRAMUSCULAR | Status: AC
  Administered 2024-03-27: 2 mg via INTRAVENOUS
  Filled 2024-03-27: qty 2

## 2024-03-27 MED ORDER — CALCIUM GLUCONATE-NACL 2-0.675 GM/100ML-% IV SOLN
2.0000 g | Freq: Once | INTRAVENOUS | Status: DC
  Filled 2024-03-27: qty 100

## 2024-03-27 MED ORDER — DIBUCAINE (PERIANAL) 1 % EX OINT: 1.0000 | TOPICAL_OINTMENT | CUTANEOUS | Status: DC | PRN

## 2024-03-27 MED ORDER — MEASLES, MUMPS & RUBELLA VAC IJ SOLR: 0.5000 mL | INTRAMUSCULAR | Status: DC | PRN

## 2024-03-27 MED ORDER — SODIUM CHLORIDE 0.9 % IV SOLN
2.0000 g | INTRAVENOUS | Status: AC
  Administered 2024-03-27: 2 g via INTRAVENOUS
  Filled 2024-03-27: qty 20

## 2024-03-27 MED ORDER — TRANEXAMIC ACID-NACL 1000-0.7 MG/100ML-% IV SOLN
INTRAVENOUS | Status: DC | PRN
  Administered 2024-03-27: 1000 mg via INTRAVENOUS

## 2024-03-27 MED ORDER — ONDANSETRON HCL 4 MG/2ML IJ SOLN: 4.0000 mg | Freq: Once | INTRAMUSCULAR | Status: DC | PRN

## 2024-03-27 MED ORDER — FENTANYL CITRATE (PF) 250 MCG/5ML IJ SOLN
INTRAMUSCULAR | Status: DC | PRN
  Administered 2024-03-27: 100 ug via INTRAVENOUS
  Administered 2024-03-27 (×3): 50 ug via INTRAVENOUS

## 2024-03-27 MED ORDER — ONDANSETRON HCL 4 MG/2ML IJ SOLN
INTRAMUSCULAR | Status: DC | PRN
  Administered 2024-03-27: 4 mg via INTRAVENOUS

## 2024-03-27 MED ORDER — FENTANYL CITRATE PF 50 MCG/ML IJ SOSY: 50.0000 ug | PREFILLED_SYRINGE | INTRAMUSCULAR | Status: DC | PRN

## 2024-03-27 MED ORDER — MAGNESIUM SULFATE BOLUS VIA INFUSION
6.0000 g | Freq: Once | INTRAVENOUS | Status: AC
  Filled 2024-03-27: qty 1000

## 2024-03-27 MED ORDER — ACETAMINOPHEN 325 MG PO TABS: 650.0000 mg | ORAL_TABLET | ORAL | Status: DC | PRN

## 2024-03-27 MED ORDER — WITCH HAZEL-GLYCERIN EX PADS: 1.0000 | MEDICATED_PAD | CUTANEOUS | Status: DC | PRN

## 2024-03-27 SURGICAL SUPPLY — 44 items
BENZOIN TINCTURE PRP APPL 2/3 (GAUZE/BANDAGES/DRESSINGS) ×1 IMPLANT
BLADE CLIPPER SURG (BLADE) IMPLANT
CANISTER SUCTION 3000ML PPV (SUCTIONS) ×2 IMPLANT
CHLORAPREP W/TINT 26 (MISCELLANEOUS) ×2 IMPLANT
CLIP APPLIE 5 13 M/L LIGAMAX5 (MISCELLANEOUS) ×2 IMPLANT
COVER SURGICAL LIGHT HANDLE (MISCELLANEOUS) ×2 IMPLANT
DERMABOND ADVANCED .7 DNX12 (GAUZE/BANDAGES/DRESSINGS) ×2 IMPLANT
DISSECTOR BLUNT TIP ENDO 5MM (MISCELLANEOUS) IMPLANT
DRSG OPSITE POSTOP 4X10 (GAUZE/BANDAGES/DRESSINGS) ×1 IMPLANT
ELECT CAUTERY BLADE 6.4 (BLADE) ×2 IMPLANT
ELECTRODE REM PT RTRN 9FT ADLT (ELECTROSURGICAL) ×2 IMPLANT
GAUZE PAD ABD 8X10 STRL (GAUZE/BANDAGES/DRESSINGS) ×1 IMPLANT
GAUZE SPONGE 4X4 12PLY STRL (GAUZE/BANDAGES/DRESSINGS) ×1 IMPLANT
GLOVE BIO SURGEON STRL SZ 6.5 (GLOVE) ×2 IMPLANT
GLOVE BIOGEL PI IND STRL 6 (GLOVE) ×2 IMPLANT
GOWN STRL REUS W/ TWL LRG LVL3 (GOWN DISPOSABLE) ×6 IMPLANT
HEMOSTAT ARISTA ABSORB 3G PWDR (HEMOSTASIS) ×1 IMPLANT
IRRIGATION SUCT STRKRFLW 2 WTP (MISCELLANEOUS) IMPLANT
KIT BASIN OR (CUSTOM PROCEDURE TRAY) ×2 IMPLANT
KIT IMAGING PINPOINTPAQ (MISCELLANEOUS) IMPLANT
KIT TURNOVER KIT B (KITS) ×2 IMPLANT
NS IRRIG 1000ML POUR BTL (IV SOLUTION) ×2 IMPLANT
PAD ARMBOARD POSITIONER FOAM (MISCELLANEOUS) ×2 IMPLANT
PENCIL BUTTON HOLSTER BLD 10FT (ELECTRODE) ×2 IMPLANT
POUCH RETRIEVAL ECOSAC 10 (ENDOMECHANICALS) ×2 IMPLANT
SCISSORS LAP 5X35 DISP (ENDOMECHANICALS) ×2 IMPLANT
SET TUBE SMOKE EVAC HIGH FLOW (TUBING) ×2 IMPLANT
SLEEVE Z-THREAD 5X100MM (TROCAR) ×4 IMPLANT
STRIP CLOSURE SKIN 1/2X4 (GAUZE/BANDAGES/DRESSINGS) ×1 IMPLANT
SUT ETHILON 2 0 FS 18 (SUTURE) ×1 IMPLANT
SUT ETHILON 2 0 PSLX (SUTURE) ×1 IMPLANT
SUT MNCRL AB 4-0 PS2 18 (SUTURE) ×2 IMPLANT
SUT PLAIN ABS 2-0 CT1 27XMFL (SUTURE) ×1 IMPLANT
SUT PROLENE 2 0 CT 30 (SUTURE) ×1 IMPLANT
SUT VIC AB 0 UR5 27 (SUTURE) IMPLANT
SUT VIC AB 2-0 CT1 TAPERPNT 27 (SUTURE) ×1 IMPLANT
SUT VIC AB 4-0 KS 27 (SUTURE) ×1 IMPLANT
SUT VICRYL 0 AB UR-6 (SUTURE) IMPLANT
TOWEL GREEN STERILE FF (TOWEL DISPOSABLE) ×2 IMPLANT
TRAY LAPAROSCOPIC MC (CUSTOM PROCEDURE TRAY) ×2 IMPLANT
TROCAR BALLN 12MMX100 BLUNT (TROCAR) ×2 IMPLANT
TROCAR Z-THREAD OPTICAL 5X100M (TROCAR) ×2 IMPLANT
WARMER LAPAROSCOPE (MISCELLANEOUS) ×2 IMPLANT
WATER STERILE IRR 1000ML POUR (IV SOLUTION) ×2 IMPLANT

## 2024-03-27 NOTE — Consult Note (Signed)
 Toni Klein May 16, 2001  [id number].    Requesting MD: Dr. Lacey Pian Chief Complaint/Reason for Consult: Cholecystitis   In-person Spanish interpreter was used for the entirety of this visit  HPI: Toni Klein is a 23 y.o. female who is [redacted]w[redacted]d pregnant who we are asked to see for cholecystitis.  Patient presented this morning via EMS with epigastric abdominal pain.  She reports around 3am she began having severe, constant upper abdominal pain that is worst in the RUQ/Epigastrium.  She had associated n/v x2. Last BM last night. No hx of similar symptoms in the past. No vaginal bleeding or discharge. Pt does have HA and some visual changes (flashes). She has had no prior gallbladder symptoms or issues with post prandial N/V or abdominal pain.  She underwent workup where she was found to be afebrile, tachcyardic (106 on last check) and without hypotension, but actually hypertensive. WBC 14. Lipase 25. Amylase 88. Alk Phos 230, AST 2157, ALT 1214, T. Bili wnl at 1.0. LFT's were wnl aside from an Alk Phos of 177 on prior labs done 01/18/24. RUQ US  w/ no gallstones and a normal CBD diameter but abnormal appearance of the gallbladder with asymmetric wall thickening irregular and thick membranous internal contents which are non-shadowing and concerning for acalculous cholecystitis, possible gangrenous cholecystitis. Liver wnl and portal vein patent. A hepatitis panel is in progress.   FHT 140 on arrival and OB has evaluated her. Protein Cr ratio 6.75. Plt 163k. Cr 0.6. HTN here w/ BP max 158/94.   We were asked to consult for possible Cholecystitis. OB is planning C-section today.   Past Medical History: None except as stated above Prior Abdominal Surgeries: None Blood Thinners: Baby ASA, otherwise none Last PO intake: yesterday Allergies: NKDA Tobacco Use: None Alcohol Use: None Substance use: None    ROS: ROS As above, see hpi  Family History   Problem Relation Age of Onset   Healthy Mother    Healthy Father     Past Medical History:  Diagnosis Date   Ectopic pregnancy    Methotrexate     Past Surgical History:  Procedure Laterality Date   NO PAST SURGERIES     TOOTH EXTRACTION      Social History:  reports that she has never smoked. She has never used smokeless tobacco. She reports that she does not drink alcohol and does not use drugs.  Allergies: No Known Allergies  Medications Prior to Admission  Medication Sig Dispense Refill   aspirin  EC 81 MG tablet Take 1 tablet (81 mg total) by mouth daily. Swallow whole. 30 tablet 5   Prenatal Vit-Fe Fumarate-FA (MULTIVITAMIN-PRENATAL) 27-0.8 MG TABS tablet Take 1 tablet by mouth daily at 12 noon.     cholecalciferol (VITAMIN D3) 25 MCG (1000 UNIT) tablet Take 1,000 Units by mouth daily. (Patient not taking: Reported on 12/18/2023)     doxylamine, Sleep, (UNISOM) 25 MG tablet Take 25 mg by mouth at bedtime as needed. (Patient not taking: Reported on 12/18/2023)     ondansetron  (ZOFRAN ) 4 MG tablet Take 2 tablets (8 mg total) by mouth 2 (two) times daily. (Patient not taking: Reported on 02/01/2024) 20 tablet 0     Physical Exam: Blood pressure 131/87, pulse (!) 106, temperature 98.4 F (36.9 C), temperature source Oral, resp. rate 20, weight 58 kg, last menstrual period 07/15/2023, SpO2 98%, unknown if currently breastfeeding. General: pleasant, WD/WN female who is laying in bed in NAD on her left  side HEENT: head is normocephalic, atraumatic.  Sclera are non-icteric. Ears and nose without any obvious masses or lesions.  Mouth is pink. Dentition fair Heart: regular rhythm, but tachy.   Lungs: CTAB, no wheezes, rhonchi, or rales noted.  Respiratory effort nonlabored Abd:  Soft, gravid abdomen with ttp in the epigastrium and ruq by report but currently nontender after pain medication. No masses, hernias noted MS: no BUE or BLE edema Skin: warm and dry  Psych: A&Ox4 with an  appropriate affect Neuro: normal speech, thought process intact, moves all extremities, gait not assessed   Results for orders placed or performed during the hospital encounter of 03/27/24 (from the past 48 hours)  Type and screen [facility]     Status: None   Collection Time: 03/27/24  8:40 AM  Result Value Ref Range   ABO/RH(D) A POS    Antibody Screen NEG    Sample Expiration      03/30/2024,2359 Performed at [facility], [address] [place], [place] [address]   CBC     Status: Abnormal   Collection Time: 03/27/24  8:47 AM  Result Value Ref Range   WBC 14.0 (H) 4.0 - 10.5 K/uL   RBC 4.58 3.87 - 5.11 MIL/uL   Hemoglobin 14.2 12.0 - 15.0 g/dL   HCT 96.2 95.2 - 84.1 %   MCV 89.3 80.0 - 100.0 fL   MCH 31.0 26.0 - 34.0 pg   MCHC 34.7 30.0 - 36.0 g/dL   RDW 32.4 40.1 - 02.7 %   Platelets 163 150 - 400 K/uL   nRBC 0.0 0.0 - 0.2 %    Comment: Performed at [facility], [address] [place], [place] [address]  Comprehensive metabolic panel     Status: Abnormal   Collection Time: 03/27/24  8:47 AM  Result Value Ref Range   Sodium 135 135 - 145 mmol/L   Potassium 3.8 3.5 - 5.1 mmol/L   Chloride 107 98 - 111 mmol/L   CO2 17 (L) 22 - 32 mmol/L   Glucose, Bld 120 (H) 70 - 99 mg/dL    Comment: Glucose reference range applies only to samples taken after fasting for at least 8 hours.   BUN 10 6 - 20 mg/dL   Creatinine, Ser 4.40 0.44 - 1.00 mg/dL   Calcium 8.4 (L) 8.9 - 10.3 mg/dL   Total Protein 6.5 6.5 - 8.1 g/dL   Albumin 3.0 (L) 3.5 - 5.0 g/dL   AST 3,474 (H) 15 - 41 U/L   ALT 1,214 (H) 0 - 44 U/L   Alkaline Phosphatase 230 (H) 38 - 126 U/L   Total Bilirubin 1.0 0.0 - 1.2 mg/dL   GFR, Estimated >25 >95 mL/min    Comment: (NOTE) Calculated using the CKD-EPI Creatinine Equation (2021)    Anion gap 11 5 - 15    Comment: Performed at [facility], [address] [place], [place] [address]  Protein / creatinine ratio, urine     Status:  Abnormal   Collection Time: 03/27/24  8:47 AM  Result Value Ref Range   Creatinine, Urine 79 mg/dL   Total Protein, Urine 533 mg/dL    Comment: RESULT CONFIRMED BY MANUAL DILUTION NO NORMAL RANGE ESTABLISHED FOR THIS TEST    Protein Creatinine Ratio 6.75 (H) 0.00 - 0.15 mg/mg[Cre]    Comment: Performed at [facility], [address] [place], [place] [address]  Amylase  Status: None   Collection Time: 03/27/24  8:47 AM  Result Value Ref Range   Amylase 88 28 - 100 U/L    Comment: Performed at [facility], [address], [place], [place] [address]  Lipase, blood     Status: None   Collection Time: 03/27/24  8:47 AM  Result Value Ref Range   Lipase 25 11 - 51 U/L    Comment: Performed at [facility], [address] [place], [place] [address]  Protime-INR     Status: Abnormal   Collection Time: 03/27/24  9:27 AM  Result Value Ref Range   Prothrombin Time 15.8 (H) 11.4 - 15.2 seconds   INR 1.2 0.8 - 1.2    Comment: (NOTE) INR goal varies based on device and disease states. Performed at [facility], [address], [place], [place] [address]   APTT     Status: None   Collection Time: 03/27/24  9:27 AM  Result Value Ref Range   aPTT 31 24 - 36 seconds    Comment: Performed at [facility], [address] [place], [place] [address]   US  Abdomen Limited RUQ (LIVER/GB) Addendum Date: 03/27/2024 ADDENDUM REPORT: 03/27/2024 11:02 ADDENDUM: The original report was by Dr. Freida Jes. The following addendum is by Dr. Freida Jes: These results were called by telephone at the time of interpretation on 03/27/2024 at 10:52 AM to provider Princess Brooks , who verbally acknowledged these results. Electronically Signed   By: Freida Jes M.D.   On: 03/27/2024 11:02   Result Date: 03/27/2024 CLINICAL DATA:  Abdominal pain.  Third trimester pregnancy. EXAM: ULTRASOUND ABDOMEN LIMITED RIGHT UPPER QUADRANT COMPARISON:  None Available. FINDINGS:  Gallbladder: Abnormal appearance of the gallbladder with asymmetric wall thickening irregular and thick membranous internal contents which are nonshadowing. Sonographic Murphy sign present. No gallstones observed. Common bile duct: Diameter: 0.2 cm Liver: No focal lesion identified. Within normal limits in parenchymal echogenicity. Portal vein is patent on color Doppler imaging with normal direction of blood flow towards the liver. Other: None. IMPRESSION: 1. Sonographic Abigail Abler sign present with asymmetric gallbladder wall thickening and membranous internal contents of the gallbladder. Appearance strongly favors acute cholecystitis and gangrenous cholecystitis is a distinct possibility. Urgent general surgical consultation recommended. Radiology assistant personnel have been notified to put me in telephone contact with the referring physician or the referring physician's clinical representative in order to discuss these findings. Once this communication is established I will issue an addendum to this report for documentation purposes. Electronically Signed: By: Freida Jes M.D. On: 03/27/2024 10:39   US  MFM OB LIMITED Result Date: 03/27/2024 ----------------------------------------------------------------------  OBSTETRICS REPORT                    (Corrected Final 03/27/2024 10:26 am) ---------------------------------------------------------------------- Patient Info  ID #:       [id number]                          D.O.B.:  2001/11/08 (23 yrs)(F)  Name:       Toni Klein          Visit Date: 03/27/2024 09:29 am              MENDOZA ---------------------------------------------------------------------- Performed By  Attending:        Penney Bowling DO       Ref. Address:     [facility]  Performed By:  Carlene Che          Secondary Phy.:   [facility] MAU/Triage                    RDMS  Referred By:      Mardee Shackle                 [facility] ---------------------------------------------------------------------- Orders  #  Description                           Code        Ordered By  1  US  MFM OB LIMITED                     76815.01    Princess Brooks ----------------------------------------------------------------------  #  Order #                     Accession #                Episode #  1  [id number]                   [id number]                 [id number] ---------------------------------------------------------------------- Indications  Abdominal pain in pregnancy                    O99.89  [redacted] weeks gestation of pregnancy                Z3A.36 ---------------------------------------------------------------------- Fetal Evaluation  Num Of Fetuses:         1  Fetal Heart Rate(bpm):  153  Cardiac Activity:       Observed  Presentation:           Cephalic  Placenta:               Posterior  P. Cord Insertion:      Previously seen  Amniotic Fluid  AFI FV:      Within normal limits  AFI Sum(cm)     %Tile       Largest Pocket(cm)  12.9            45          4.7  RUQ(cm)       RLQ(cm)       LUQ(cm)        LLQ(cm)  4.7           3.4           3.3            1.5  Comment:    No placental abruption or previa identified. ---------------------------------------------------------------------- OB History  Gravidity:    6         Term:   1         SAB:   3  Ectopic:      1        Living:  1 ---------------------------------------------------------------------- Gestational Age  LMP:  36w 4d        Date:  07/15/23                  EDD:   04/20/24  Best:          36w 4d     Det. By:  LMP  (07/15/23)          EDD:   04/20/24 ---------------------------------------------------------------------- Anatomy  Diaphragm:             Appears normal         Kidneys:                Appear normal  Stomach:               Appears normal, left   Bladder:                Appears normal                         sided  ---------------------------------------------------------------------- Cervix Uterus Adnexa  Cervix  Not visualized (advanced GA >24wks)  Uterus  No abnormality visualized.  Right Ovary  Within normal limits.  Left Ovary  Within normal limits.  Adnexa  No abnormality visualized ---------------------------------------------------------------------- Comments  Hospital Ultrasound  The patient presented to the MAU for abdominal pain that  was sudden onset and woke the patient from sleep. Liver  enzymes are 2,157 and 1,214 with normla bilirubin and plt  (163k). BP is slightly elevated but the patient is in extreme  pain per the [facility] provider.  Sonographic findings  Single intrauterine pregnancy at 36w 4d  Fetal cardiac activity: Observed and appears normal.  Presentation: Cephalic.  Limited fetal anatomy appears normal.  Amniotic fluid: Within normal limits.  MVP: 4.7 cm.  Placenta: Posterior. There is no sonographic evidence of  bleeding but the placenta appears very calcified.  Recommendations  - See MFM plan of care  This was a limited ultrasound with a remote read. If an official  MFM consult is requested for any reason please call/place an  order in Epic. ----------------------------------------------------------------------                      Penney Bowling, DO Electronically Signed Corrected Final Report  03/27/2024 10:26 am ----------------------------------------------------------------------    Anti-infectives (From admission, onward)    None       Assessment/Plan Patient has been seen and examined.  Vitals, labs, imaging and notes reviewed.  Toni Klein is a 23 y.o. female who is [redacted]w[redacted]d pregnant who we are asked to see for possible cholecystitis.  She presented today with severe upper abdominal pain, n/v.  Here she has noted to be afebrile, tachycardic without hypotension.WBC 14. Lipase 25. Amylase 88. Alk Phos 230, AST 2157, ALT 1214, T. Bili wnl at 1.0. LFT's were wnl aside from an  Alk Phos of 177 on prior labs done 01/18/24. RUQ US  w/ no gallstones and a normal CBD diameter but abnormal appearance of the gallbladder with asymmetric wall thickening irregular and thick membranous internal contents which are non-shadowing and concerning for acalculous cholecystitis, possible gangrenous cholecystitis. Liver wnl and portal vein patent.   Her workup that has progressed since initial call shows evidence of preeclampsia/HELLP with a HA, visual changes, n/v, Protein Cr ratio 6.75 and pt being HTN here w/ BP max 158/94. Hepatitis panel and additional labs for other etiologies of her elevated LFT's are pending, but suspect this is  related to pregnancy.   Current plan after further discussion is to proceed with emergent C-section by OB for preeclampsia.  Given risks associated with this and that the patient has no biliary symptoms and findings may be skewed due to pregnancy, we will plan to hold on surgery and proceed with a perc chole tube placement as the safest course of action at this time.  It would be highly unlikely for the patient to develop acute symptoms at 0300am this morning and already have gangrenous cholecystitis without any gallstones as well.  Will start Rocephin for cholecystitis and speak to IR about possible drain placement to follow c-section.  Given she has no stones, her drain may also be definitive management for her as well to avoid any surgery for her gallbladder moving forward.  We can further discuss this with the patient at a later date.  This was all discussed with the OB GYN team, CCM, anesthesia, and bedside RN.  FEN - NPO for OR VTE - SCDs ID - Rocephin   I reviewed ED provider notes, Consultant OBGYN notes, last 24 h vitals and pain scores, last 48 h intake and output, last 24 h labs and trends, and last 24 h imaging results.   Leone Ralphs, [facility] Surgery 03/27/2024, 11:15 AM Please see Amion for pager number during day hours  7:00am-4:30pm

## 2024-03-27 NOTE — Anesthesia Postprocedure Evaluation (Signed)
 Anesthesia Post Note  Patient: Toni Klein  Procedure(s) Performed: CESAREAN DELIVERY (Abdomen) EXAM UNDER ANESTHESIA, ABDOMINAL WITH CHOLECYSTOTOMY TUBE INSERTION (Abdomen)     Patient location during evaluation: PACU Anesthesia Type: General Level of consciousness: awake and alert Pain management: pain level controlled Vital Signs Assessment: post-procedure vital signs reviewed and stable Respiratory status: spontaneous breathing, nonlabored ventilation, respiratory function stable and patient connected to nasal cannula oxygen Cardiovascular status: blood pressure returned to baseline and stable Postop Assessment: no apparent nausea or vomiting Anesthetic complications: no  No notable events documented.  Last Vitals:  Vitals:   03/27/24 1430 03/27/24 1445  BP: 113/77 120/74  Pulse: (!) 126 (!) 123  Resp: 20 (!) 26  Temp:    SpO2: 94% 94%    Last Pain:  Vitals:   03/27/24 1505  TempSrc:   PainSc: 5    Pain Goal:                   Rosalita Combe

## 2024-03-27 NOTE — Anesthesia Procedure Notes (Signed)
 Procedure Name: Intubation Date/Time: 03/27/2024 12:41 PM  Performed by: Alphia Jasmine, CRNAPre-anesthesia Checklist: Patient identified, Patient being monitored, Timeout performed, Emergency Drugs available and Suction available Patient Re-evaluated:Patient Re-evaluated prior to induction Oxygen Delivery Method: Circle System Utilized Preoxygenation: Pre-oxygenation with 100% oxygen Induction Type: IV induction Ventilation: Mask ventilation without difficulty Laryngoscope Size: 3 and Glidescope Grade View: Grade I Tube type: Oral Tube size: 6.5 mm Number of attempts: 1 Airway Equipment and Method: stylet Placement Confirmation: ETT inserted through vocal cords under direct vision, positive ETCO2 and breath sounds checked- equal and bilateral Secured at: 21 cm Tube secured with: Tape Dental Injury: Teeth and Oropharynx as per pre-operative assessment

## 2024-03-27 NOTE — Progress Notes (Signed)
 03/27/2024 Called RE: case Agree LFTs unusual Sounds like concurrent chole + eclampsi + ?HELLP. Bedside echo vigorous function. Confirmed with radiology that portal vein is patent. Acute hepatitis panel pending. Discussed with primary, will be available if postop critical care management needed.  Ardelle Kos MD PCCM

## 2024-03-27 NOTE — Progress Notes (Signed)
 eLink Physician-Brief Progress Note Patient Name: Toni Klein DOB: June 14, 2001 MRN: [id number]   Date of Service  03/27/2024  HPI/Events of Note  CBC and DIC panel results reviewed.  eICU Interventions          Marlynn Singer 03/27/2024, 11:14 PM

## 2024-03-27 NOTE — Brief Op Note (Signed)
 03/27/2024  1:42 PM  PATIENT:  Toni Klein  23 y.o. female  PRE-OPERATIVE DIAGNOSIS:  23 yo G6 P1041 at 36 weeks 4 days with HELLP needing immediate delivery to address gangrenous cholecystitis.   POST-OPERATIVE DIAGNOSIS:  Same  PROCEDURE:  Procedure(s): CESAREAN DELIVERY (N/A) EXAM UNDER ANESTHESIA, ABDOMINAL WITH CHOLECYSTOTOMY TUBE INSERTION (N/A)  SURGEON:  Surgeons and Role: Panel 1:    * Rik Chasten, MD - Primary    * Cresenzo, Mardee Shackle, MD - Assisting Panel 2:    * Anda Bamberg, MD - Primary  ANESTHESIA:   general  EBL:  400 cc  BLOOD ADMINISTERED:none  DRAINS: Foley catheter placed in gallbladder by Dr. Aniceto Barley   LOCAL MEDICATIONS USED:  NONE  SPECIMEN:  Source of Specimen:  placenta to pathology  DISPOSITION OF SPECIMEN:  PATHOLOGY  COUNTS:  YES  TOURNIQUET:  * No tourniquets in log *  DICTATION: .Note written in EPIC  PLAN OF CARE: Admit to inpatient   PATIENT DISPOSITION:  PACU - guarded condition.   Delay start of Pharmacological VTE agent (>24hrs) due to surgical blood loss or risk of bleeding: yes--in DIC

## 2024-03-27 NOTE — Progress Notes (Signed)
 Arterial line placed before coming to 2M10

## 2024-03-27 NOTE — H&P (Signed)
 PI Patient is a 23 year old G6 P1-0-4-1 at 36 weeks 4 days presenting for acute severe abdominal pain.  Reports that 3 AM she started having severe upper abdominal pain that started in the right epigastric area and is now radiating to the left side.  Denies any pressure or contractions.  Is also having nausea and vomiting.  Reports a burning pain as well.  Because of the pain she has not been feeling movements.  Headache started while en route to the hospital.  It is nos 10/10.  Pt seeing some flashes.  Previous uncomplicated term vaginal delivery.   OB History       Gravida  6   Para  1   Term  1   Preterm  0   AB  4   Living  1        SAB  3   IAB  0   Ectopic  1   Multiple      Live Births  1                   Past Medical History:  Diagnosis Date   Ectopic pregnancy      Methotrexate                Past Surgical History:  Procedure Laterality Date   NO PAST SURGERIES       TOOTH EXTRACTION                   Family History  Problem Relation Age of Onset   Healthy Mother     Healthy Father            Social History  Social History        Tobacco Use   Smoking status: Never   Smokeless tobacco: Never  Vaping Use   Vaping status: Never Used  Substance Use Topics   Alcohol use: Never   Drug use: Never        Allergies:  Allergies  No Known Allergies            Medications Prior to Admission  Medication Sig Dispense Refill Last Dose/Taking   aspirin  EC 81 MG tablet Take 1 tablet (81 mg total) by mouth daily. Swallow whole. 30 tablet 5 03/26/2024   Prenatal Vit-Fe Fumarate-FA (MULTIVITAMIN-PRENATAL) 27-0.8 MG TABS tablet Take 1 tablet by mouth daily at 12 noon.     03/26/2024   cholecalciferol (VITAMIN D3) 25 MCG (1000 UNIT) tablet Take 1,000 Units by mouth daily. (Patient not taking: Reported on 12/18/2023)         doxylamine, Sleep, (UNISOM) 25 MG tablet Take 25 mg by mouth at bedtime as needed. (Patient not taking: Reported on  12/18/2023)         ondansetron  (ZOFRAN ) 4 MG tablet Take 2 tablets (8 mg total) by mouth 2 (two) times daily. (Patient not taking: Reported on 02/01/2024) 20 tablet 0            Review of Systems  Gastrointestinal:  Positive for abdominal pain, nausea and vomiting.  Genitourinary:  Negative for vaginal bleeding, vaginal discharge and vaginal pain.  Neurological:  Positive for headaches.    Physical Exam    Blood pressure (!) 158/94, pulse 69, temperature 98.4 F (36.9 C), temperature source Oral, resp. rate 20, weight 58 kg, last menstrual period 07/15/2023, SpO2 100%, unknown if currently breastfeeding.   Physical Exam Vitals and nursing note reviewed. Exam conducted with a chaperone present.  Constitutional:  General: She is in acute distress.     Appearance: She is ill-appearing.  HENT:     Head: Normocephalic.     Mouth/Throat:     Mouth: Mucous membranes are moist.  Cardiovascular:     Rate and Rhythm: Tachycardia present.  Pulmonary:     Effort: Pulmonary effort is normal.  Abdominal:     General: Bowel sounds are normal.     Palpations: Abdomen is rigid.     Tenderness: There is abdominal tenderness in the right upper quadrant, epigastric area and left upper quadrant.     Comments: gravid  Genitourinary:    Comments: 1.5/70/-2 Skin:    General: Skin is warm.  Neurological:     General: No focal deficit present.     Mental Status: She is alert.      Vitals:   03/27/24 1045 03/27/24 1101 03/27/24 1115 03/27/24 1130  BP: (!) 136/99 131/87 (!) 130/95 (!) 133/99  Pulse: (!) 111 (!) 106 (!) 117 (!) 124  Resp:      Temp:      TempSrc:      SpO2: 98%  96% 95%  Weight:          MAU Course  Procedures   MDM CBC CMP PC ratio Amylase Lipase PT/INR APTT Fibrinogen -type and screen   Assessment and Plan  23 yo G6P1041 at 36 weeks 4 days with severe Pre E and cholecystis / gangrenous cholecystis.     PCCM and general surgery consult--pt needs to be  delivered immediately and cannot wait for vaginal delivery.  Joint procedure planned with low transverse c/s and LSC cholecystectomy.   Severe Pre E/HELLP--mag load and labs q 4 hours.  Hep panel drawn--no hx of Hep and no substance use.  Vertex confirmed by US . US  April 17th--12% 4 pounds 5 ounces. NICU aware and will attend delivery. Fibrinogen low for pregnancy; will repeat with labs.  PT slightly elevated.     The risks of cesarean section discussed with the patient included but were not limited to: bleeding which may require transfusion or reoperation; infection which may require antibiotics; injury to bowel, bladder, ureters or other surrounding organs; injury to the fetus; need for additional procedures including hysterectomy in the event of a life-threatening hemorrhage; placental abnormalities wth subsequent pregnancies, incisional problems, thromboembolic phenomenon and other postoperative/anesthesia complications. The patient concurred with the proposed plan, giving informed written consent for the procedure.

## 2024-03-27 NOTE — Op Note (Signed)
 Cesarean Section Operative Note   Patient: Toni Klein  Date of Procedure: 03/27/2024  Procedure: Primary Low Transverse Cesarean with cholecystostomy tube insertion   Indications: severe preeclampsia and gangrenous cholecystitis  Pre-operative Diagnosis: Gangrenous cholecystitis.   Post-operative Diagnosis: Same  TOLAC Candidate: Yes   Surgeon: Surgeons and Role: Panel 1:    * Rik Chasten, MD - Primary    * Randel Buss, Mardee Shackle, MD - Assisting Panel 2:    * Anda Bamberg, MD - Primary  Assistants: none  An experienced assistant was required given the standard of surgical care given the complexity of the case.  This assistant was needed for exposure, dissection, suctioning, retraction, instrument exchange, assisting with delivery with administration of fundal pressure, and for overall help during the procedure.   Anesthesia: general  Anesthesiologist: Rosalita Combe, MD   Antibiotics: Ancef, ceftriaxone   Estimated Blood Loss: 400 ml   Total IV Fluids: 2800 ml  Urine Output: 150 cc OF clear urine  Specimens: None  Complications: no complications   Indications: Toni Abigail Tumeka Aseltine is a 23 y.o. [phone] with an IUP [redacted]w[redacted]d presenting for unscheduled, emergent cesarean secondary to the indications listed above. Clinical course notable for presentation to MAU for right upper quadrant pain.  Ultrasound concerning for gangrenous cholecystitis.  Markedly elevated LFTs.  PC ratio of 6.  Elevated blood pressures..  The risks of cesarean section discussed with the patient included but were not limited to: bleeding which may require transfusion or reoperation; infection which may require antibiotics; injury to bowel, bladder, ureters or other surrounding organs; injury to the fetus; need for additional procedures including hysterectomy in the event of a life-threatening hemorrhage; placental abnormalities with subsequent pregnancies, incisional  problems, thromboembolic phenomenon and other postoperative/anesthesia complications. The patient concurred with the proposed plan, giving informed written consent for the procedure. Patient NPO status waived given urgency of case. Anesthesia and OR aware. Preoperative prophylactic antibiotics and SCDs ordered on call to the OR.   Findings: Viable infant in cephalic presentation, nuchal x2. Apgars 3, 7, . Weight 2180 g. Moderate meconium stained amniotic fluid. Normal placenta, three vessel cord. Normal uterus, Normal bilateral fallopian tubes, Normal bilateral ovaries. No adhesive disease was encountered.  Procedure Details: A Time Out was held and the above information confirmed. The patient received intravenous antibiotics and had sequential compression devices applied to her lower extremities preoperatively. The patient was taken back to the operative suite where general anesthesia was administered. After induction of anesthesia, the patient was draped and prepped in the usual sterile manner and placed in a dorsal supine position with a leftward tilt. A low transverse skin incision was made with scalpel and carried down through the subcutaneous tissue to the fascia. Fascial incision was made and extended transversely. The fascia was separated from the underlying rectus tissue superiorly and inferiorly. The rectus muscles were separated in the midline bluntly and the peritoneum was entered bluntly. An Alexis retractor was placed to aid in visualization of the uterus. A bladder flap was not developed. A low transverse uterine incision was made. The infant was successfully delivered from cephalic presentation, the umbilical cord was clamped immediately. Cord ph was sent, and cord blood was obtained for evaluation. The placenta was removed Intact and appeared normal. The uterine incision was closed with a single layer running unlocked suture of 0-Vicryl. Due to ongoing bleeding a second layer of 0 Monocryl was  placed in an imbricating fashion.  Due to ongoing bleeding  from the hysterotomy figure of eight sutures of 0 Monocryl were placed after which there was excellent hemostasis.  At this point general surgery stepdown to place cholecystostomy tube.  Please see separate operative note for this for the procedure.  The abdomen and the pelvis were cleared of all clot and debris and the Trula Gable was removed. Hemostasis was confirmed on all surfaces.  The peritoneum was reapproximated using 2-0 vicryl . The fascia was then closed using 0 Vicryl in a running fashion. The subcutaneous layer was reapproximated with 2-0 plain gut suture. The skin was closed with a 4-0 vicryl subcuticular stitch. The patient tolerated the procedure well. Sponge, lap, instrument and needle counts were correct x 2. She was taken to the recovery room in stable condition.  Disposition: PACU - guarded condition.  Patient will be admitted to the ICU   Signed: Ferdie Housekeeper, MD Attending Family Medicine Physician, [facility] for [facility], [facility]

## 2024-03-27 NOTE — Progress Notes (Signed)
 eLink Physician-Brief Progress Note Patient Name: Toni Klein DOB: 03/10/2001 MRN: [id number]   Date of Service  03/27/2024  HPI/Events of Note  Lab results reviewed, corrected calcium 8.2 mg / dl, LFT's trending down except for bilirubin of 8.0, Mg++ 6.3 consistent with Rx for pre-eclampsia.  eICU Interventions  Calcium gluconate 2 gm iv x 1,  continue to trend LFT's and bilirubin, trend magnesium levels.        Avamarie Crossley U Krystale Rinkenberger 03/27/2024, 9:13 PM

## 2024-03-27 NOTE — Anesthesia Preprocedure Evaluation (Addendum)
 Anesthesia Evaluation  Patient identified by MRN, date of birth, ID band Patient awake    Reviewed: Allergy & Precautions, NPO status , Patient's Chart, lab work & pertinent test results  History of Anesthesia Complications Negative for: history of anesthetic complications  Airway Mallampati: I  TM Distance: >3 FB Neck ROM: Full    Dental no notable dental hx. (+) Teeth Intact, Dental Advisory Given   Pulmonary neg pulmonary ROS   Pulmonary exam normal breath sounds clear to auscultation       Cardiovascular hypertension (on Mg++), Normal cardiovascular exam Rhythm:Regular Rate:Normal     Neuro/Psych negative neurological ROS     GI/Hepatic Neg liver ROS,,,Lab Results      Component                Value               Date                      ALT                      1,214 (H)           03/27/2024                AST                      2,157 (H)           03/27/2024                ALKPHOS                  230 (H)             03/27/2024                BILITOT                  1.0                 03/27/2024           Lab Results      Component                Value               Date                      INR                      1.2                 03/27/2024              Endo/Other    Renal/GU      Musculoskeletal negative musculoskeletal ROS (+)    Abdominal   Peds  Hematology Lab Results      Component                Value               Date                      WBC                      14.0 (H)  03/27/2024                HGB                      14.2                03/27/2024                HCT                      40.9                03/27/2024                MCV                      89.3                03/27/2024                PLT                      163                 03/27/2024              Anesthesia Other Findings NKDA  Reproductive/Obstetrics (+) Pregnancy                              Anesthesia Physical Anesthesia Plan  ASA: 3 and emergent  Anesthesia Plan: General   Post-op Pain Management: Precedex   Induction: Intravenous, Rapid sequence and Cricoid pressure planned  PONV Risk Score and Plan: Treatment may vary due to age or medical condition, Ondansetron  and Dexamethasone  Airway Management Planned: Oral ETT  Additional Equipment: Arterial line  Intra-op Plan:   Post-operative Plan: Extubation in OR  Informed Consent: I have reviewed the patients History and Physical, chart, labs and discussed the procedure including the risks, benefits and alternatives for the proposed anesthesia with the patient or authorized representative who has indicated his/her understanding and acceptance.     Interpreter used for interview and Sales promotion account executive given  Plan Discussed with: CRNA, Surgeon and Anesthesiologist  Anesthesia Plan Comments: (36.4 Wk G6P1 w Gangrenous Cholecystitis ?HELLP, gHtn on Mg++ for Emergent C/S and Laparascopic Chloecystectomy under GA Spanish Speaking Hx taken with Translator.)        Anesthesia Quick Evaluation

## 2024-03-27 NOTE — MAU Note (Signed)
 Toni Klein is a 23 y.o. at [redacted]w[redacted]d here in MAU reporting: pt arrived by EMS, c/o constant upper abd pain, "burning".   No bleeding or leaking.  Unsure about baby moving.   No fever, had loose BM during the night. Regular care.  Prior term vag delivery, denies complication.  Denies problem with preg.  Did not have HA initially, started en route.   Ems reported BP 150's/100's, CBG 110.  Onset of complaint: 0645 Pain score: 10 abd and HA Vitals:   03/27/24 0815 03/27/24 0834  BP: (!) 143/84 (!) 158/94  Pulse: 84 69  Resp: 20   Temp: 98.4 F (36.9 C)   SpO2: 100%      FHT:140 Lab orders placed from triage:

## 2024-03-27 NOTE — Plan of Care (Addendum)
 MFM plan of Care - Late entry  I was contacted about this patient regarding her elevated liver enzymes.  There was extensive conversation regarding the management of her based on a presumptive diagnosis of either preeclampsia or a primary biliary problem.  After discussion with multiple services between radiology, general surgery and her OB provider it was decided that the patient likely has gangrenous cholecystitis and needs urgent surgery.  Due to the gestational age and need to promptly addressed the surgical problem a cesarean delivery was recommended followed by a cholecystectomy.  Liver enzymes can be trended after delivery. After speaking with Dr. Cresenzo, there seems to be concern for HELLP syndrome and she will be started on magnesium sulfate.   Penney Bowling, DO MFM

## 2024-03-27 NOTE — Op Note (Signed)
   Operative Note   Date: 03/27/2024  Procedure: open cholecystostomy tube placement  Pre-op diagnosis: acute cholecystitis Post-op diagnosis: same  Indication and clinical history: The patient is a 23 y.o. year old female with suspected acute cholecystitis based on US  and who is also with HELLP and severe pre-eclampsia at 36w gestation necessitating urgent delivery.     Surgeon: Anda Bamberg, MD  Anesthesiologist: Lasalle Pointer, MD Anesthesia: General  Findings:  Specimen: none EBL: <5cc Drains/Implants: 67F foley catheter in the gallbladder  Disposition: ICU  Description of procedure: The patient was positioned on the operating room table and Caesarean section performed by the obstetrics team.  After closure of the uterus, the abdomen was able to be inspected via the Pfannenstiel incision.  The gallbladder was able to be visualized and did not appear perforated or frankly gangrenous.  It was however distended so the decision was made to place a cholecystostomy tube.  A 10 French Foley catheter was inserted through the right upper quadrant abdominal wall.  Electrocautery was used to access the dome of the gallbladder and entry of the gallbladder was confirmed with return of bile.  There was some edema of the gallbladder wall consistent with cholecystitis.  The Foley catheter was then inserted into the gallbladder and the balloon inflated.  A Toomey syringe was used to instill saline and aspiration was attempted.  No return of bile was obtained, however given the instability of the patient, the decision was made to continue with the abdominal closure.  Please see separate operative note from the obstetric team regarding details of closure of the Pfannenstiel incision.    Case discussed with interventional radiology, will proceed with CT imaging and possible drain injection depending on imaging findings.  Upon entering the abdomen (organ space), I encountered infection of the  gallbladder.  CASE DATA:  Type of patient?: DOW CASE (Surgical Hospitalist [facility])  Status of Case? URGENT Add On  Infection Present At Time Of Surgery (PATOS)?  INFECTION of the gallbladder    Anda Bamberg, MD General and Trauma Surgery [facility]

## 2024-03-27 NOTE — Consult Note (Addendum)
 NAME:  Toni Klein, MRN:  [id number], DOB:  07/11/2001, LOS: 0 ADMISSION DATE:  03/27/2024, CONSULTATION DATE:  03/27/24 REFERRING MD:  Shira Dopp, CHIEF COMPLAINT:  abdominal pain   History of Present Illness:  Ms. Toni Klein is a 23 y.o. F with PMH significant for ectopic pregnancy G6 P1-0-4-1  currently [redacted] weeks pregnant who presented on 5/8 with severe abdominal pain that began in the epigastric area and began radiating to the L side with associated nausea and vomiting.  Her initial work-up showed significantly elevated LFT's AST 2157 and ALT 1214 with bili 1.0. lipase WNL,. WBC 14k.  RUQ US  showed thickened GB with positive sonographic Murphy sign, no stones.  CBD confirmed patent and hepatitis panel negative, tylenol  level <10.  She was taken to the OR for cesarean section and likely cholecystectomy, she was extubated post-operatively and transferred to the ICU   Pertinent  Medical History   has a past medical history of Ectopic pregnancy.   Significant Hospital Events: Including procedures, antibiotic start and stop dates in addition to other pertinent events   5/8 presented with abdominal pain, elevated LFT's, C-section, ? Cholecystitis, ICU transfer after OR  Interim History / Subjective:  Arrived to the ICU stable   Objective    Blood pressure 113/77, pulse (!) 126, temperature 98.4 F (36.9 C), temperature source Oral, resp. rate 20, weight 58 kg, last menstrual period 07/15/2023, SpO2 95%, unknown if currently breastfeeding.        Intake/Output Summary (Last 24 hours) at 03/27/2024 1435 Last data filed at 03/27/2024 1415 Gross per 24 hour  Intake 2800 ml  Output 600 ml  Net 2200 ml   Filed Weights   03/27/24 0815  Weight: 58 kg    General:  well-nourished young F, ill appearing but in NAD HEENT: MM pink/moist, sclera anicteric  Neuro: alert and oriented, answering questions appropriately via spanish interpretter CV: s1s2 tachycardic, regular, no  m/r/g PULM:  clear bilaterally on [place] GI: soft, bsx4 active, post-partum uterus with post-op site dressed and clean, JP drain in place with minimal dark sero-sanguinous drainage Extremities: warm/dry, no edema  Skin: no rashes or lesions  Resolved Hospital Problem list     Assessment & Plan:    Acute LFT elevation Hypertension, concern for gangrenous cholecystitis initially, likely HELLP syndrome vs pre-eclampsia  -monitor closely in the ICU for tonight -continue magnesium gtt with levels q4hrs, therapeutic goal 4.8-8.4 -serial CMP -lactic acid pending, check blood and urine cultures and add zosyn -continue IVF -BP control, currently systolics <100 -post-partum care per the primary team -monitor hgb, hct and platelets  -monitor UOP     Best Practice (right click and "Reselect all SmartList Selections" daily)   Diet/type: Regular consistency (see orders) DVT prophylaxis SCD Pressure ulcer(s): N/A GI prophylaxis: N/A Lines: N/A Foley:  Yes, and it is still needed Code Status:  full code Last date of multidisciplinary goals of care discussion [pending]  Labs   CBC: Recent Labs  Lab 03/27/24 0847 03/27/24 1317  WBC 14.0*  --   HGB 14.2 8.5*  HCT 40.9 25.0*  MCV 89.3  --   PLT 163  --     Basic Metabolic Panel: Recent Labs  Lab 03/27/24 0847 03/27/24 1317  NA 135 134*  K 3.8 4.2  CL 107  --   CO2 17*  --   GLUCOSE 120*  --   BUN 10  --   CREATININE 0.60  --   CALCIUM 8.4*  --  GFR: CrCl cannot be calculated (Unknown ideal weight.). Recent Labs  Lab 03/27/24 0847  WBC 14.0*    Liver Function Tests: Recent Labs  Lab 03/27/24 0847  AST 2,157*  ALT 1,214*  ALKPHOS 230*  BILITOT 1.0  PROT 6.5  ALBUMIN 3.0*   Recent Labs  Lab 03/27/24 0847  LIPASE 25  AMYLASE 88   Recent Labs  Lab 03/27/24 1058  AMMONIA 43*    ABG    Component Value Date/Time   PHART 7.231 (L) 03/27/2024 1317   PCO2ART 37.2 03/27/2024 1317   PO2ART 132 (H)  03/27/2024 1317   HCO3 15.8 (L) 03/27/2024 1317   TCO2 17 (L) 03/27/2024 1317   ACIDBASEDEF 11.0 (H) 03/27/2024 1317   O2SAT 99 03/27/2024 1317     Coagulation Profile: Recent Labs  Lab 03/27/24 0927  INR 1.2    Cardiac Enzymes: No results for input(s): "CKTOTAL", "CKMB", "CKMBINDEX", "TROPONINI" in the last 168 hours.  HbA1C: No results found for: "HGBA1C"  CBG: No results for input(s): "GLUCAP" in the last 168 hours.  Review of Systems:   Please see the history of present illness. All other systems reviewed and are negative    Past Medical History:  She,  has a past medical history of Ectopic pregnancy.   Surgical History:   Past Surgical History:  Procedure Laterality Date   NO PAST SURGERIES     TOOTH EXTRACTION       Social History:   reports that she has never smoked. She has never used smokeless tobacco. She reports that she does not drink alcohol and does not use drugs.   Family History:  Her family history includes Healthy in her father and mother.   Allergies No Known Allergies   Home Medications  Prior to Admission medications   Medication Sig Start Date End Date Taking? Authorizing Provider  aspirin  EC 81 MG tablet Take 1 tablet (81 mg total) by mouth daily. Swallow whole. 01/18/24  Yes Zelma Hidden, FNP  Prenatal Vit-Fe Fumarate-FA (MULTIVITAMIN-PRENATAL) 27-0.8 MG TABS tablet Take 1 tablet by mouth daily at 12 noon.   Yes [provider]  cholecalciferol (VITAMIN D3) 25 MCG (1000 UNIT) tablet Take 1,000 Units by mouth daily. Patient not taking: Reported on 12/18/2023    [provider]  doxylamine, Sleep, (UNISOM) 25 MG tablet Take 25 mg by mouth at bedtime as needed. Patient not taking: Reported on 12/18/2023    [provider]  ondansetron  (ZOFRAN ) 4 MG tablet Take 2 tablets (8 mg total) by mouth 2 (two) times daily. Patient not taking: Reported on 02/01/2024 12/18/23   Cherlynn Cornfield, NP     Critical care time:   40 minutes     CRITICAL CARE Performed by: Patt Boozer Ayeshia Coppin   Total critical care time: 40 minutes  Critical care time was exclusive of separately billable procedures and treating other patients.  Critical care was necessary to treat or prevent imminent or life-threatening deterioration.  Critical care was time spent personally by me on the following activities: development of treatment plan with patient and/or surrogate as well as nursing, discussions with consultants, evaluation of patient's response to treatment, examination of patient, obtaining history from patient or surrogate, ordering and performing treatments and interventions, ordering and review of laboratory studies, ordering and review of radiographic studies, pulse oximetry and re-evaluation of patient's condition.    Patt Boozer Varvara Legault, PA-C [place] [facility] [facility] See Amion for pager If no response to pager ,  please call [phone] until 7pm After 7:00 pm call Elink  [phone]

## 2024-03-27 NOTE — Transfer of Care (Signed)
 Immediate Anesthesia Transfer of Care Note  Patient: Toni Klein  Procedure(s) Performed: CESAREAN DELIVERY (Abdomen) EXAM UNDER ANESTHESIA, ABDOMINAL WITH CHOLECYSTOTOMY TUBE INSERTION (Abdomen)  Patient Location: PACU  Anesthesia Type:General  Level of Consciousness: awake, alert , and oriented  Airway & Oxygen Therapy: Patient Spontanous Breathing, nasal canula  Post-op Assessment: Report given to RN and Post -op Vital signs reviewed and stable  Post vital signs: Reviewed and stable  Last Vitals:  Vitals Value Taken Time  BP 122/84 03/27/24 1415  Temp    Pulse 116 03/27/24 1416  Resp 17 03/27/24 1416  SpO2 95 % 03/27/24 1416  Vitals shown include unfiled device data.  Last Pain:  Vitals:   03/27/24 1406  TempSrc:   PainSc: 10-Worst pain ever         Complications: No notable events documented.

## 2024-03-27 NOTE — Progress Notes (Signed)
 VIR note.   VIR called about the surgical cholecystostomy tube.    Evaluated at the bedside. Draining bile.  Some blood on the bandage, but no bleeding at the skin entry site.  The tube is mobile, and can be gently advanced inward, proving in the lumen of GB.   CT reviewed.  The drain is in the lumen.  The GB wall is thickened circumferentially.   No indication for procedure tonight.   We can speak with surgery tomorrow about possible replacement.   Signed,  Marciano Settles. Mabel Savage, DO, ABVM, RPVI

## 2024-03-27 NOTE — Consult Note (Signed)
 Chief Complaint: Patient was seen in consultation today for gangrenous cholecystitis  Referring Physician(s): Dr. Aniceto Barley  Supervising Physician: Creasie Doctor  Patient Status: Toni Klein - In-pt  History of Present Illness: Toni Klein is a 23 y.o. [phone] female with no significant past medical history who experienced acute onset right upper quadrant pain with nausea, vomiting, 10/10 headache.  On presentation to MAU she was immediately worked up for HELLP syndrome with acute cholecystitis with WBC 14.0, platelets 163, AST/ALT 2157/1214, Tbili 1.0, + proteinuria, BP 158/94.  US  Abdomen showed concern for gangrenous cholecystitis.  She underwent emergent cesarean under General anesthesia as well as intraoperative cholecystostomy by Dr. Aniceto Barley.  Drain was not functioning well post-operatively and IR consulted for drain evaluation.   Past Medical History:  Diagnosis Date   Ectopic pregnancy    Methotrexate     Past Surgical History:  Procedure Laterality Date   NO PAST SURGERIES     TOOTH EXTRACTION      Allergies: Patient has no known allergies.  Medications: Prior to Admission medications   Medication Sig Start Date End Date Taking? Authorizing Provider  aspirin  EC 81 MG tablet Take 1 tablet (81 mg total) by mouth daily. Swallow whole. 01/18/24  Yes Zelma Hidden, FNP  Prenatal Vit-Fe Fumarate-FA (MULTIVITAMIN-PRENATAL) 27-0.8 MG TABS tablet Take 1 tablet by mouth daily at 12 noon.   Yes [provider]  cholecalciferol (VITAMIN D3) 25 MCG (1000 UNIT) tablet Take 1,000 Units by mouth daily. Patient not taking: Reported on 12/18/2023    [provider]  doxylamine, Sleep, (UNISOM) 25 MG tablet Take 25 mg by mouth at bedtime as needed. Patient not taking: Reported on 12/18/2023    [provider]  ondansetron  (ZOFRAN ) 4 MG tablet Take 2 tablets (8 mg total) by mouth 2 (two) times daily. Patient not taking: Reported on 02/01/2024  12/18/23   Cherlynn Cornfield, NP     Family History  Problem Relation Age of Onset   Healthy Mother    Healthy Father     Social History   Socioeconomic History   Marital status: Single    Spouse name: Not on file   Number of children: Not on file   Years of education: Not on file   Highest education level: Not on file  Occupational History   Not on file  Tobacco Use   Smoking status: Never   Smokeless tobacco: Never  Vaping Use   Vaping status: Never Used  Substance and Sexual Activity   Alcohol use: Never   Drug use: Never   Sexual activity: Yes  Other Topics Concern   Not on file  Social History Narrative   ** Merged History Encounter **       Social Drivers of Health   Financial Resource Strain: Not on file  Food Insecurity: Not on file  Transportation Needs: Not on file  Physical Activity: Not on file  Stress: Not on file  Social Connections: Not on file     Review of Systems: A 12 point ROS discussed and pertinent positives are indicated in the HPI above.  All other systems are negative.  Review of Systems  Constitutional:  Negative for fatigue and fever.  Respiratory:  Negative for cough and shortness of breath.   Cardiovascular:  Negative for chest pain.  Gastrointestinal:  Negative for abdominal pain, nausea and vomiting.  Musculoskeletal:  Negative for back pain.  Psychiatric/Behavioral:  Negative for behavioral problems and confusion.  Vital Signs: BP 109/67 (BP Location: Right Arm)   Pulse (!) 121   Temp 98.2 F (36.8 C) (Oral)   Resp 14   Wt 132 lb 7.9 oz (60.1 kg)   LMP 07/15/2023   SpO2 96%   Breastfeeding Unknown   BMI 25.03 kg/m   Physical Exam Vitals and nursing note reviewed.  Constitutional:      General: She is not in acute distress.    Appearance: She is well-developed and normal weight. She is not ill-appearing.  Cardiovascular:     Rate and Rhythm: Normal rate and regular rhythm.  Pulmonary:     Effort: Pulmonary  effort is normal. No respiratory distress.     Breath sounds: Normal breath sounds.  Abdominal:     Comments: Post-partum.  LTCS incision. RLQ drain in place. Bloody drainage.   Neurological:     Mental Status: She is alert.      MD Evaluation Airway: WNL Heart: WNL Abdomen: WNL Chest/ Lungs: WNL ASA  Classification: 3 Mallampati/Airway Score: Two   Imaging: US  Abdomen Limited RUQ (LIVER/GB) Addendum Date: 03/27/2024 ADDENDUM REPORT: 03/27/2024 11:02 ADDENDUM: The original report was by Dr. Freida Jes. The following addendum is by Dr. Freida Jes: These results were called by telephone at the time of interpretation on 03/27/2024 at 10:52 AM to provider Princess Brooks , who verbally acknowledged these results. Electronically Signed   By: Freida Jes M.D.   On: 03/27/2024 11:02   Result Date: 03/27/2024 CLINICAL DATA:  Abdominal pain.  Third trimester pregnancy. EXAM: ULTRASOUND ABDOMEN LIMITED RIGHT UPPER QUADRANT COMPARISON:  None Available. FINDINGS: Gallbladder: Abnormal appearance of the gallbladder with asymmetric wall thickening irregular and thick membranous internal contents which are nonshadowing. Sonographic Murphy sign present. No gallstones observed. Common bile duct: Diameter: 0.2 cm Liver: No focal lesion identified. Within normal limits in parenchymal echogenicity. Portal vein is patent on color Doppler imaging with normal direction of blood flow towards the liver. Other: None. IMPRESSION: 1. Sonographic Abigail Abler sign present with asymmetric gallbladder wall thickening and membranous internal contents of the gallbladder. Appearance strongly favors acute cholecystitis and gangrenous cholecystitis is a distinct possibility. Urgent general surgical consultation recommended. Radiology assistant personnel have been notified to put me in telephone contact with the referring physician or the referring physician's clinical representative in order to discuss these findings.  Once this communication is established I will issue an addendum to this report for documentation purposes. Electronically Signed: By: Freida Jes M.D. On: 03/27/2024 10:39   US  MFM OB LIMITED Result Date: 03/27/2024 ----------------------------------------------------------------------  OBSTETRICS REPORT                    (Corrected Final 03/27/2024 10:26 am) ---------------------------------------------------------------------- Patient Info  ID #:       [id number]                          D.O.B.:  [birth year] (23 yrs)(F)  Name:       Toni Klein          Visit Date: 03/27/2024 09:29 am              MENDOZA ---------------------------------------------------------------------- Performed By  Attending:        Penney Bowling DO       Ref. Address:     [facility]  Performed By:     Carlene Che          Secondary Phy.:   [facility] MAU/Triage  RDMS  Referred By:      Mardee Shackle                 [facility] ---------------------------------------------------------------------- Orders  #  Description                           Code        Ordered By  1  US  MFM OB LIMITED                     76815.01    Princess Brooks ----------------------------------------------------------------------  #  Order #                     Accession #                Episode #  1  [id number]                   [id number]                 [id number] ---------------------------------------------------------------------- Indications  Abdominal pain in pregnancy                    O99.89  [redacted] weeks gestation of pregnancy                Z3A.36 ---------------------------------------------------------------------- Fetal Evaluation  Num Of Fetuses:         1  Fetal Heart Rate(bpm):  153  Cardiac Activity:       Observed  Presentation:           Cephalic  Placenta:               Posterior  P. Cord Insertion:      Previously seen  Amniotic Fluid  AFI  FV:      Within normal limits  AFI Sum(cm)     %Tile       Largest Pocket(cm)  12.9            45          4.7  RUQ(cm)       RLQ(cm)       LUQ(cm)        LLQ(cm)  4.7           3.4           3.3            1.5  Comment:    No placental abruption or previa identified. ---------------------------------------------------------------------- OB History  Gravidity:    6         Term:   1         SAB:   3  Ectopic:      1        Living:  1 ---------------------------------------------------------------------- Gestational Age  LMP:           36w 4d        Date:  07/15/23                  EDD:   04/20/24  Best:  36w 4d     Det. By:  LMP  (07/15/23)          EDD:   04/20/24 ---------------------------------------------------------------------- Anatomy  Diaphragm:             Appears normal         Kidneys:                Appear normal  Stomach:               Appears normal, left   Bladder:                Appears normal                         sided ---------------------------------------------------------------------- Cervix Uterus Adnexa  Cervix  Not visualized (advanced GA >24wks)  Uterus  No abnormality visualized.  Right Ovary  Within normal limits.  Left Ovary  Within normal limits.  Adnexa  No abnormality visualized ---------------------------------------------------------------------- Comments  Hospital Ultrasound  The patient presented to the MAU for abdominal pain that  was sudden onset and woke the patient from sleep. Liver  enzymes are 2,157 and 1,214 with normla bilirubin and plt  (163k). BP is slightly elevated but the patient is in extreme  pain per the [facility] provider.  Sonographic findings  Single intrauterine pregnancy at 36w 4d  Fetal cardiac activity: Observed and appears normal.  Presentation: Cephalic.  Limited fetal anatomy appears normal.  Amniotic fluid: Within normal limits.  MVP: 4.7 cm.  Placenta: Posterior. There is no sonographic evidence of  bleeding but the placenta appears very calcified.   Recommendations  - See MFM plan of care  This was a limited ultrasound with a remote read. If an official  MFM consult is requested for any reason please call/place an  order in Epic. ----------------------------------------------------------------------                      Penney Bowling, DO Electronically Signed Corrected Final Report  03/27/2024 10:26 am ----------------------------------------------------------------------   US  MFM FETAL BPP WO NON STRESS Result Date: 03/06/2024 ----------------------------------------------------------------------  OBSTETRICS REPORT                       (Signed Final 03/06/2024 05:39 pm) ---------------------------------------------------------------------- Patient Info  ID #:       [id number]                          D.O.B.:  10/16/2001 (23 yrs)(F)  Name:       Toni Klein          Visit Date: 03/06/2024 08:39 am              MENDOZA ---------------------------------------------------------------------- Performed By  Attending:        Cassandria Clever MD        Ref. Address:     [address]                                                             [place], [place]  [address]  Performed By:     Elspeth Hals       [facility]  Referred By:      Julianne Octave MD ---------------------------------------------------------------------- Orders  #  Description                           Code        Ordered By  1  US  MFM FETAL BPP WO NON               76819.01    RAVI SHANKAR     STRESS  2  US  MFM OB FOLLOW UP                   76816.01    RAVI SHANKAR ----------------------------------------------------------------------  #  Order #                     Accession #                 Episode #  1  [id number]                   [id number]                 [id number]  2  [id number]                   [id number]                 [id number] ---------------------------------------------------------------------- Indications  Antenatal screening for elevated               Z36.1  alphafetoprotein level (AFP) (3.82 MoM)  Medical complication of pregnancy (positive    O26.90  ANA)  Short interval between pregnancies, 3rd        O09.893  trimester  Encounter for other antenatal screening        Z36.2  follow-up  [redacted] weeks gestation of pregnancy                Z3A.33  Negative Horizon (2023), 2hr GTT WNL ---------------------------------------------------------------------- Vital Signs  BP:          120/66 ---------------------------------------------------------------------- Fetal Evaluation  Num Of Fetuses:  1  Fetal Heart Rate(bpm):  148  Cardiac Activity:       Observed  Presentation:           Cephalic  Placenta:               Posterior  P. Cord Insertion:      Previously seen  Amniotic Fluid  AFI FV:      Within normal limits  AFI Sum(cm)     %Tile       Largest Pocket(cm)  15.21           54          4.73  RUQ(cm)       RLQ(cm)       LUQ(cm)        LLQ(cm)  4.14          4.06          2.28           4.73 ---------------------------------------------------------------------- Biophysical Evaluation  Amniotic F.V:   Pocket => 2 cm             F. Tone:        Observed  F. Movement:    Observed                   Score:          8/8  F. Breathing:   Observed ---------------------------------------------------------------------- Biometry  BPD:      85.5  mm     G. Age:  34w 3d         72  %    CI:        80.25   %    70 - 86                                                          FL/HC:      19.0   %    19.4 - 21.8  HC:      301.5  mm     G. Age:  33w 3d         14  %    HC/AC:      1.04        0.96 - 1.11  AC:      289.9  mm     G. Age:  33w 0d         35  %    FL/BPD:     67.0   %    71 - 87   FL:       57.3  mm     G. Age:  30w 0d        < 1  %    FL/AC:      19.8   %    20 - 24  HUM:      52.1  mm     G. Age:  30w 3d        < 5  %   RIGHT  TIB:      51.3  mm     G. Age:  30w 4d        < 5  %  FIB:      52.5  mm     G. Age:  32w  2d         57  %   LEFT  TIB:      51.3  mm     G. Age:  30w 4d        < 5  %  FIB:      52.5  mm     G. Age:  32w 2d         57  %  Est. FW:    1944  gm      4 lb 5 oz     12  % ---------------------------------------------------------------------- OB History  Gravidity:    6         Term:   1         SAB:   3  Ectopic:      1        Living:  1 ---------------------------------------------------------------------- Gestational Age  LMP:           33w 4d        Date:  07/15/23                  EDD:   04/20/24  U/S Today:     32w 5d                                        EDD:   04/26/24  Best:          33w 4d     Det. By:  LMP  (07/15/23)          EDD:   04/20/24 ---------------------------------------------------------------------- Anatomy  Cranium:               Previously seen        Aortic Arch:            Previously seen  Cavum:                 Previously seen        Ductal Arch:            Previously seen  Ventricles:            Previously seen        Diaphragm:              Appears normal  Choroid Plexus:        Previously seen        Stomach:                Appears normal, left                                                                        sided  Cerebellum:            Previously seen        Abdomen:                Previously seen  Posterior Fossa:       Previously seen        Abdominal Wall:         Previously seen  Face:  Orbits and profile     Cord Vessels:           Previously seen                         previously seen  Lips:                  Previously seen        Kidneys:                Appear normal  Thoracic:              Previously seen        Bladder:                Appears normal  Heart:                 Appears normal         Spine:                   Previously seen                         (4CH, axis, and                         situs)  RVOT:                  Previously seen        Upper Extremities:      Previously seen  LVOT:                  Previously seen        Lower Extremities:      Previously seen  Other:  Fetal anatomic survey complete on prior scans. Female gender          previously seen. ---------------------------------------------------------------------- Impression  Increased MSAFP on mid-trimester screening.  Fetal growth is appropriate for gestational age .Amniotic fluid  is normal and good fetal activity is seen .Antenatal testing is  reassuring. BPP 8/8. ---------------------------------------------------------------------- Recommendations  -Continue weekly BPP till delivery. ----------------------------------------------------------------------                 Cassandria Clever, MD Electronically Signed Final Report   03/06/2024 05:39 pm ----------------------------------------------------------------------   US  MFM OB FOLLOW UP Result Date: 03/06/2024 ----------------------------------------------------------------------  OBSTETRICS REPORT                       (Signed Final 03/06/2024 05:39 pm) ---------------------------------------------------------------------- Patient Info  ID #:       [id number]                          D.O.B.:  [birth year] (23 yrs)(F)  Name:       Toni Klein          Visit Date: 03/06/2024 08:39 am              MENDOZA ---------------------------------------------------------------------- Performed By  Attending:        Cassandria Clever MD        Ref. Address:     [address]  [place], [place]                                                             [address]  Performed By:     Elspeth Hals       Location[facility]                                     [facility]  Referred By:      Julianne Octave MD ---------------------------------------------------------------------- Orders  #  Description                           Code        Ordered By  1  US  MFM FETAL BPP WO NON               76819.01    RAVI SHANKAR     STRESS  2  US  MFM OB FOLLOW UP                   76816.01    RAVI [facility] ----------------------------------------------------------------------  #  Order #                     Accession #                Episode #  1  [id number]                   [id number]                 [id number]  2  [id number]                   [id number]                 [id number] ---------------------------------------------------------------------- Indications  Antenatal screening for elevated               Z36.1  alphafetoprotein level (AFP) (3.82 MoM)  Medical complication of pregnancy (positive    O26.90  ANA)  Short interval between pregnancies, 3rd        O09.893  trimester  Encounter for other antenatal screening  Z36.2  follow-up  [redacted] weeks gestation of pregnancy                Z3A.33  Negative Horizon (2023), 2hr GTT WNL ---------------------------------------------------------------------- Vital Signs  BP:          120/66 ---------------------------------------------------------------------- Fetal Evaluation  Num Of Fetuses:         1  Fetal Heart Rate(bpm):  148  Cardiac Activity:       Observed  Presentation:           Cephalic  Placenta:               Posterior  P. Cord Insertion:      Previously seen  Amniotic Fluid  AFI FV:      Within normal limits  AFI Sum(cm)     %Tile       Largest Pocket(cm)  15.21           54          4.73  RUQ(cm)       RLQ(cm)       LUQ(cm)        LLQ(cm)  4.14          4.06          2.28           4.73 ---------------------------------------------------------------------- Biophysical Evaluation  Amniotic F.V:   Pocket => 2 cm             F.  Tone:        Observed  F. Movement:    Observed                   Score:          8/8  F. Breathing:   Observed ---------------------------------------------------------------------- Biometry  BPD:      85.5  mm     G. Age:  34w 3d         72  %    CI:        80.25   %    70 - 86                                                          FL/HC:      19.0   %    19.4 - 21.8  HC:      301.5  mm     G. Age:  33w 3d         14  %    HC/AC:      1.04        0.96 - 1.11  AC:      289.9  mm     G. Age:  33w 0d         35  %    FL/BPD:     67.0   %    71 - 87  FL:       57.3  mm     G. Age:  30w 0d        < 1  %    FL/AC:      19.8   %    20 - 24  HUM:      52.1  mm     G. Age:  30w 3d        <  5  %   RIGHT  TIB:      51.3  mm     G. Age:  30w 4d        < 5  %  FIB:      52.5  mm     G. Age:  32w 2d         57  %   LEFT  TIB:      51.3  mm     G. Age:  30w 4d        < 5  %  FIB:      52.5  mm     G. Age:  32w 2d         57  %  Est. FW:    1944  gm      4 lb 5 oz     12  % ---------------------------------------------------------------------- OB History  Gravidity:    6         Term:   1         SAB:   3  Ectopic:      1        Living:  1 ---------------------------------------------------------------------- Gestational Age  LMP:           33w 4d        Date:  07/15/23                  EDD:   04/20/24  U/S Today:     32w 5d                                        EDD:   04/26/24  Best:          33w 4d     Det. By:  LMP  (07/15/23)          EDD:   04/20/24 ---------------------------------------------------------------------- Anatomy  Cranium:               Previously seen        Aortic Arch:            Previously seen  Cavum:                 Previously seen        Ductal Arch:            Previously seen  Ventricles:            Previously seen        Diaphragm:              Appears normal  Choroid Plexus:        Previously seen        Stomach:                Appears normal, left                                                                         sided  Cerebellum:            Previously seen        Abdomen:  Previously seen  Posterior Fossa:       Previously seen        Abdominal Wall:         Previously seen  Face:                  Orbits and profile     Cord Vessels:           Previously seen                         previously seen  Lips:                  Previously seen        Kidneys:                Appear normal  Thoracic:              Previously seen        Bladder:                Appears normal  Heart:                 Appears normal         Spine:                  Previously seen                         (4CH, axis, and                         situs)  RVOT:                  Previously seen        Upper Extremities:      Previously seen  LVOT:                  Previously seen        Lower Extremities:      Previously seen  Other:  Fetal anatomic survey complete on prior scans. Female gender          previously seen. ---------------------------------------------------------------------- Impression  Increased MSAFP on mid-trimester screening.  Fetal growth is appropriate for gestational age .Amniotic fluid  is normal and good fetal activity is seen .Antenatal testing is  reassuring. BPP 8/8. ---------------------------------------------------------------------- Recommendations  -Continue weekly BPP till delivery. ----------------------------------------------------------------------                 Cassandria Clever, MD Electronically Signed Final Report   03/06/2024 05:39 pm ----------------------------------------------------------------------    Labs:  CBC: Recent Labs    10/22/23 0000 01/18/24 1610 03/27/24 0847 03/27/24 1317 03/27/24 1548 03/27/24 1552  WBC  --  10.5 14.0*  --  12.6*  --   HGB 13.1 12.5 14.2 8.5* 9.2* 7.5*  HCT 38 37.4 40.9 25.0* 26.8* 22.0*  PLT 317 352 163  --  59*  61*  --     COAGS: Recent Labs    03/27/24 0927 03/27/24 1548  INR 1.2 1.8*  APTT 31 36    BMP: Recent Labs     01/18/24 1105 03/27/24 0847 03/27/24 1317 03/27/24 1552  NA 140 135 134* 134*  K 3.6 3.8 4.2 4.5  CL 105 107  --   --   CO2 21 17*  --   --   GLUCOSE 87 120*  --   --  BUN 4* 10  --   --   CALCIUM 8.8 8.4*  --   --   CREATININE 0.36* 0.60  --   --   GFRNONAA  --  >60  --   --     LIVER FUNCTION TESTS: Recent Labs    01/18/24 1105 03/27/24 0847  BILITOT <0.2 1.0  AST 15 2,157*  ALT 10 1,214*  ALKPHOS 177* 230*  PROT 6.3 6.5  ALBUMIN 3.5* 3.0*    TUMOR MARKERS: No results for input(s): "AFPTM", "CEA", "CA199", "CHROMGRNA" in the last 8760 hours.  Assessment and Plan: Severe transaminitis, acidemia, proteinuria, and probable cholecystitis in late pregnancy concerning for HELLP with acute cholecystitis.  Patient s/p cesarean delivery under general anesthesia with cholecystostomy drainage by general surgery.  Post-op recovery in medical ICU overnight.  Repeat labwork this afternoon shows: Elevated lactic acid Hgb down to 9.2 Platelets 59 WBC 12.6  Plan to obtain CT Abdomen Pelvis w contrast to evaluate current drain placement.   IR available for drain manipulation as indicated based on imaging and clinical course.   All was discussed with patient at bedside.  She is aware of plans for CT imaging prior to next steps.    Thank you for this interesting consult.  I greatly enjoyed meeting Toni Klein and look forward to participating in their care.  A copy of this report was sent to the requesting provider on this date.  Electronically Signed: Makalia Bare Sue-Ellen Ethleen Lormand, PA 03/27/2024, 4:45 PM   I spent a total of 40 Minutes    in face to face in clinical consultation, greater than 50% of which was counseling/coordinating care for acute cholecystitis.

## 2024-03-27 NOTE — Progress Notes (Signed)
 03/27/2024 DIC panel reviewed: 2 FFP, 1 cryo, recheck 20:00

## 2024-03-27 NOTE — MAU Provider Note (Signed)
 History     CSN: [id number]  Arrival date and time: 03/27/24 4098   Event Date/Time   First Provider Initiated Contact with Patient 03/27/24 0827      Chief Complaint  Patient presents with   Abdominal Pain   Headache   HPI Patient is a 23 year old G6 P1-0-4-1 at 36 weeks 4 days presenting for acute severe abdominal pain.  Reports that 3 AM she started having severe upper abdominal pain that started in the right epigastric area and is now radiating to the left side.  Denies any pressure or contractions.  Is also having nausea and vomiting.  Reports a burning pain as well.  Because of the pain she has not been feeling movements.  Headache started while en route to the hospital.  Previous uncomplicated term vaginal delivery.  OB History     Gravida  6   Para  1   Term  1   Preterm  0   AB  4   Living  1      SAB  3   IAB  0   Ectopic  1   Multiple      Live Births  1           Past Medical History:  Diagnosis Date   Ectopic pregnancy    Methotrexate     Past Surgical History:  Procedure Laterality Date   NO PAST SURGERIES     TOOTH EXTRACTION      Family History  Problem Relation Age of Onset   Healthy Mother    Healthy Father     Social History   Tobacco Use   Smoking status: Never   Smokeless tobacco: Never  Vaping Use   Vaping status: Never Used  Substance Use Topics   Alcohol use: Never   Drug use: Never    Allergies: No Known Allergies  Medications Prior to Admission  Medication Sig Dispense Refill Last Dose/Taking   aspirin  EC 81 MG tablet Take 1 tablet (81 mg total) by mouth daily. Swallow whole. 30 tablet 5 03/26/2024   Prenatal Vit-Fe Fumarate-FA (MULTIVITAMIN-PRENATAL) 27-0.8 MG TABS tablet Take 1 tablet by mouth daily at 12 noon.   03/26/2024   cholecalciferol (VITAMIN D3) 25 MCG (1000 UNIT) tablet Take 1,000 Units by mouth daily. (Patient not taking: Reported on 12/18/2023)      doxylamine, Sleep, (UNISOM) 25 MG tablet Take 25  mg by mouth at bedtime as needed. (Patient not taking: Reported on 12/18/2023)      ondansetron  (ZOFRAN ) 4 MG tablet Take 2 tablets (8 mg total) by mouth 2 (two) times daily. (Patient not taking: Reported on 02/01/2024) 20 tablet 0     Review of Systems  Gastrointestinal:  Positive for abdominal pain, nausea and vomiting.  Genitourinary:  Negative for vaginal bleeding, vaginal discharge and vaginal pain.  Neurological:  Positive for headaches.   Physical Exam   Blood pressure (!) 158/94, pulse 69, temperature 98.4 F (36.9 C), temperature source Oral, resp. rate 20, weight 58 kg, last menstrual period 07/15/2023, SpO2 100%, unknown if currently breastfeeding.  Physical Exam Vitals and nursing note reviewed. Exam conducted with a chaperone present.  Constitutional:      General: She is in acute distress.     Appearance: She is ill-appearing.  HENT:     Head: Normocephalic.     Mouth/Throat:     Mouth: Mucous membranes are moist.  Cardiovascular:     Rate and Rhythm: Tachycardia present.  Pulmonary:  Effort: Pulmonary effort is normal.  Abdominal:     General: Bowel sounds are normal.     Palpations: Abdomen is rigid.     Tenderness: There is abdominal tenderness in the right upper quadrant, epigastric area and left upper quadrant.     Comments: gravid  Genitourinary:    Comments: 1.5/70/-2 Skin:    General: Skin is warm.  Neurological:     General: No focal deficit present.     Mental Status: She is alert.     MAU Course  Procedures  MDM CBC CMP PC ratio Amylase Lipase PT/INR APTT Fibrinogen -type and screen Hepatic panel Tylenol  level Pneumonia General Surgery consult GI consult CCM consult  Assessment and Plan  Citlally Luane Rumps Shirell Pelz is a 23 yo [phone] @ [redacted]w[redacted]d presenting for severe epigastric abdominal pain.  Abdominal pain Gangrenous cholecystitis Severe preeclampsia/HELLP? Patient woke up with severe abdominal pain and called EMS.  On  arrival in excruciating pain.  Given fentanyl  IV started and lab work drawn.  Category 1 strip.  Blood pressures were elevated. - RUQ showing sonographic Murphy sign with asymmetric gallbladder wall thickening with concern for acute cholecystitis and gangrenous cholecystitis -CMP showing AST/ALT 2157/1214 respectively -Creatinine within normal limits -Glucose 120 - PT elevated 15.8 - PC ratio 6.75 -General Surgery consulted appreciate their assistance, given the concern for gangrenous cholecystitis recommend cesarean delivery and surgical management - Maternal-fetal medicine in agreement - GI has been consulted and will follow as well - Manage were called and patient will go for primary cesarean delivery followed by laparoscopic versus open cholecystectomy  Ferdie Housekeeper 03/27/2024, 9:27 AM

## 2024-03-27 NOTE — Consult Note (Addendum)
 Consultation  Referring Provider:     Inpatient OB service Primary Care Physician:  Pcp, No Primary Gastroenterologist:     Para Bold    Reason for Consultation:     Elevated liver enzymes         HPI:   Toni Klein is a 23 y.o. female with no prior known GI history, currently 36 weeks 4 days presents with acute upper abdominal pain.  Patient was already in the operating room when I came by her room to see her.  Chart reviewed and patient had presented with acute onset upper abdominal/epigastric pain starting at 3 AM, then developed headache en route to the hospital.  Admission evaluation notable for the following: - AST/ALT 2157/1214, T. bili 1.0, ALP 230 - WBC 14 - INR 1.2 - Normal amylase/lipase - RUQ US : Thickened gallbladder with positive sonographic Murphy sign, no gallstones.  CBD 0.2 cm with otherwise normal-appearing liver.  Portal vein patent.  Comparison labs from 01/10/2024: - AST/ALT 15/10, ALP 177 - WBC 10.5  Past Medical History:  Diagnosis Date   Ectopic pregnancy    Methotrexate     Past Surgical History:  Procedure Laterality Date   NO PAST SURGERIES     TOOTH EXTRACTION      Family History  Problem Relation Age of Onset   Healthy Mother    Healthy Father      Social History   Tobacco Use   Smoking status: Never   Smokeless tobacco: Never  Vaping Use   Vaping status: Never Used  Substance Use Topics   Alcohol use: Never   Drug use: Never    Prior to Admission medications   Medication Sig Start Date End Date Taking? Authorizing Provider  aspirin  EC 81 MG tablet Take 1 tablet (81 mg total) by mouth daily. Swallow whole. 01/18/24  Yes Zelma Hidden, FNP  Prenatal Vit-Fe Fumarate-FA (MULTIVITAMIN-PRENATAL) 27-0.8 MG TABS tablet Take 1 tablet by mouth daily at 12 noon.   Yes [provider]  cholecalciferol (VITAMIN D3) 25 MCG (1000 UNIT) tablet Take 1,000 Units by mouth daily. Patient not taking: Reported on  12/18/2023    [provider]  doxylamine, Sleep, (UNISOM) 25 MG tablet Take 25 mg by mouth at bedtime as needed. Patient not taking: Reported on 12/18/2023    [provider]  ondansetron  (ZOFRAN ) 4 MG tablet Take 2 tablets (8 mg total) by mouth 2 (two) times daily. Patient not taking: Reported on 02/01/2024 12/18/23   Cherlynn Cornfield, NP    Current Facility-Administered Medications  Medication Dose Route Frequency Provider Last Rate Last Admin   [MAR Hold] labetalol (NORMODYNE) injection 20 mg  20 mg Intravenous PRN Cresenzo, John V, MD       And   Evette Hoes Hold] labetalol (NORMODYNE) injection 40 mg  40 mg Intravenous PRN Cresenzo, John V, MD       And   Evette Hoes Hold] labetalol (NORMODYNE) injection 80 mg  80 mg Intravenous PRN Cresenzo, John V, MD       And   Evette Hoes Hold] hydrALAZINE (APRESOLINE) injection 10 mg  10 mg Intravenous PRN Cresenzo, John V, MD       lactated ringers  infusion   Intravenous Continuous Cresenzo, John V, MD 125 mL/hr at 03/27/24 0840 New Bag at 03/27/24 1255   lactated ringers  infusion   Intravenous Continuous Cresenzo, John V, MD       magnesium sulfate 40 grams in SWI 1000 mL OB infusion  2 g/hr Intravenous Continuous Cresenzo, John V, MD 50 mL/hr at 03/27/24 1250 2 g/hr at 03/27/24 1250   Facility-Administered Medications Ordered in Other Encounters  Medication Dose Route Frequency Provider Last Rate Last Admin   0.9 %  sodium chloride  infusion   Intravenous Continuous PRN Alphia Jasmine, CRNA   Stopped at 03/27/24 1317   ceFAZolin (ANCEF) IVPB 2 g/50 mL premix   Intravenous Anesthesia Intra-op Alphia Jasmine, CRNA   2 g at 03/27/24 1230   dexamethasone (DECADRON) injection   Intravenous Anesthesia Intra-op Mercer, Kari, CRNA   10 mg at 03/27/24 1246   dexmedetomidine (PRECEDEX) 80 MCG/20ML   Intravenous Anesthesia Intra-op Alphia Jasmine, CRNA   12 mcg at 03/27/24 1247   fentaNYL  citrate (PF) (SUBLIMAZE ) injection   Intravenous Anesthesia Intra-op Alphia Jasmine, CRNA   100 mcg at 03/27/24 1245   lidocaine  2% (20 mg/mL) 5 mL syringe   Intravenous Anesthesia Intra-op Alphia Jasmine, CRNA   100 mg at 03/27/24 1237   ondansetron  (ZOFRAN ) injection   Intravenous Anesthesia Intra-op Alphia Jasmine, CRNA   4 mg at 03/27/24 1246   oxytocin  (PITOCIN ) IV infusion 30 units in NS 500 mL - Premix   Intravenous Anesthesia Intra-op Alphia Jasmine, CRNA   300 mL at 03/27/24 1245   propofol (DIPRIVAN) 10 mg/mL bolus/IV push   Intravenous Anesthesia Intra-op Alphia Jasmine, CRNA   120 mg at 03/27/24 1241   rocuronium (ZEMURON) injection   Intravenous Anesthesia Intra-op Alphia Jasmine, CRNA   10 mg at 03/27/24 1247   succinylcholine (ANECTINE) syringe   Intravenous Anesthesia Intra-op Alphia Jasmine, CRNA   70 mg at 03/27/24 1241   tranexamic acid  (CYKLOKAPRON ) IVPB   Intravenous Anesthesia Intra-op Alphia Jasmine, CRNA   1,000 mg at 03/27/24 1236    Allergies as of 03/27/2024   (No Known Allergies)     Review of Systems:    As per HPI, otherwise negative    Physical Exam:  Vital signs in last 24 hours: Temp:  [98.4 F (36.9 C)] 98.4 F (36.9 C) (05/08 0815) Pulse Rate:  [69-127] 120 (05/08 1211) Resp:  [20] 20 (05/08 0815) BP: (127-158)/(77-99) 127/77 (05/08 1211) SpO2:  [94 %-100 %] 95 % (05/08 1200) Weight:  [58 kg] 58 kg (05/08 0815)    LAB RESULTS: Recent Labs    03/27/24 0847 03/27/24 1317  WBC 14.0*  --   HGB 14.2 8.5*  HCT 40.9 25.0*  PLT 163  --    BMET Recent Labs    03/27/24 0847 03/27/24 1317  NA 135 134*  K 3.8 4.2  CL 107  --   CO2 17*  --   GLUCOSE 120*  --   BUN 10  --   CREATININE 0.60  --   CALCIUM 8.4*  --    LFT Recent Labs    03/27/24 0847  PROT 6.5  ALBUMIN 3.0*  AST 2,157*  ALT 1,214*  ALKPHOS 230*  BILITOT 1.0   PT/INR Recent Labs    03/27/24 0927  LABPROT 15.8*  INR 1.2    STUDIES: US  Abdomen Limited RUQ (LIVER/GB) Addendum Date: 03/27/2024 ADDENDUM REPORT: 03/27/2024 11:02 ADDENDUM: The original  report was by Dr. Freida Jes. The following addendum is by Dr. Freida Jes: These results were called by telephone at the time of interpretation on 03/27/2024 at 10:52 AM to provider Princess Brooks , who verbally acknowledged these results. Electronically Signed   By: Freida Jes M.D.   On: 03/27/2024 11:02   Result  Date: 03/27/2024 CLINICAL DATA:  Abdominal pain.  Third trimester pregnancy. EXAM: ULTRASOUND ABDOMEN LIMITED RIGHT UPPER QUADRANT COMPARISON:  None Available. FINDINGS: Gallbladder: Abnormal appearance of the gallbladder with asymmetric wall thickening irregular and thick membranous internal contents which are nonshadowing. Sonographic Murphy sign present. No gallstones observed. Common bile duct: Diameter: 0.2 cm Liver: No focal lesion identified. Within normal limits in parenchymal echogenicity. Portal vein is patent on color Doppler imaging with normal direction of blood flow towards the liver. Other: None. IMPRESSION: 1. Sonographic Abigail Abler sign present with asymmetric gallbladder wall thickening and membranous internal contents of the gallbladder. Appearance strongly favors acute cholecystitis and gangrenous cholecystitis is a distinct possibility. Urgent general surgical consultation recommended. Radiology assistant personnel have been notified to put me in telephone contact with the referring physician or the referring physician's clinical representative in order to discuss these findings. Once this communication is established I will issue an addendum to this report for documentation purposes. Electronically Signed: By: Freida Jes M.D. On: 03/27/2024 10:39   US  MFM OB LIMITED Result Date: 03/27/2024 ----------------------------------------------------------------------  OBSTETRICS REPORT                    (Corrected Final 03/27/2024 10:26 am) ---------------------------------------------------------------------- Patient Info  ID #:       [id number]                           D.O.B.:  February 27, 2001 (23 yrs)(F)  Name:       Toni Klein          Visit Date: 03/27/2024 09:29 am              MENDOZA ---------------------------------------------------------------------- Performed By  Attending:        Penney Bowling DO       Ref. Address:     [facility]  Performed By:     Carlene Che          Secondary Phy.:   [facility] MAU/Triage                    RDMS  Referred By:      Mardee Shackle                 [facility] ---------------------------------------------------------------------- Orders  #  Description                           Code        Ordered By  1  US  MFM OB LIMITED                     [id number]    Princess Brooks ----------------------------------------------------------------------  #  Order #                     Accession #                Episode #  1  [id number]                   [id number]  [id number] ---------------------------------------------------------------------- Indications  Abdominal pain in pregnancy                    O99.89  [redacted] weeks gestation of pregnancy                Z3A.36 ---------------------------------------------------------------------- Fetal Evaluation  Num Of Fetuses:         1  Fetal Heart Rate(bpm):  153  Cardiac Activity:       Observed  Presentation:           Cephalic  Placenta:               Posterior  P. Cord Insertion:      Previously seen  Amniotic Fluid  AFI FV:      Within normal limits  AFI Sum(cm)     %Tile       Largest Pocket(cm)  12.9            45          4.7  RUQ(cm)       RLQ(cm)       LUQ(cm)        LLQ(cm)  4.7           3.4           3.3            1.5  Comment:    No placental abruption or previa identified. ---------------------------------------------------------------------- OB History  Gravidity:    6         Term:   1         SAB:   3  Ectopic:      1        Living:  1  ---------------------------------------------------------------------- Gestational Age  LMP:           36w 4d        Date:  07/15/23                  EDD:   04/20/24  Best:          36w 4d     Det. By:  LMP  (07/15/23)          EDD:   04/20/24 ---------------------------------------------------------------------- Anatomy  Diaphragm:             Appears normal         Kidneys:                Appear normal  Stomach:               Appears normal, left   Bladder:                Appears normal                         sided ---------------------------------------------------------------------- Cervix Uterus Adnexa  Cervix  Not visualized (advanced GA >24wks)  Uterus  No abnormality visualized.  Right Ovary  Within normal limits.  Left Ovary  Within normal limits.  Adnexa  No abnormality visualized ---------------------------------------------------------------------- Comments  Hospital Ultrasound  The patient presented to the MAU for abdominal pain that  was sudden onset and woke the patient from sleep. Liver  enzymes are 2,157 and 1,214 with normla bilirubin and plt  (163k). BP is slightly elevated but the patient is in extreme  pain per the [facility] provider.  Sonographic findings  Single intrauterine pregnancy at 36w 4d  Fetal cardiac activity:  Observed and appears normal.  Presentation: Cephalic.  Limited fetal anatomy appears normal.  Amniotic fluid: Within normal limits.  MVP: 4.7 cm.  Placenta: Posterior. There is no sonographic evidence of  bleeding but the placenta appears very calcified.  Recommendations  - See MFM plan of care  This was a limited ultrasound with a remote read. If an official  MFM consult is requested for any reason please call/place an  order in Epic. ----------------------------------------------------------------------                      Penney Bowling, DO Electronically Signed Corrected Final Report  03/27/2024 10:26 am ----------------------------------------------------------------------      PREVIOUS ENDOSCOPIES:            None   Impression / Plan:   1) Elevated liver enzymes 2) Upper abdominal pain 3) Cholecystitis on imaging Patient already in OR for immediate delivery and joint cholecystectomy.  Will plan to see her afterwards.  In the meantime, placed orders to evaluate significantly elevated liver enzymes. - Viral hepatitis panel and ammonia level pending - Check APAP, salicylates, ceruloplasmin, EBV, CMV, hepatitis C, autoimmune panel - Ultrasound with patent portal vein.  If enzymes uptrending, may repeat ultrasound with Doppler to assess hepatic vein and rule out Budd-Chiari - Repeat liver enzymes postop with short interval trend initially  Harry Lindau, DO, [facility]    LOS: 0 days   Annis Kinder  03/27/2024, 1:31 PM  Addendum: Evaluated patient in the ICU after OR.  Spanish interpreter at bedside.  Patient confirms no previous known history of hepatobiliary disease and no known family history of pancreatic or hepatobiliary disease.  Pain was acute in onset.  Plan for extended evaluation as above.  Continue CT for further evaluation of hepatic pain as well when stable.  Inpatient GI service will continue to follow.

## 2024-03-27 NOTE — Lactation Note (Signed)
 This note was copied from a baby's chart.  NICU Lactation Consultation Note  Patient Name: Boy Citlally Jazya Cahall Today's Date: 03/27/2024 Age:22 hours  Reason for consult: Initial assessment; NICU baby; Infant < 5lbs; Other (Comment); Late-preterm 34-36.6wks (severe Pre-E, HELLP, maternal ICU admission)  SUBJECTIVE Visited with family of 69 28/75 weeks old NICU female; baby "Gena Kemp" got admitted due to respiratory distress, he's currently undergoing therapeutic cooling. Ms. Dwane Gitelman got admitted to the medical ICU on 21M, she was getting ready to have a transfusion when this LC came in the room. Briefly introduced myself and asked what her preference was for her feeding plan, she voiced she would like to do both, direct breastfeeding along with pumping and bottle feeding, she mostly pumped and bottle fed her first baby with breastmilk/formula, her baby girl is now 2 y.o. She is not ready to start pumping right now, but is amenable to see lactation again tomorrow; let ICU RN to get a breast pump from the Peds unit, in order for this LC to bring the rest of the pumping supplies to set it up tomorrow. Let [name] rest for the day, she was exhausted and accompanied FOB to the NICU to see baby "Gena Kemp" per their request.   OBJECTIVE Infant data: Mother's Current Feeding Choice: -- (NPO)  O2 Device: CPAP FiO2 (%): 21 %  Infant feeding assessment No data recorded  Maternal data: [id number] C-Section, Low Transverse Has patient been taught Hand Expression?: No Hand Expression Comments: deferred at this time Significant Breast History:: (+) breast changes during the pregnancy Current breast feeding challenges:: NICU admission Does the patient have breastfeeding experience prior to this delivery?: Yes How long did the patient breastfeed?: unsure, mostly pumping and bottle feeding, ocassionally going to breast Risk factor for low/delayed milk supply:: C/S, prematurity, infant  separation, < 5 lbs, maternal ICU admission  WIC Program: Yes WIC Referral Sent?: Yes What county?: [place]  ASSESSMENT Infant: Feeding Status: NPO  Maternal: Unable to initiated pumping at this time due to critical maternal state  INTERVENTIONS/PLAN Interventions: Interventions: Breast feeding basics reviewed  Plan: Consult Status: NICU follow-up NICU Follow-up type: New admission follow up   [employer] 03/27/2024, 6:51 PM

## 2024-03-28 ENCOUNTER — Ambulatory Visit: Payer: Self-pay

## 2024-03-28 ENCOUNTER — Other Ambulatory Visit: Payer: Self-pay

## 2024-03-28 ENCOUNTER — Inpatient Hospital Stay (HOSPITAL_COMMUNITY): Payer: MEDICAID

## 2024-03-28 ENCOUNTER — Encounter: Payer: Self-pay | Admitting: Physician Assistant

## 2024-03-28 ENCOUNTER — Encounter (HOSPITAL_COMMUNITY): Payer: Self-pay | Admitting: Obstetrics & Gynecology

## 2024-03-28 DIAGNOSIS — R17 Unspecified jaundice: Secondary | ICD-10-CM

## 2024-03-28 DIAGNOSIS — M7981 Nontraumatic hematoma of soft tissue: Secondary | ICD-10-CM

## 2024-03-28 DIAGNOSIS — O1425 HELLP syndrome, complicating the puerperium: Secondary | ICD-10-CM

## 2024-03-28 DIAGNOSIS — O1423 HELLP syndrome (HELLP), third trimester: Secondary | ICD-10-CM

## 2024-03-28 DIAGNOSIS — D689 Coagulation defect, unspecified: Secondary | ICD-10-CM

## 2024-03-28 LAB — DIC (DISSEMINATED INTRAVASCULAR COAGULATION)PANEL
D-Dimer, Quant: 8.61 ug{FEU}/mL — ABNORMAL HIGH (ref 0.00–0.50)
D-Dimer, Quant: 7.5 ug{FEU}/mL — ABNORMAL HIGH (ref 0.00–0.50)
Fibrinogen: 168 mg/dL — ABNORMAL LOW (ref 210–475)
Fibrinogen: 233 mg/dL (ref 210–475)
INR: 1.3 — ABNORMAL HIGH (ref 0.8–1.2)
INR: 1.2 (ref 0.8–1.2)
Platelets: 56 10*3/uL — ABNORMAL LOW (ref 150–400)
Platelets: 76 10*3/uL — ABNORMAL LOW (ref 150–400)
Prothrombin Time: 16.6 s — ABNORMAL HIGH (ref 11.4–15.2)
Prothrombin Time: 15.7 s — ABNORMAL HIGH (ref 11.4–15.2)
Smear Review: NONE SEEN
Smear Review: NONE SEEN
aPTT: 32 s (ref 24–36)
aPTT: 32 s (ref 24–36)

## 2024-03-28 LAB — COMPREHENSIVE METABOLIC PANEL WITH GFR
ALT: 384 U/L — ABNORMAL HIGH (ref 0–44)
ALT: 276 U/L — ABNORMAL HIGH (ref 0–44)
AST: 662 U/L — ABNORMAL HIGH (ref 15–41)
AST: 341 U/L — ABNORMAL HIGH (ref 15–41)
Albumin: 2.9 g/dL — ABNORMAL LOW (ref 3.5–5.0)
Albumin: 3.6 g/dL (ref 3.5–5.0)
Alkaline Phosphatase: 82 U/L (ref 38–126)
Alkaline Phosphatase: 84 U/L (ref 38–126)
Anion gap: 10 (ref 5–15)
Anion gap: 11 (ref 5–15)
BUN: 8 mg/dL (ref 6–20)
BUN: 7 mg/dL (ref 6–20)
CO2: 26 mmol/L (ref 22–32)
CO2: 27 mmol/L (ref 22–32)
Calcium: 6.6 mg/dL — ABNORMAL LOW (ref 8.9–10.3)
Calcium: 6.9 mg/dL — ABNORMAL LOW (ref 8.9–10.3)
Chloride: 96 mmol/L — ABNORMAL LOW (ref 98–111)
Chloride: 99 mmol/L (ref 98–111)
Creatinine, Ser: 0.64 mg/dL (ref 0.44–1.00)
Creatinine, Ser: 0.66 mg/dL (ref 0.44–1.00)
GFR, Estimated: >60 mL/min (ref >60)
GFR, Estimated: >60 mL/min (ref >60)
Glucose, Bld: 107 mg/dL — ABNORMAL HIGH (ref 70–99)
Glucose, Bld: 84 mg/dL (ref 70–99)
Potassium: 3.7 mmol/L (ref 3.5–5.1)
Potassium: 3.9 mmol/L (ref 3.5–5.1)
Sodium: 132 mmol/L — ABNORMAL LOW (ref 135–145)
Sodium: 137 mmol/L (ref 135–145)
Total Bilirubin: 6.7 mg/dL — ABNORMAL HIGH (ref 0.0–1.2)
Total Bilirubin: 3.4 mg/dL — ABNORMAL HIGH (ref 0.0–1.2)
Total Protein: 4.9 g/dL — ABNORMAL LOW (ref 6.5–8.1)
Total Protein: 5.6 g/dL — ABNORMAL LOW (ref 6.5–8.1)

## 2024-03-28 LAB — URINE CULTURE: Culture: NO GROWTH

## 2024-03-28 LAB — CBC
HCT: 11.9 % — ABNORMAL LOW (ref 36.0–46.0)
HCT: 27.2 % — ABNORMAL LOW (ref 36.0–46.0)
HCT: 26.9 % — ABNORMAL LOW (ref 36.0–46.0)
Hemoglobin: 4.2 g/dL — CL (ref 12.0–15.0)
Hemoglobin: 10 g/dL — ABNORMAL LOW (ref 12.0–15.0)
Hemoglobin: 9.7 g/dL — ABNORMAL LOW (ref 12.0–15.0)
MCH: 31.1 pg (ref 26.0–34.0)
MCH: 31.2 pg (ref 26.0–34.0)
MCH: 30.9 pg (ref 26.0–34.0)
MCHC: 35.3 g/dL (ref 30.0–36.0)
MCHC: 36.8 g/dL — ABNORMAL HIGH (ref 30.0–36.0)
MCHC: 36.1 g/dL — ABNORMAL HIGH (ref 30.0–36.0)
MCV: 88.1 fL (ref 80.0–100.0)
MCV: 84.7 fL (ref 80.0–100.0)
MCV: 85.7 fL (ref 80.0–100.0)
Platelets: 55 10*3/uL — ABNORMAL LOW (ref 150–400)
Platelets: 79 10*3/uL — ABNORMAL LOW (ref 150–400)
Platelets: 74 10*3/uL — ABNORMAL LOW (ref 150–400)
RBC: 1.35 MIL/uL — ABNORMAL LOW (ref 3.87–5.11)
RBC: 3.21 MIL/uL — ABNORMAL LOW (ref 3.87–5.11)
RBC: 3.14 MIL/uL — ABNORMAL LOW (ref 3.87–5.11)
RDW: 14.9 % (ref 11.5–15.5)
RDW: 14 % (ref 11.5–15.5)
RDW: 14.4 % (ref 11.5–15.5)
WBC: 11.7 10*3/uL — ABNORMAL HIGH (ref 4.0–10.5)
WBC: 15.8 10*3/uL — ABNORMAL HIGH (ref 4.0–10.5)
WBC: 13.7 10*3/uL — ABNORMAL HIGH (ref 4.0–10.5)
nRBC: 0 % (ref 0.0–0.2)
nRBC: 0 % (ref 0.0–0.2)
nRBC: 0.1 % (ref 0.0–0.2)

## 2024-03-28 LAB — BPAM FFP
Blood Product Expiration Date: 202505122359
Blood Product Expiration Date: 202505122359
ISSUE DATE / TIME: 202505081947
ISSUE DATE / TIME: 202505082318
Unit Type and Rh: 6200
Unit Type and Rh: 6200

## 2024-03-28 LAB — PREPARE CRYOPRECIPITATE: Unit division: 0

## 2024-03-28 LAB — ANA: Anti Nuclear Antibody (ANA): NEGATIVE

## 2024-03-28 LAB — PROTIME-INR
INR: 1.2 (ref 0.8–1.2)
Prothrombin Time: 15.3 s — ABNORMAL HIGH (ref 11.4–15.2)

## 2024-03-28 LAB — ANTI-SMOOTH MUSCLE ANTIBODY, IGG: F-Actin IgG: 8 U (ref 0–19)

## 2024-03-28 LAB — PREPARE FRESH FROZEN PLASMA: Unit division: 0

## 2024-03-28 LAB — PREPARE RBC (CROSSMATCH)

## 2024-03-28 LAB — MAGNESIUM
Magnesium: 6.2 mg/dL (ref 1.7–2.4)
Magnesium: 6.4 mg/dL (ref 1.7–2.4)
Magnesium: 6.6 mg/dL (ref 1.7–2.4)
Magnesium: 6.9 mg/dL (ref 1.7–2.4)

## 2024-03-28 LAB — MISC LABCORP TEST (SEND OUT): Labcorp test code: 1612

## 2024-03-28 LAB — MITOCHONDRIAL ANTIBODIES: Mitochondrial M2 Ab, IgG: 24.2 U — ABNORMAL HIGH (ref 0.0–20.0)

## 2024-03-28 LAB — BPAM CRYOPRECIPITATE
Blood Product Expiration Date: 202505122359
ISSUE DATE / TIME: 202505081825
Unit Type and Rh: 6200

## 2024-03-28 LAB — HEMOGLOBIN AND HEMATOCRIT, BLOOD
HCT: 12.2 % — ABNORMAL LOW (ref 36.0–46.0)
Hemoglobin: 4.2 g/dL — CL (ref 12.0–15.0)

## 2024-03-28 LAB — CERULOPLASMIN: Ceruloplasmin: 49.2 mg/dL — ABNORMAL HIGH (ref 19.0–39.0)

## 2024-03-28 LAB — EBV AB TO VIRAL CAPSID AG PNL, IGG+IGM
EBV VCA IgG: 48.5 U/mL — ABNORMAL HIGH (ref 0.0–17.9)
EBV VCA IgM: <36 U/mL (ref 0.0–35.9)

## 2024-03-28 LAB — CMV IGM: CMV IgM: <30 [AU]/ml (ref 0.0–29.9)

## 2024-03-28 LAB — IGG: IgG (Immunoglobin G), Serum: 906 mg/dL (ref 586–1602)

## 2024-03-28 MED ORDER — POTASSIUM CHLORIDE 10 MEQ/50ML IV SOLN
10.0000 meq | INTRAVENOUS | Status: DC
Start: 2024-03-28 — End: 2024-03-28

## 2024-03-28 MED ORDER — IOHEXOL 350 MG/ML SOLN
75.0000 mL | Freq: Once | INTRAVENOUS | Status: AC | PRN
  Administered 2024-03-28: 75 mL via INTRAVENOUS

## 2024-03-28 MED ORDER — SODIUM CHLORIDE 0.9% IV SOLUTION: Freq: Once | INTRAVENOUS | Status: AC

## 2024-03-28 MED ORDER — ONDANSETRON HCL 4 MG/2ML IJ SOLN: 4.0000 mg | Freq: Four times a day (QID) | INTRAMUSCULAR | Status: DC | PRN

## 2024-03-28 MED ORDER — POTASSIUM CHLORIDE 10 MEQ/100ML IV SOLN
10.0000 meq | INTRAVENOUS | Status: AC
  Administered 2024-03-28 (×4): 10 meq via INTRAVENOUS
  Filled 2024-03-28: qty 100

## 2024-03-28 MED ORDER — SODIUM CHLORIDE 0.9% IV SOLUTION: Freq: Once | INTRAVENOUS | Status: DC

## 2024-03-28 MED ORDER — SODIUM CHLORIDE 0.9% FLUSH: 9.0000 mL | INTRAVENOUS | Status: DC | PRN

## 2024-03-28 MED ORDER — HYDROMORPHONE 1 MG/ML IV SOLN
INTRAVENOUS | Status: DC
  Administered 2024-03-28 (×2): 30 mg via INTRAVENOUS
  Administered 2024-03-28: 2.9 mg via INTRAVENOUS
  Administered 2024-03-29 (×2): 30 mg via INTRAVENOUS
  Filled 2024-03-28: qty 30

## 2024-03-28 MED ORDER — NALOXONE HCL 0.4 MG/ML IJ SOLN: 0.4000 mg | INTRAMUSCULAR | Status: DC | PRN

## 2024-03-28 NOTE — Progress Notes (Signed)
 eLink Physician-Brief Progress Note Patient Name: Toni Klein DOB: 07/20/2001 MRN: [id number]   Date of Service  03/28/2024  HPI/Events of Note  Patient with sinus tachycardia in association with a pain scale of 8/10, heart rate did improve somewhat with iv Dilaudid  but did not completely resolve.  eICU Interventions  Oral Norco for more sustained analgesia, stat H & H to r/o a significant drop in hemoglobin contributing to tachycardia.        Gilman Olazabal U Larrell Rapozo 03/28/2024, 3:39 AM

## 2024-03-28 NOTE — Progress Notes (Signed)
 RROB to 28M . Fundus below level of pressure dressing. Scant bleeding on peripad. Notified dr Beecher Bower and orders received to d/c magnesium . Lactation by to talk with patient. RN instructed lactation not to allow pt to pump at present. Will consider starting tomorrow.

## 2024-03-28 NOTE — Lactation Note (Signed)
 This note was copied from a baby's chart.  NICU Lactation Consultation Note  Patient Name: Toni Klein Today's Date: 03/28/2024 Age:22 hours  Reason for consult: Follow-up assessment; NICU baby; Infant < 5lbs; Other (Comment); Late-preterm 34-36.6wks (severe Pre-E, HELLP, maternal ICU admission)  SUBJECTIVE Visited with family of 29 25/52 weeks old AGA NICU female "Toni Klein"; Toni Klein is a P2 and experienced breastfeeding. This LC came back to 2M10 to check on her and set up the DEBP but rapid response RN voiced that she was very weak and hardly able to keep herself awake, her Hb was down to 4.2 after multiple transfusions. Sized her flanges, brought a pumping band for hands on pumping but unable to do the fitting due to multiple IVs running. Let [name] get some rest; ICU RN said that if she feels better later tonight or tomorrow, they'll have her start pumping but she's too critical to do that right now. The [facility] office has already contacted for her re-certification appt and pump issuance; she'll let them know when her discharge time becomes closer. NICU LC shared rounds with team this morning and provided updates in Spanish to FOB. No further questions or concerns at this time, LC services to F/U tomorrow.   OBJECTIVE Infant data: Mother's Current Feeding Choice: -- (NPO)  O2 Device: Room Air FiO2 (%): 21 %  Infant feeding assessment IDFTS - Readiness: 3   Maternal data: [id number] C-Section, Low Transverse Has patient been taught Hand Expression?: No Hand Expression Comments: deferred at this time Significant Breast History:: (+) breast changes during the pregnancy Current breast feeding challenges:: NICU admission Does the patient have breastfeeding experience prior to this delivery?: Yes How long did the patient breastfeed?: 6 moths, mostly pump and bottle feeding, she would occasionally take baby to breast Pumping frequency: Pump was set up at 28  hours post-partum; however unable to initiate pumping at this time due to critical maternal state Flange Size: 21 Hands-free pumping top sizes: Small/Medium (Blue) Risk factor for low/delayed milk supply:: C/S, prematurity, infant separation, < 5 lbs, maternal ICU admission  WIC Program: Yes WIC Referral Sent?: Yes What county?: [place]  ASSESSMENT Infant: Feeding Status: NPO  Maternal: Breast are soft and tissue is compressible  INTERVENTIONS/PLAN Interventions: Interventions: DEBP Tools: Pump; Flanges; Hands-free pumping top Pump Education: Setup, frequency, and cleaning; Milk Storage  Plan: Consult Status: NICU follow-up NICU Follow-up type: New admission follow up   [employer] 03/28/2024, 4:50 PM

## 2024-03-28 NOTE — Progress Notes (Signed)
 At bedside due to critical Hgb this am Pt sitting up resting comfortably at bedside.  Notes intermittent pain.  Denies SOB or Chest pain. No flatus Overnight RN reporting increase supplemental O2 and tachycardia.  UOP adequate- recently 1000cc/5hr.  O: BP 127/61   Pulse (!) 126   Temp 99 F (37.2 C) (Oral)   Resp (!) 22   Wt 60.1 kg   LMP 07/15/2023   SpO2 94%   Breastfeeding Unknown   BMI 25.03 kg/m   Gen: no acute distress, color improved CV: +tachycardia Resp: Normal respiratory effort Abd: Pressure dressing clean and dry- and removed for further evaluation.   -Honeycomb ~ 50% saturated with old blood- with palpation no active bleeding noted.  Distension of left side of abdomen noted with considerable pain to palpation.  Results for orders placed or performed during the hospital encounter of 03/27/24 (from the past 24 hours)  Type and screen [facility]     Status: None (Preliminary result)   Collection Time: 03/27/24  8:40 AM  Result Value Ref Range   ABO/RH(D) A POS    Antibody Screen NEG    Sample Expiration 03/30/2024,2359    Unit Number [id number]    Blood Component Type RED CELLS,LR    Unit division 00    Status of Unit ISSUED    Transfusion Status OK TO TRANSFUSE    Crossmatch Result      Compatible Performed at [facility], [address], [place], [place] [address]    Unit Number [phone]    Blood Component Type RED CELLS,LR    Unit division 00    Status of Unit ALLOCATED    Transfusion Status OK TO TRANSFUSE    Crossmatch Result Compatible   CBC     Status: Abnormal   Collection Time: 03/27/24  8:47 AM  Result Value Ref Range   WBC 14.0 (H) 4.0 - 10.5 K/uL   RBC 4.58 3.87 - 5.11 MIL/uL   Hemoglobin 14.2 12.0 - 15.0 g/dL   HCT 44.0 10.2 - 72.5 %   MCV 89.3 80.0 - 100.0 fL   MCH 31.0 26.0 - 34.0 pg   MCHC 34.7 30.0 - 36.0 g/dL   RDW 36.6 44.0 - 34.7 %   Platelets 163 150 - 400 K/uL   nRBC 0.0 0.0 - 0.2 %   Comprehensive metabolic panel     Status: Abnormal   Collection Time: 03/27/24  8:47 AM  Result Value Ref Range   Sodium 135 135 - 145 mmol/L   Potassium 3.8 3.5 - 5.1 mmol/L   Chloride 107 98 - 111 mmol/L   CO2 17 (L) 22 - 32 mmol/L   Glucose, Bld 120 (H) 70 - 99 mg/dL   BUN 10 6 - 20 mg/dL   Creatinine, Ser 4.25 0.44 - 1.00 mg/dL   Calcium  8.4 (L) 8.9 - 10.3 mg/dL   Total Protein 6.5 6.5 - 8.1 g/dL   Albumin  3.0 (L) 3.5 - 5.0 g/dL   AST 9,563 (H) 15 - 41 U/L   ALT 1,214 (H) 0 - 44 U/L   Alkaline Phosphatase 230 (H) 38 - 126 U/L   Total Bilirubin 1.0 0.0 - 1.2 mg/dL   GFR, Estimated >87 >56 mL/min   Anion gap 11 5 - 15  Protein / creatinine ratio, urine     Status: Abnormal   Collection Time: 03/27/24  8:47 AM  Result Value Ref Range   Creatinine, Urine 79 mg/dL  Total Protein, Urine 533 mg/dL   Protein Creatinine Ratio 6.75 (H) 0.00 - 0.15 mg/mg[Cre]  Amylase     Status: None   Collection Time: 03/27/24  8:47 AM  Result Value Ref Range   Amylase 88 28 - 100 U/L  Lipase, blood     Status: None   Collection Time: 03/27/24  8:47 AM  Result Value Ref Range   Lipase 25 11 - 51 U/L  Protime-INR     Status: Abnormal   Collection Time: 03/27/24  9:27 AM  Result Value Ref Range   Prothrombin Time 15.8 (H) 11.4 - 15.2 seconds   INR 1.2 0.8 - 1.2  APTT     Status: None   Collection Time: 03/27/24  9:27 AM  Result Value Ref Range   aPTT 31 24 - 36 seconds  Fibrinogen      Status: None   Collection Time: 03/27/24  9:27 AM  Result Value Ref Range   Fibrinogen  242 210 - 475 mg/dL  Hepatitis panel, acute     Status: None   Collection Time: 03/27/24 10:58 AM  Result Value Ref Range   Hepatitis B Surface Ag NON REACTIVE NON REACTIVE   HCV Ab NON REACTIVE NON REACTIVE   Hep A IgM NON REACTIVE NON REACTIVE   Hep B C IgM NON REACTIVE NON REACTIVE  Ammonia     Status: Abnormal   Collection Time: 03/27/24 10:58 AM  Result Value Ref Range   Ammonia 43 (H) 9 - 35 umol/L   Acetaminophen  level     Status: Abnormal   Collection Time: 03/27/24 12:06 PM  Result Value Ref Range   Acetaminophen  (Tylenol ), Serum <10 (L) 10 - 30 ug/mL  I-STAT 7, (LYTES, BLD GAS, ICA, H+H)     Status: Abnormal   Collection Time: 03/27/24  1:17 PM  Result Value Ref Range   pH, Arterial 7.231 (L) 7.35 - 7.45   pCO2 arterial 37.2 32 - 48 mmHg   pO2, Arterial 132 (H) 83 - 108 mmHg   Bicarbonate 15.8 (L) 20.0 - 28.0 mmol/L   TCO2 17 (L) 22 - 32 mmol/L   O2 Saturation 99 %   Acid-base deficit 11.0 (H) 0.0 - 2.0 mmol/L   Sodium 134 (L) 135 - 145 mmol/L   Potassium 4.2 3.5 - 5.1 mmol/L   Calcium , Ion 1.12 (L) 1.15 - 1.40 mmol/L   HCT 25.0 (L) 36.0 - 46.0 %   Hemoglobin 8.5 (L) 12.0 - 15.0 g/dL   Patient temperature 96.0 C    Sample type ARTERIAL   Glucose, capillary     Status: Abnormal   Collection Time: 03/27/24  3:32 PM  Result Value Ref Range   Glucose-Capillary 163 (H) 70 - 99 mg/dL  Salicylate level     Status: Abnormal   Collection Time: 03/27/24  3:48 PM  Result Value Ref Range   Salicylate Lvl <7.0 (L) 7.0 - 30.0 mg/dL  Magnesium      Status: Abnormal   Collection Time: 03/27/24  3:48 PM  Result Value Ref Range   Magnesium  5.9 (H) 1.7 - 2.4 mg/dL  Phosphorus     Status: None   Collection Time: 03/27/24  3:48 PM  Result Value Ref Range   Phosphorus 3.7 2.5 - 4.6 mg/dL  DIC Panel ONCE - STAT     Status: Abnormal   Collection Time: 03/27/24  3:48 PM  Result Value Ref Range   Prothrombin Time 21.2 (H) 11.4 - 15.2 seconds   INR 1.8 (  H) 0.8 - 1.2   aPTT 36 24 - 36 seconds   Fibrinogen  114 (L) 210 - 475 mg/dL   D-Dimer, Quant >81.19 (H) 0.00 - 0.50 ug/mL-FEU   Platelets 61 (L) 150 - 400 K/uL   Smear Review NO SCHISTOCYTES SEEN   Lactic acid, plasma     Status: Abnormal   Collection Time: 03/27/24  3:48 PM  Result Value Ref Range   Lactic Acid, Venous 2.9 (HH) 0.5 - 1.9 mmol/L  CBC     Status: Abnormal   Collection Time: 03/27/24  3:48 PM  Result Value Ref Range    WBC 12.6 (H) 4.0 - 10.5 K/uL   RBC 2.92 (L) 3.87 - 5.11 MIL/uL   Hemoglobin 9.2 (L) 12.0 - 15.0 g/dL   HCT 14.7 (L) 82.9 - 56.2 %   MCV 91.8 80.0 - 100.0 fL   MCH 31.5 26.0 - 34.0 pg   MCHC 34.3 30.0 - 36.0 g/dL   RDW 13.0 86.5 - 78.4 %   Platelets 59 (L) 150 - 400 K/uL   nRBC 0.0 0.0 - 0.2 %  Comprehensive metabolic panel     Status: Abnormal   Collection Time: 03/27/24  3:48 PM  Result Value Ref Range   Sodium 135 135 - 145 mmol/L   Potassium 4.5 3.5 - 5.1 mmol/L   Chloride 109 98 - 111 mmol/L   CO2 17 (L) 22 - 32 mmol/L   Glucose, Bld 158 (H) 70 - 99 mg/dL   BUN 9 6 - 20 mg/dL   Creatinine, Ser 6.96 0.44 - 1.00 mg/dL   Calcium  7.1 (L) 8.9 - 10.3 mg/dL   Total Protein 4.4 (L) 6.5 - 8.1 g/dL   Albumin  2.3 (L) 3.5 - 5.0 g/dL   AST 2,952 (H) 15 - 41 U/L   ALT 995 (H) 0 - 44 U/L   Alkaline Phosphatase 134 (H) 38 - 126 U/L   Total Bilirubin 6.3 (H) 0.0 - 1.2 mg/dL   GFR, Estimated >84 >13 mL/min   Anion gap 9 5 - 15  I-STAT 7, (LYTES, BLD GAS, ICA, H+H)     Status: Abnormal   Collection Time: 03/27/24  3:52 PM  Result Value Ref Range   pH, Arterial 7.282 (L) 7.35 - 7.45   pCO2 arterial 34.1 32 - 48 mmHg   pO2, Arterial 138 (H) 83 - 108 mmHg   Bicarbonate 16.2 (L) 20.0 - 28.0 mmol/L   TCO2 17 (L) 22 - 32 mmol/L   O2 Saturation 99 %   Acid-base deficit 10.0 (H) 0.0 - 2.0 mmol/L   Sodium 134 (L) 135 - 145 mmol/L   Potassium 4.5 3.5 - 5.1 mmol/L   Calcium , Ion 1.07 (L) 1.15 - 1.40 mmol/L   HCT 22.0 (L) 36.0 - 46.0 %   Hemoglobin 7.5 (L) 12.0 - 15.0 g/dL   Patient temperature 24.4 F    Collection site Web designer by Operator    Sample type ARTERIAL   Prepare fresh frozen plasma     Status: None (Preliminary result)   Collection Time: 03/27/24  4:48 PM  Result Value Ref Range   Unit Number [id number]    Blood Component Type THAWED PLASMA    Unit division 00    Status of Unit ISSUED    Transfusion Status OK TO TRANSFUSE    Unit Number [id number]    Blood  Component Type THW PLS APHR    Unit division B0    Status of  Unit ISSUED    Transfusion Status      OK TO TRANSFUSE Performed at [facility], [address], [place], [place] [address]   Prepare cryoprecipitate     Status: None (Preliminary result)   Collection Time: 03/27/24  4:48 PM  Result Value Ref Range   Unit Number [id number]    Blood Component Type POOL FIBR CMPLX 2D THW    Unit division 00    Status of Unit ISSUED    Transfusion Status      OK TO TRANSFUSE Performed at [facility], [address], [place], [place] [address]   Magnesium      Status: Abnormal   Collection Time: 03/27/24  5:15 PM  Result Value Ref Range   Magnesium  5.9 (H) 1.7 - 2.4 mg/dL  Bilirubin, direct     Status: Abnormal   Collection Time: 03/27/24  5:15 PM  Result Value Ref Range   Bilirubin, Direct 4.0 (H) 0.0 - 0.2 mg/dL  Lactate dehydrogenase     Status: Abnormal   Collection Time: 03/27/24  5:15 PM  Result Value Ref Range   LDH 1,894 (H) 98 - 192 U/L  Lactic acid, plasma     Status: Abnormal   Collection Time: 03/27/24  7:03 PM  Result Value Ref Range   Lactic Acid, Venous 2.7 (HH) 0.5 - 1.9 mmol/L  Magnesium      Status: Abnormal   Collection Time: 03/27/24  7:04 PM  Result Value Ref Range   Magnesium  6.3 (HH) 1.7 - 2.4 mg/dL  Comprehensive metabolic panel with GFR     Status: Abnormal   Collection Time: 03/27/24  7:04 PM  Result Value Ref Range   Sodium 136 135 - 145 mmol/L   Potassium 4.6 3.5 - 5.1 mmol/L   Chloride 106 98 - 111 mmol/L   CO2 20 (L) 22 - 32 mmol/L   Glucose, Bld 144 (H) 70 - 99 mg/dL   BUN 9 6 - 20 mg/dL   Creatinine, Ser 8.46 0.44 - 1.00 mg/dL   Calcium  6.8 (L) 8.9 - 10.3 mg/dL   Total Protein 4.5 (L) 6.5 - 8.1 g/dL   Albumin  2.2 (L) 3.5 - 5.0 g/dL   AST 9,629 (H) 15 - 41 U/L   ALT 877 (H) 0 - 44 U/L   Alkaline Phosphatase 132 (H) 38 - 126 U/L   Total Bilirubin 8.8 (H) 0.0 - 1.2 mg/dL   GFR, Estimated >52 >84 mL/min   Anion gap 10 5 - 15   CBC     Status: Abnormal   Collection Time: 03/27/24  9:15 PM  Result Value Ref Range   WBC 15.0 (H) 4.0 - 10.5 K/uL   RBC 2.33 (L) 3.87 - 5.11 MIL/uL   Hemoglobin 7.2 (L) 12.0 - 15.0 g/dL   HCT 13.2 (L) 44.0 - 10.2 %   MCV 89.7 80.0 - 100.0 fL   MCH 30.9 26.0 - 34.0 pg   MCHC 34.4 30.0 - 36.0 g/dL   RDW 72.5 36.6 - 44.0 %   Platelets 80 (L) 150 - 400 K/uL   nRBC 0.0 0.0 - 0.2 %  DIC Panel Once-Timed     Status: Abnormal   Collection Time: 03/27/24  9:16 PM  Result Value Ref Range   Prothrombin Time 18.1 (H) 11.4 - 15.2 seconds   INR 1.5 (H) 0.8 - 1.2   aPTT 30 24 - 36 seconds   Fibrinogen  167 (L) 210 - 475 mg/dL   D-Dimer,  Quant 18.69 (H) 0.00 - 0.50 ug/mL-FEU   Platelets 80 (L) 150 - 400 K/uL   Smear Review NO SCHISTOCYTES SEEN   Magnesium      Status: Abnormal   Collection Time: 03/28/24 12:45 AM  Result Value Ref Range   Magnesium  6.2 (HH) 1.7 - 2.4 mg/dL  Hemoglobin and hematocrit, blood     Status: Abnormal   Collection Time: 03/28/24  3:37 AM  Result Value Ref Range   Hemoglobin 4.2 (LL) 12.0 - 15.0 g/dL   HCT 16.1 (L) 09.6 - 04.5 %  Prepare RBC (crossmatch)     Status: None   Collection Time: 03/28/24  4:46 AM  Result Value Ref Range   Order Confirmation      ORDER PROCESSED BY BLOOD BANK Performed at [facility], [address], [place], [place] [address]      A/P [phone] [id number] s/p pLTCS POD#1 complicated by:  1) HELLP -Currently on Mag x 24hr, Mag level remains stable and IV Mag to be discontinued this  -labs trending, CMP pending this am -BP stable  2) Heme -due to decline in Hgb, concern for enlarging hematoma with active bleeding -plan for [facility] this am -stat Abd/pelvis CT ordered this am for further evaluation -per critical care team repeat H/H also ordered to confirm.  Platelet have improved from 61 to 80  3) GI Cholecystitis -drain in place producing bilious fluid -IV [place] -gen surgery, IR and GI on board and appreciate recs  4)  Pulm -pt requiring further O2, management per critical care team -CT ordered this am  Appreciate co-management with ICU team  Toni Emrich, DO Attending Obstetrician & Gynecologist, [facility] Center for [facility]

## 2024-03-28 NOTE — Progress Notes (Signed)
 NAME:  Toni Klein, MRN:  [id number], DOB:  May 26, 2001, LOS: 1 ADMISSION DATE:  03/27/2024, CONSULTATION DATE:  03/28/24 REFERRING MD:  Shira Dopp, CHIEF COMPLAINT:  abdominal pain   History of Present Illness:  Ms. Toni Klein is a 23 y.o. F with PMH significant for ectopic pregnancy G6 P1-0-4-1  currently [redacted] weeks pregnant who presented on 5/8 with severe abdominal pain that began in the epigastric area and began radiating to the L side with associated nausea and vomiting.  Her initial work-up showed significantly elevated LFT's AST 2157 and ALT 1214 with bili 1.0. lipase WNL,. WBC 14k.  RUQ US  showed thickened GB with positive sonographic Murphy sign, no stones.  CBD confirmed patent and hepatitis panel negative, tylenol  level <10.  She was taken to the OR for cesarean section and likely cholecystectomy, she was extubated post-operatively and transferred to the ICU   Pertinent  Medical History   has a past medical history of Ectopic pregnancy.   Significant Hospital Events: Including procedures, antibiotic start and stop dates in addition to other pertinent events   5/8 presented with abdominal pain, elevated LFT's, C-section, ? Cholecystitis, ICU transfer after OR  Interim History / Subjective:  Arrived to the ICU stable   Objective    Blood pressure 127/61, pulse (!) 121, temperature 98.7 F (37.1 C), temperature source Oral, resp. rate (!) 0, weight 60.1 kg, last menstrual period 07/15/2023, SpO2 93%, unknown if currently breastfeeding.        Intake/Output Summary (Last 24 hours) at 03/28/2024 0936 Last data filed at 03/28/2024 0900 Gross per 24 hour  Intake 8542.21 ml  Output 2900 ml  Net 5642.21 ml   Filed Weights   03/27/24 0815 03/27/24 1536  Weight: 58 kg 60.1 kg    General:  well-nourished young F, ill appearing but in NAD HEENT: MM pink/moist, sclera anicteric  Neuro: alert and oriented, answering questions appropriately via spanish interpretter CV: s1s2  tachycardic, regular, no m/r/g PULM:  clear bilaterally on [place] GI: soft, bsx4 active, post-partum uterus with post-op site dressed and clean, JP drain in place with minimal dark sero-sanguinous drainage Extremities: warm/dry, no edema  Skin: no rashes or lesions  Resolved Hospital Problem list     Assessment & Plan:    HELLP Syndrome - Has been getting IV Mg, most recent Mg level 6.6 - serial CMP, LFT  - Stopping fluids -BP control, currently normotensive -post-partum care per the primary team -monitor hgb, hct and platelets  -monitor UOP   #Abdominal Wall Hematoma  #Acute Bleed Seen on CT abdomen done overnight. Likely in the setting of recent C-section and small artery bleeding. Hb overnight at 4.2, currently getting 4UPRBCs, FFP. Currently Hemodynamically stable, and expecting bleeding to stop soon. Discussed with IR, Surgery, and OB/GYN and will hold on further intervention at this time until repeat CBC later this afternoon.   Plan:  - Appreciate IR, OB, and Surgery recommendations  - Will check CBC after transfusion, as well as DIC panel   #Cholecystitis  - Drain in place, currently draining. On Zosyn .     Best Practice (right click and "Reselect all SmartList Selections" daily)   Diet/type: Regular consistency (see orders) DVT prophylaxis SCD Pressure ulcer(s): N/A GI prophylaxis: N/A Lines: N/A Foley:  Yes, and it is still needed Code Status:  full code Last date of multidisciplinary goals of care discussion [pending]  Labs   CBC: Recent Labs  Lab 03/27/24 0847 03/27/24 1317 03/27/24 1548 03/27/24 1552 03/27/24 2115  03/27/24 2116 03/28/24 0337 03/28/24 0500 03/28/24 0632  WBC 14.0*  --  12.6*  --  15.0*  --   --  11.7*  --   HGB 14.2   < > 9.2* 7.5* 7.2*  --  4.2* 4.2*  --   HCT 40.9   < > 26.8* 22.0* 20.9*  --  12.2* 11.9*  --   MCV 89.3  --  91.8  --  89.7  --   --  88.1  --   PLT 163  --  59*  61*  --  80* 80*  --  55* 56*   < > = values in  this interval not displayed.    Basic Metabolic Panel: Recent Labs  Lab 03/27/24 0847 03/27/24 1317 03/27/24 1548 03/27/24 1548 03/27/24 1552 03/27/24 1715 03/27/24 1904 03/28/24 0045 03/28/24 0500 03/28/24 0804  NA 135 134* 135  --  134*  --  136  --  132*  --   K 3.8 4.2 4.5  --  4.5  --  4.6  --  3.7  --   CL 107  --  109  --   --   --  106  --  96*  --   CO2 17*  --  17*  --   --   --  20*  --  26  --   GLUCOSE 120*  --  158*  --   --   --  144*  --  107*  --   BUN 10  --  9  --   --   --  9  --  8  --   CREATININE 0.60  --  0.67  --   --   --  0.67  --  0.64  --   CALCIUM  8.4*  --  7.1*  --   --   --  6.8*  --  6.6*  --   MG  --   --  5.9*   < >  --  5.9* 6.3* 6.2* 6.4* 6.6*  PHOS  --   --  3.7  --   --   --   --   --   --   --    < > = values in this interval not displayed.   GFR: CrCl cannot be calculated (Unknown ideal weight.). Recent Labs  Lab 03/27/24 0847 03/27/24 1548 03/27/24 1903 03/27/24 2115 03/28/24 0500  WBC 14.0* 12.6*  --  15.0* 11.7*  LATICACIDVEN  --  2.9* 2.7*  --   --     Liver Function Tests: Recent Labs  Lab 03/27/24 0847 03/27/24 1548 03/27/24 1904 03/28/24 0500  AST 2,157* 3,005* 2,247* 662*  ALT 1,214* 995* 877* 384*  ALKPHOS 230* 134* 132* 82  BILITOT 1.0 6.3* 8.8* 6.7*  PROT 6.5 4.4* 4.5* 4.9*  ALBUMIN  3.0* 2.3* 2.2* 2.9*   Recent Labs  Lab 03/27/24 0847  LIPASE 25  AMYLASE 88   Recent Labs  Lab 03/27/24 1058  AMMONIA 43*    ABG    Component Value Date/Time   PHART 7.282 (L) 03/27/2024 1552   PCO2ART 34.1 03/27/2024 1552   PO2ART 138 (H) 03/27/2024 1552   HCO3 16.2 (L) 03/27/2024 1552   TCO2 17 (L) 03/27/2024 1552   ACIDBASEDEF 10.0 (H) 03/27/2024 1552   O2SAT 99 03/27/2024 1552     Coagulation Profile: Recent Labs  Lab 03/27/24 0927 03/27/24 1548 03/27/24 2116 03/28/24 0632 03/28/24 0804  INR 1.2 1.8* 1.5*  1.3* 1.2    Cardiac Enzymes: No results for input(s): "CKTOTAL", "CKMB", "CKMBINDEX",  "TROPONINI" in the last 168 hours.  HbA1C: No results found for: "HGBA1C"  CBG: Recent Labs  Lab 03/27/24 1532  GLUCAP 163*    Review of Systems:   Please see the history of present illness. All other systems reviewed and are negative    Past Medical History:  She,  has a past medical history of Ectopic pregnancy.   Surgical History:   Past Surgical History:  Procedure Laterality Date   CESAREAN SECTION N/A 03/27/2024   Procedure: CESAREAN DELIVERY;  Surgeon: Rik Chasten, MD;  Location: [facility] OR;  Service: Obstetrics;  Laterality: N/A;   EXAM UNDER ANESTHESIA, PELVIC N/A 03/27/2024   Procedure: EXAM UNDER ANESTHESIA, ABDOMINAL WITH CHOLECYSTOTOMY TUBE INSERTION;  Surgeon: Anda Bamberg, MD;  Location: [facility] OR;  Service: General;  Laterality: N/A;   NO PAST SURGERIES     TOOTH EXTRACTION       Social History:   reports that she has never smoked. She has never used smokeless tobacco. She reports that she does not drink alcohol and does not use drugs.   Family History:  Her family history includes Healthy in her father and mother.   Allergies No Known Allergies   Home Medications  Prior to Admission medications   Medication Sig Start Date End Date Taking? Authorizing Provider  aspirin  EC 81 MG tablet Take 1 tablet (81 mg total) by mouth daily. Swallow whole. 01/18/24  Yes Zelma Hidden, FNP  Prenatal Vit-Fe Fumarate-FA (MULTIVITAMIN-PRENATAL) 27-0.8 MG TABS tablet Take 1 tablet by mouth daily at 12 noon.   Yes [provider]  cholecalciferol (VITAMIN D3) 25 MCG (1000 UNIT) tablet Take 1,000 Units by mouth daily. Patient not taking: Reported on 12/18/2023    [provider]  doxylamine, Sleep, (UNISOM) 25 MG tablet Take 25 mg by mouth at bedtime as needed. Patient not taking: Reported on 12/18/2023    [provider]  ondansetron  (ZOFRAN ) 4 MG tablet Take 2 tablets (8 mg total) by mouth 2 (two) times daily. Patient not taking: Reported on  02/01/2024 12/18/23   Cherlynn Cornfield, NP     Critical care time:  40 minutes     CRITICAL CARE Performed by: Toinette Lackie   Total critical care time: 40 minutes  Critical care time was exclusive of separately billable procedures and treating other patients.  Critical care was necessary to treat or prevent imminent or life-threatening deterioration.  Critical care was time spent personally by me on the following activities: development of treatment plan with patient and/or surrogate as well as nursing, discussions with consultants, evaluation of patient's response to treatment, examination of patient, obtaining history from patient or surrogate, ordering and performing treatments and interventions, ordering and review of laboratory studies, ordering and review of radiographic studies, pulse oximetry and re-evaluation of patient's condition.   Annie Roseboom, MD  [facility] Internal Medicine Resident  See Amion for pager If no response to pager , please call [phone] until 7pm After 7:00 pm call Elink  [phone]

## 2024-03-28 NOTE — Progress Notes (Signed)
 [facility] ROUNDING NOTE   Subjective: History obtained via video interpreter.  Pain controlled with medications.   Objective: Vital signs in last 24 hours: Temp:  [97.2 F (36.2 C)-99 F (37.2 C)] 98.1 F (36.7 C) (05/09 1507) Pulse Rate:  [111-127] 116 (05/09 1615) Resp:  [0-30] 19 (05/09 1615) BP: (102-127)/(59-64) 127/61 (05/09 0500) SpO2:  [89 %-98 %] 92 % (05/09 1615) Arterial Line BP: (106-151)/(58-85) 127/75 (05/09 1615)   General: NAD Abdomen: Postoperative TTP   Intake/Output from previous day: 05/08 0701 - 05/09 0700 In: 7607.1 [P.O.:640; I.V.:4812.5; Blood:1257; IV Piggyback:897.6] Out: 2900 [Urine:2400; Blood:500] Intake/Output this shift: Total I/O In: 2809.7 [I.V.:1136.3; Blood:1028.6; IV Piggyback:644.8] Out: 4350 [Urine:4350]   Lab Results: Recent Labs    03/28/24 0500 03/28/24 0632 03/28/24 1133 03/28/24 1509  WBC 11.7*  --  15.8* 13.7*  HGB 4.2*  --  10.0* 9.7*  PLT 55* 56* 76*  79* 74*  MCV 88.1  --  84.7 85.7   BMET Recent Labs    03/27/24 1548 03/27/24 1552 03/27/24 1904 03/28/24 0500  NA 135 134* 136 132*  K 4.5 4.5 4.6 3.7  CL 109  --  106 96*  CO2 17*  --  20* 26  GLUCOSE 158*  --  144* 107*  BUN 9  --  9 8  CREATININE 0.67  --  0.67 0.64  CALCIUM  7.1*  --  6.8* 6.6*   LFT Recent Labs    03/27/24 1548 03/27/24 1715 03/27/24 1904 03/28/24 0500  PROT 4.4*  --  4.5* 4.9*  ALBUMIN  2.3*  --  2.2* 2.9*  AST 3,005*  --  2,247* 662*  ALT 995*  --  877* 384*  ALKPHOS 134*  --  132* 82  BILITOT 6.3*  --  8.8* 6.7*  BILIDIR  --  4.0*  --   --    PT/INR Recent Labs    03/28/24 0804 03/28/24 1133  INR 1.2 1.2      Imaging/Other results: CT CHEST ABDOMEN PELVIS W CONTRAST Result Date: 03/28/2024 CLINICAL DATA:  Status post emergent Caesarean section for gangrenous cholecystitis, ultimately with cholecystostomy tube placement at the time of surgery. Now with abdominal pain and concern for expanding  hematoma. EXAM: CT CHEST, ABDOMEN, AND PELVIS WITH CONTRAST TECHNIQUE: Multidetector CT imaging of the chest, abdomen and pelvis was performed following the standard protocol during bolus administration of intravenous contrast. RADIATION DOSE REDUCTION: This exam was performed according to the departmental dose-optimization program which includes automated exposure control, adjustment of the mA and/or kV according to patient size and/or use of iterative reconstruction technique. CONTRAST:  75mL OMNIPAQUE  IOHEXOL  350 MG/ML SOLN COMPARISON:  03/27/2024 FINDINGS: CT CHEST FINDINGS Cardiovascular: Heart size upper normal. No substantial pericardial effusion. No thoracic aortic aneurysm. Linear density along the lateral wall of the transverse aorta is favored to represent motion artifact given similar appearance in the adjacent pulmonary outflow tract. Mediastinum/Nodes: No mediastinal lymphadenopathy. There is no hilar lymphadenopathy. Fluid in the esophagus is likely secondary to reflux given the fluid distended stomach. There is no axillary lymphadenopathy. Lungs/Pleura: Dependent collapse/consolidative opacity is seen in both lungs with small to moderate right and small left pleural effusions. Musculoskeletal: No worrisome lytic or sclerotic osseous abnormality. CT ABDOMEN PELVIS FINDINGS Hepatobiliary: No suspicious focal abnormality within the liver parenchyma. Gallbladder is decompressed with marked diffuse gallbladder wall thickening and edema. The balloon from the cholecystostomy tube is identified in the region of the gallbladder fundus, perhaps best seen on sagittal image 45  of series 7. Intraluminal positioning of the balloon/catheter tip cannot be confirmed on this study. No intrahepatic or extrahepatic biliary dilation. Pancreas: No focal mass lesion. No dilatation of the main duct. No intraparenchymal cyst. No peripancreatic edema. Spleen: No splenomegaly. No suspicious focal mass lesion. Adrenals/Urinary  Tract: No adrenal nodule or mass. Kidneys unremarkable. No evidence for hydroureter. Urinary bladder is decompressed by Foley catheter. Gas in the bladder lumen is compatible with the instrumentation. Urine in the bladder has high density compatible with excreted contrast from yesterday's CT scan. Stomach/Bowel: Stomach is distended with fluid. Duodenum is nondistended. There is fluid/edema around the duodenal bulb, descending and proximal transverse duodenum. Small bowel loops are gas-filled and mildly distended up to about 2.5 cm diameter. Appendix is not well visualized. No evidence for colonic obstruction. Vascular/Lymphatic: No abdominal aortic aneurysm. No abdominal aortic atherosclerotic calcification. There is no gastrohepatic or hepatoduodenal ligament lymphadenopathy. No retroperitoneal or mesenteric lymphadenopathy. No pelvic sidewall lymphadenopathy. Reproductive: Enlarged hypervascular uterus compatible with the immediate postoperative state. Endometrial canal appears thickened. There is no adnexal mass. Other: Free fluid is identified around the inferior liver and in the right paracolic gutter fluid is also visible in the left paracolic gutter and in the pelvis. Density of the free fluid is minimally higher than would be expected for simple fluid in the gutters in density in the cul-de-sac is higher still, compatible with blood products. There appears to be a layering hematocrit level in the cul-de-sac, further compatible with small volume hemoperitoneum although infectious debris could have this appearance. Musculoskeletal: Left lower abdominal wall hematoma identified previously is minimally larger on the current exam measuring 6.0 x 4.5 cm (image 95/3) which compares to 4.8 x 3.4 cm previously. It is unclear on this study whether this hematoma is within or superficial to the rectus sheath. There is a small focus of extraluminal contrast puddling superficial to the hematoma (axial 98/series 3)  compatible with persistent active extravasation at a location similar to yesterday's exam. On delayed imaging, extravasated contrast layers dependently in the hematoma (see axial 59/series 8) compatible with ongoing bleeding/hemorrhage. Extensive edema/hemorrhage is seen in the low anterior abdominal wall, left greater than right with prominent soft tissue gas in this region, findings compatible with recent Caesarean section. IMPRESSION: 1. Left lower abdominal wall hematoma identified previously is slightly larger on the current exam measuring 6.0 x 4.5 cm which compares to 4.8 x 3.4 cm previously. There is a small focus of extraluminal contrast puddling superficial to the hematoma compatible with persistent active extravasation at a location similar to yesterday's exam. On delayed imaging, extravasated contrast layers dependently in the hematoma compatible with ongoing bleeding/hemorrhage. 2. Free fluid in the abdomen and pelvis with density higher than would be expected for simple fluid in the gutters, compatible with blood products. There appears to be a layering hematocrit level in the cul-de-sac, further compatible with small volume hemoperitoneum. Volume of intraperitoneal free fluid is generally stable in the interval. 3. Gallbladder is decompressed with marked diffuse gallbladder wall thickening and edema. The balloon from the cholecystostomy tube is identified in the region of the gallbladder fundus, perhaps best seen on sagittal imaging. Intraluminal positioning of the balloon/catheter tip cannot be confirmed on this study. 4. Dependent collapse/consolidative opacity in both lungs with small to moderate right and small left pleural effusions. Imaging features could be compatible with atelectasis and/or pneumonia. 5. Fluid in the esophagus is likely secondary to reflux given the fluid distended stomach. 6. Enlarged hypervascular uterus compatible with  the immediate postoperative state. Endometrial canal  appears thickened. 7. Gas-filled and mildly distended small bowel loops up to about 2.5 cm diameter. Imaging features are compatible with ileus. Critical Value/emergent results were called by telephone at the time of interpretation on 03/28/2024 at 6:19 am to provider Keene Pastures , who verbally acknowledged these results. Electronically Signed   By: Donnal Fusi M.D.   On: 03/28/2024 06:20   CT ABDOMEN PELVIS W CONTRAST Result Date: 03/27/2024 CLINICAL DATA:  Cholelithiasis, abdominal pain EXAM: CT ABDOMEN AND PELVIS WITH CONTRAST TECHNIQUE: Multidetector CT imaging of the abdomen and pelvis was performed using the standard protocol following bolus administration of intravenous contrast. RADIATION DOSE REDUCTION: This exam was performed according to the departmental dose-optimization program which includes automated exposure control, adjustment of the mA and/or kV according to patient size and/or use of iterative reconstruction technique. CONTRAST:  75mL OMNIPAQUE  IOHEXOL  350 MG/ML SOLN COMPARISON:  None Available. FINDINGS: Lower chest: Trace bilateral pleural effusions. Bilateral lower lobe dependent airspace opacities, likely atelectasis although pneumonia is not excluded. Hepatobiliary: Mild periportal edema. No biliary ductal dilatation. Portal vein is patent. Gallbladder wall is markedly thickened, measuring up to 1.8 cm. There appears to be gas within the decompressed collapsed lumen of the gallbladder. There appears to be a balloon retention cholecystostomy catheter in place. It is difficult to confirm that the balloon is in the lumen of the gallbladder and not within the gallbladder wall given the collapsed state of the gallbladder lumen. Pancreas: No focal abnormality or ductal dilatation. Spleen: No focal abnormality.  Normal size. Adrenals/Urinary Tract: No adrenal abnormality. No focal renal abnormality. No stones or hydronephrosis. Urinary bladder is unremarkable. Foley catheter in the in the  bladder which is decompressed. Stomach/Bowel: Stomach, large and small bowel grossly unremarkable. Vascular/Lymphatic: No evidence of aneurysm or adenopathy. Reproductive: Enlarged postpartum uterus with changes of recent C-section. Stranding and gas throughout the anterior lower abdominal wall. Other: Small amount of free fluid in the abdomen and pelvis. Small amount of free air adjacent to the liver. As noted above, changes of C-section in the anterior lower abdominal wall with stranding and gas. Left abdominal wall complex fluid collection measuring 4.8 x 3.4 cm may reflect hematoma. High-density areas noted within this fluid collection which could reflect active extravasation of contrast. Musculoskeletal: No acute or significant osseous findings. IMPRESSION: Markedly thick walled gallbladder with collapse/decompressed lumen. Findings compatible with acute cholecystitis. Balloon retention cholecystostomy in place. It is difficult to confirm exact position as the balloon appears to be in the gallbladder wall rather than the lumen. Recommend clinical correlation with drainage. Mild free fluid in the abdomen and pelvis. Trace bilateral pleural effusions. Dependent bibasilar atelectasis or infiltrates. Mild periportal edema which can be seen with volume overload. Changes of recent C-section. 4.8 x 3.4 cm fluid collection in the left lower abdominal wall with areas of high density within and adjacent to the fluid collection. This could reflect areas of active extravasation/bleeding. Electronically Signed   By: Janeece Mechanic M.D.   On: 03/27/2024 18:05   US  Abdomen Limited RUQ (LIVER/GB) Addendum Date: 03/27/2024 ADDENDUM REPORT: 03/27/2024 11:02 ADDENDUM: The original report was by Dr. Freida Jes. The following addendum is by Dr. Freida Jes: These results were called by telephone at the time of interpretation on 03/27/2024 at 10:52 AM to provider Princess Brooks , who verbally acknowledged these results.  Electronically Signed   By: Freida Jes M.D.   On: 03/27/2024 11:02   Result Date: 03/27/2024 CLINICAL DATA:  Abdominal pain.  Third trimester pregnancy. EXAM: ULTRASOUND ABDOMEN LIMITED RIGHT UPPER QUADRANT COMPARISON:  None Available. FINDINGS: Gallbladder: Abnormal appearance of the gallbladder with asymmetric wall thickening irregular and thick membranous internal contents which are nonshadowing. Sonographic Murphy sign present. No gallstones observed. Common bile duct: Diameter: 0.2 cm Liver: No focal lesion identified. Within normal limits in parenchymal echogenicity. Portal vein is patent on color Doppler imaging with normal direction of blood flow towards the liver. Other: None. IMPRESSION: 1. Sonographic Abigail Abler sign present with asymmetric gallbladder wall thickening and membranous internal contents of the gallbladder. Appearance strongly favors acute cholecystitis and gangrenous cholecystitis is a distinct possibility. Urgent general surgical consultation recommended. Radiology assistant personnel have been notified to put me in telephone contact with the referring physician or the referring physician's clinical representative in order to discuss these findings. Once this communication is established I will issue an addendum to this report for documentation purposes. Electronically Signed: By: Freida Jes M.D. On: 03/27/2024 10:39   US  MFM OB LIMITED Result Date: 03/27/2024 ----------------------------------------------------------------------  OBSTETRICS REPORT                    (Corrected Final 03/27/2024 10:26 am) ---------------------------------------------------------------------- Patient Info  ID #:       [id number]                          D.O.B.:  04-09-2001 (23 yrs)(F)  Name:       Toni Klein          Visit Date: 03/27/2024 09:29 am              [facility] ---------------------------------------------------------------------- Performed By  Attending:        Penney Bowling DO        Ref. Address:     [facility]  Performed By:     Carlene Che          Secondary Phy.:   [facility]/Triage                    RDMS  Referred By:      Mardee Shackle                 [facility]                    [facility] ---------------------------------------------------------------------- Orders  #  Description                           Code        Ordered By  1  US  MFM OB LIMITED                     08657.84    Princess Brooks ----------------------------------------------------------------------  #  Order #                     Accession #                Episode #  1  [id number]                   [id number]  [id number] ---------------------------------------------------------------------- Indications  Abdominal pain in pregnancy                    O99.89  [redacted] weeks gestation of pregnancy                Z3A.36 ---------------------------------------------------------------------- Fetal Evaluation  Num Of Fetuses:         1  Fetal Heart Rate(bpm):  153  Cardiac Activity:       Observed  Presentation:           Cephalic  Placenta:               Posterior  P. Cord Insertion:      Previously seen  Amniotic Fluid  AFI FV:      Within normal limits  AFI Sum(cm)     %Tile       Largest Pocket(cm)  12.9            45          4.7  RUQ(cm)       RLQ(cm)       LUQ(cm)        LLQ(cm)  4.7           3.4           3.3            1.5  Comment:    No placental abruption or previa identified. ---------------------------------------------------------------------- OB History  Gravidity:    6         Term:   1         SAB:   3  Ectopic:      1        Living:  1 ---------------------------------------------------------------------- Gestational Age  LMP:           36w 4d        Date:  07/15/23                  EDD:   04/20/24  Best:          36w 4d     Det. By:  LMP  (07/15/23)          EDD:   04/20/24 ----------------------------------------------------------------------  Anatomy  Diaphragm:             Appears normal         Kidneys:                Appear normal  Stomach:               Appears normal, left   Bladder:                Appears normal                         sided ---------------------------------------------------------------------- Cervix Uterus Adnexa  Cervix  Not visualized (advanced GA >24wks)  Uterus  No abnormality visualized.  Right Ovary  Within normal limits.  Left Ovary  Within normal limits.  Adnexa  No abnormality visualized ---------------------------------------------------------------------- Comments  Hospital Ultrasound  The patient presented to the MAU for abdominal pain that  was sudden onset and woke the patient from sleep. Liver  enzymes are 2,157 and 1,214 with normla bilirubin and plt  (163k). BP is slightly elevated but the patient is in extreme  pain per the [facility] provider.  Sonographic findings  Single intrauterine pregnancy at 36w 4d  Fetal cardiac activity:  Observed and appears normal.  Presentation: Cephalic.  Limited fetal anatomy appears normal.  Amniotic fluid: Within normal limits.  MVP: 4.7 cm.  Placenta: Posterior. There is no sonographic evidence of  bleeding but the placenta appears very calcified.  Recommendations  - See MFM plan of care  This was a limited ultrasound with a remote read. If an official  MFM consult is requested for any reason please call/place an  order in Epic. ----------------------------------------------------------------------                      Penney Bowling, DO Electronically Signed Corrected Final Report  03/27/2024 10:26 am ----------------------------------------------------------------------      Assessment and Plan:  1) Elevated liver enzymes 2) Elevated bilirubin 3) HELLP  Liver enzymes significantly elevated on admission with peak AST/ALT 3005/995, respectively.  Enzymes now downtrending, currently 662/384.  T. bili also downtrending at 6.7.  Suspect 2/2 cholecystitis, HELLP.  Extended serologic  workup to evaluate for concomitant liver disease largely still pending, but viral hepatitis panel and CMV negative. - Will follow-up on pending labs - Continue daily liver enzymes - Daily CBC checks - PLT improved after platelet transfusion  3) Anemia 4) DIC Significant decline in H/H overnight.  Serial CT studies show left lower abdominal wall hematoma with small focus of extraluminal contrast compatible with extravasation, along with blood products in the pericolic gutters. - Has been treated with 4 units RBCs, FFP, cryo - Good response to RBC transfusion with H/H 10/27 - Management of hematoma per OB/GYN, surgical, and IR services  5) Cholecystitis S/p open cholecystostomy tube 03/27/2024 - Management per Surgical and IR services    Annis Kinder, DO  03/28/2024, 4:16 PM [facility] Pager [phone]

## 2024-03-28 NOTE — Progress Notes (Signed)
 Referring Physician(s): Dr. Aniceto Barley  Supervising Physician: Fernando Hoyer  Patient Status:  [facility] - In-pt  Chief Complaint: HELLP syndrome  Subjective: Resting comfortably.  Denies pain this AM.  Gall bladder drain with bilious output.   Allergies: Patient has no known allergies.  Medications: Prior to Admission medications   Medication Sig Start Date End Date Taking? Authorizing Provider  aspirin  EC 81 MG tablet Take 1 tablet (81 mg total) by mouth daily. Swallow whole. 01/18/24  Yes Zelma Hidden, FNP  Prenatal Vit-Fe Fumarate-FA (MULTIVITAMIN-PRENATAL) 27-0.8 MG TABS tablet Take 1 tablet by mouth daily at 12 noon.   Yes [provider]     Vital Signs: BP 127/61   Pulse (!) 116   Temp 98.6 F (37 C) (Oral)   Resp 15   Wt 132 lb 7.9 oz (60.1 kg)   LMP 07/15/2023   SpO2 93%   Breastfeeding Unknown   BMI 25.03 kg/m   Physical Exam NAD, alert RUQ drain in place with brown/yellow bilious output.  Post partum bleeding at expected.   Imaging: CT CHEST ABDOMEN PELVIS W CONTRAST Result Date: 03/28/2024 CLINICAL DATA:  Status post emergent Caesarean section for gangrenous cholecystitis, ultimately with cholecystostomy tube placement at the time of surgery. Now with abdominal pain and concern for expanding hematoma. EXAM: CT CHEST, ABDOMEN, AND PELVIS WITH CONTRAST TECHNIQUE: Multidetector CT imaging of the chest, abdomen and pelvis was performed following the standard protocol during bolus administration of intravenous contrast. RADIATION DOSE REDUCTION: This exam was performed according to the departmental dose-optimization program which includes automated exposure control, adjustment of the mA and/or kV according to patient size and/or use of iterative reconstruction technique. CONTRAST:  75mL OMNIPAQUE  IOHEXOL  350 MG/ML SOLN COMPARISON:  03/27/2024 FINDINGS: CT CHEST FINDINGS Cardiovascular: Heart size upper normal. No substantial pericardial effusion. No  thoracic aortic aneurysm. Linear density along the lateral wall of the transverse aorta is favored to represent motion artifact given similar appearance in the adjacent pulmonary outflow tract. Mediastinum/Nodes: No mediastinal lymphadenopathy. There is no hilar lymphadenopathy. Fluid in the esophagus is likely secondary to reflux given the fluid distended stomach. There is no axillary lymphadenopathy. Lungs/Pleura: Dependent collapse/consolidative opacity is seen in both lungs with small to moderate right and small left pleural effusions. Musculoskeletal: No worrisome lytic or sclerotic osseous abnormality. CT ABDOMEN PELVIS FINDINGS Hepatobiliary: No suspicious focal abnormality within the liver parenchyma. Gallbladder is decompressed with marked diffuse gallbladder wall thickening and edema. The balloon from the cholecystostomy tube is identified in the region of the gallbladder fundus, perhaps best seen on sagittal image 45 of series 7. Intraluminal positioning of the balloon/catheter tip cannot be confirmed on this study. No intrahepatic or extrahepatic biliary dilation. Pancreas: No focal mass lesion. No dilatation of the main duct. No intraparenchymal cyst. No peripancreatic edema. Spleen: No splenomegaly. No suspicious focal mass lesion. Adrenals/Urinary Tract: No adrenal nodule or mass. Kidneys unremarkable. No evidence for hydroureter. Urinary bladder is decompressed by Foley catheter. Gas in the bladder lumen is compatible with the instrumentation. Urine in the bladder has high density compatible with excreted contrast from yesterday's CT scan. Stomach/Bowel: Stomach is distended with fluid. Duodenum is nondistended. There is fluid/edema around the duodenal bulb, descending and proximal transverse duodenum. Small bowel loops are gas-filled and mildly distended up to about 2.5 cm diameter. Appendix is not well visualized. No evidence for colonic obstruction. Vascular/Lymphatic: No abdominal aortic  aneurysm. No abdominal aortic atherosclerotic calcification. There is no gastrohepatic or hepatoduodenal ligament lymphadenopathy. No  retroperitoneal or mesenteric lymphadenopathy. No pelvic sidewall lymphadenopathy. Reproductive: Enlarged hypervascular uterus compatible with the immediate postoperative state. Endometrial canal appears thickened. There is no adnexal mass. Other: Free fluid is identified around the inferior liver and in the right paracolic gutter fluid is also visible in the left paracolic gutter and in the pelvis. Density of the free fluid is minimally higher than would be expected for simple fluid in the gutters in density in the cul-de-sac is higher still, compatible with blood products. There appears to be a layering hematocrit level in the cul-de-sac, further compatible with small volume hemoperitoneum although infectious debris could have this appearance. Musculoskeletal: Left lower abdominal wall hematoma identified previously is minimally larger on the current exam measuring 6.0 x 4.5 cm (image 95/3) which compares to 4.8 x 3.4 cm previously. It is unclear on this study whether this hematoma is within or superficial to the rectus sheath. There is a small focus of extraluminal contrast puddling superficial to the hematoma (axial 98/series 3) compatible with persistent active extravasation at a location similar to yesterday's exam. On delayed imaging, extravasated contrast layers dependently in the hematoma (see axial 59/series 8) compatible with ongoing bleeding/hemorrhage. Extensive edema/hemorrhage is seen in the low anterior abdominal wall, left greater than right with prominent soft tissue gas in this region, findings compatible with recent Caesarean section. IMPRESSION: 1. Left lower abdominal wall hematoma identified previously is slightly larger on the current exam measuring 6.0 x 4.5 cm which compares to 4.8 x 3.4 cm previously. There is a small focus of extraluminal contrast puddling  superficial to the hematoma compatible with persistent active extravasation at a location similar to yesterday's exam. On delayed imaging, extravasated contrast layers dependently in the hematoma compatible with ongoing bleeding/hemorrhage. 2. Free fluid in the abdomen and pelvis with density higher than would be expected for simple fluid in the gutters, compatible with blood products. There appears to be a layering hematocrit level in the cul-de-sac, further compatible with small volume hemoperitoneum. Volume of intraperitoneal free fluid is generally stable in the interval. 3. Gallbladder is decompressed with marked diffuse gallbladder wall thickening and edema. The balloon from the cholecystostomy tube is identified in the region of the gallbladder fundus, perhaps best seen on sagittal imaging. Intraluminal positioning of the balloon/catheter tip cannot be confirmed on this study. 4. Dependent collapse/consolidative opacity in both lungs with small to moderate right and small left pleural effusions. Imaging features could be compatible with atelectasis and/or pneumonia. 5. Fluid in the esophagus is likely secondary to reflux given the fluid distended stomach. 6. Enlarged hypervascular uterus compatible with the immediate postoperative state. Endometrial canal appears thickened. 7. Gas-filled and mildly distended small bowel loops up to about 2.5 cm diameter. Imaging features are compatible with ileus. Critical Value/emergent results were called by telephone at the time of interpretation on 03/28/2024 at 6:19 am to provider Keene Pastures , who verbally acknowledged these results. Electronically Signed   By: Donnal Fusi M.D.   On: 03/28/2024 06:20   CT ABDOMEN PELVIS W CONTRAST Result Date: 03/27/2024 CLINICAL DATA:  Cholelithiasis, abdominal pain EXAM: CT ABDOMEN AND PELVIS WITH CONTRAST TECHNIQUE: Multidetector CT imaging of the abdomen and pelvis was performed using the standard protocol following bolus  administration of intravenous contrast. RADIATION DOSE REDUCTION: This exam was performed according to the departmental dose-optimization program which includes automated exposure control, adjustment of the mA and/or kV according to patient size and/or use of iterative reconstruction technique. CONTRAST:  75mL OMNIPAQUE  IOHEXOL  350 MG/ML SOLN  COMPARISON:  None Available. FINDINGS: Lower chest: Trace bilateral pleural effusions. Bilateral lower lobe dependent airspace opacities, likely atelectasis although pneumonia is not excluded. Hepatobiliary: Mild periportal edema. No biliary ductal dilatation. Portal vein is patent. Gallbladder wall is markedly thickened, measuring up to 1.8 cm. There appears to be gas within the decompressed collapsed lumen of the gallbladder. There appears to be a balloon retention cholecystostomy catheter in place. It is difficult to confirm that the balloon is in the lumen of the gallbladder and not within the gallbladder wall given the collapsed state of the gallbladder lumen. Pancreas: No focal abnormality or ductal dilatation. Spleen: No focal abnormality.  Normal size. Adrenals/Urinary Tract: No adrenal abnormality. No focal renal abnormality. No stones or hydronephrosis. Urinary bladder is unremarkable. Foley catheter in the in the bladder which is decompressed. Stomach/Bowel: Stomach, large and small bowel grossly unremarkable. Vascular/Lymphatic: No evidence of aneurysm or adenopathy. Reproductive: Enlarged postpartum uterus with changes of recent C-section. Stranding and gas throughout the anterior lower abdominal wall. Other: Small amount of free fluid in the abdomen and pelvis. Small amount of free air adjacent to the liver. As noted above, changes of C-section in the anterior lower abdominal wall with stranding and gas. Left abdominal wall complex fluid collection measuring 4.8 x 3.4 cm may reflect hematoma. High-density areas noted within this fluid collection which could  reflect active extravasation of contrast. Musculoskeletal: No acute or significant osseous findings. IMPRESSION: Markedly thick walled gallbladder with collapse/decompressed lumen. Findings compatible with acute cholecystitis. Balloon retention cholecystostomy in place. It is difficult to confirm exact position as the balloon appears to be in the gallbladder wall rather than the lumen. Recommend clinical correlation with drainage. Mild free fluid in the abdomen and pelvis. Trace bilateral pleural effusions. Dependent bibasilar atelectasis or infiltrates. Mild periportal edema which can be seen with volume overload. Changes of recent C-section. 4.8 x 3.4 cm fluid collection in the left lower abdominal wall with areas of high density within and adjacent to the fluid collection. This could reflect areas of active extravasation/bleeding. Electronically Signed   By: Janeece Mechanic M.D.   On: 03/27/2024 18:05   US  Abdomen Limited RUQ (LIVER/GB) Addendum Date: 03/27/2024 ADDENDUM REPORT: 03/27/2024 11:02 ADDENDUM: The original report was by Dr. Freida Jes. The following addendum is by Dr. Freida Jes: These results were called by telephone at the time of interpretation on 03/27/2024 at 10:52 AM to provider Princess Brooks , who verbally acknowledged these results. Electronically Signed   By: Freida Jes M.D.   On: 03/27/2024 11:02   Result Date: 03/27/2024 CLINICAL DATA:  Abdominal pain.  Third trimester pregnancy. EXAM: ULTRASOUND ABDOMEN LIMITED RIGHT UPPER QUADRANT COMPARISON:  None Available. FINDINGS: Gallbladder: Abnormal appearance of the gallbladder with asymmetric wall thickening irregular and thick membranous internal contents which are nonshadowing. Sonographic Murphy sign present. No gallstones observed. Common bile duct: Diameter: 0.2 cm Liver: No focal lesion identified. Within normal limits in parenchymal echogenicity. Portal vein is patent on color Doppler imaging with normal direction of  blood flow towards the liver. Other: None. IMPRESSION: 1. Sonographic Abigail Abler sign present with asymmetric gallbladder wall thickening and membranous internal contents of the gallbladder. Appearance strongly favors acute cholecystitis and gangrenous cholecystitis is a distinct possibility. Urgent general surgical consultation recommended. Radiology assistant personnel have been notified to put me in telephone contact with the referring physician or the referring physician's clinical representative in order to discuss these findings. Once this communication is established I will issue an addendum to this  report for documentation purposes. Electronically Signed: By: Freida Jes M.D. On: 03/27/2024 10:39   US  MFM OB LIMITED Result Date: 03/27/2024 ----------------------------------------------------------------------  OBSTETRICS REPORT                    (Corrected Final 03/27/2024 10:26 am) ---------------------------------------------------------------------- Patient Info  ID #:       [id number]                          D.O.B.:  2001-04-18 (23 yrs)(F)  Name:       Toni Klein          Visit Date: 03/27/2024 09:29 am              M[place] ---------------------------------------------------------------------- Performed By  Attending:        Penney Bowling DO       Ref. Address:     [facility]  Performed By:     Carlene Che          Secondary Phy.:   [facility] MAU/Triage                    RDMS  Referred By:      Mardee Shackle                 [facility] ---------------------------------------------------------------------- Orders  #  Description                           Code        Ordered By  1  US  MFM OB LIMITED                     76815.01    Princess Brooks ----------------------------------------------------------------------  #  Order #                     Accession #                Episode #  1  [id number]                    [id number]                 [id number] ---------------------------------------------------------------------- Indications  Abdominal pain in pregnancy                    O99.89  [redacted] weeks gestation of pregnancy                Z3A.36 ---------------------------------------------------------------------- Fetal Evaluation  Num Of Fetuses:         1  Fetal Heart Rate(bpm):  153  Cardiac Activity:       Observed  Presentation:           Cephalic  Placenta:               Posterior  P. Cord Insertion:      Previously seen  Amniotic Fluid  AFI FV:      Within normal limits  AFI Sum(cm)     %Tile  Largest Pocket(cm)  12.9            45          4.7  RUQ(cm)       RLQ(cm)       LUQ(cm)        LLQ(cm)  4.7           3.4           3.3            1.5  Comment:    No placental abruption or previa identified. ---------------------------------------------------------------------- OB History  Gravidity:    6         Term:   1         SAB:   3  Ectopic:      1        Living:  1 ---------------------------------------------------------------------- Gestational Age  LMP:           36w 4d        Date:  07/15/23                  EDD:   04/20/24  Best:          36w 4d     Det. By:  LMP  (07/15/23)          EDD:   04/20/24 ---------------------------------------------------------------------- Anatomy  Diaphragm:             Appears normal         Kidneys:                Appear normal  Stomach:               Appears normal, left   Bladder:                Appears normal                         sided ---------------------------------------------------------------------- Cervix Uterus Adnexa  Cervix  Not visualized (advanced GA >24wks)  Uterus  No abnormality visualized.  Right Ovary  Within normal limits.  Left Ovary  Within normal limits.  Adnexa  No abnormality visualized ---------------------------------------------------------------------- Comments  Hospital Ultrasound  The patient presented to the MAU for abdominal pain that  was sudden  onset and woke the patient from sleep. Liver  enzymes are 2,157 and 1,214 with normla bilirubin and plt  (163k). BP is slightly elevated but the patient is in extreme  pain per the [facility] provider.  Sonographic findings  Single intrauterine pregnancy at 36w 4d  Fetal cardiac activity: Observed and appears normal.  Presentation: Cephalic.  Limited fetal anatomy appears normal.  Amniotic fluid: Within normal limits.  MVP: 4.7 cm.  Placenta: Posterior. There is no sonographic evidence of  bleeding but the placenta appears very calcified.  Recommendations  - See MFM plan of care  This was a limited ultrasound with a remote read. If an official  MFM consult is requested for any reason please call/place an  order in Epic. ----------------------------------------------------------------------                      Penney Bowling, DO Electronically Signed Corrected Final Report  03/27/2024 10:26 am ----------------------------------------------------------------------    Labs:  CBC: Recent Labs    03/27/24 0847 03/27/24 1317 03/27/24 1548 03/27/24 1552 03/27/24 2115 03/27/24 2116 03/28/24 0337 03/28/24 0500 03/28/24 0632  WBC 14.0*  --  12.6*  --  15.0*  --   --  11.7*  --   HGB 14.2   < > 9.2* 7.5* 7.2*  --  4.2* 4.2*  --   HCT 40.9   < > 26.8* 22.0* 20.9*  --  12.2* 11.9*  --   PLT 163  --  59*  61*  --  80* 80*  --  55* 56*   < > = values in this interval not displayed.    COAGS: Recent Labs    03/27/24 0927 03/27/24 1548 03/27/24 2116 03/28/24 0632 03/28/24 0804  INR 1.2 1.8* 1.5* 1.3* 1.2  APTT 31 36 30 32  --     BMP: Recent Labs    03/27/24 0847 03/27/24 1317 03/27/24 1548 03/27/24 1552 03/27/24 1904 03/28/24 0500  NA 135   < > 135 134* 136 132*  K 3.8   < > 4.5 4.5 4.6 3.7  CL 107  --  109  --  106 96*  CO2 17*  --  17*  --  20* 26  GLUCOSE 120*  --  158*  --  144* 107*  BUN 10  --  9  --  9 8  CALCIUM  8.4*  --  7.1*  --  6.8* 6.6*  CREATININE 0.60  --  0.67  --  0.67  0.64  GFRNONAA >60  --  >60  --  >60 >60   < > = values in this interval not displayed.    LIVER FUNCTION TESTS: Recent Labs    03/27/24 0847 03/27/24 1548 03/27/24 1904 03/28/24 0500  BILITOT 1.0 6.3* 8.8* 6.7*  AST 2,157* 3,005* 2,247* 662*  ALT 1,214* 995* 877* 384*  ALKPHOS 230* 134* 132* 82  PROT 6.5 4.4* 4.5* 4.9*  ALBUMIN  3.0* 2.3* 2.2* 2.9*    Assessment and Plan: Severe transaminitis, acidemia, proteinuria, and probable cholecystitis in late pregnancy concerning for HELLP with possible acute cholecystitis.  Patient assessed by Dr. Mabel Savage overnight who felt tube in good position with return of bilious drainage with manipulation overnight.  Patient with ongoing evidence of hepatobiliary dysfunction with Tbili max overnight of 8.8, AST/ALT 2247/877 with improvement to 662/382 this AM.  Now also with concern for possible DIC in the setting thrombocytopenia, anemia (blood loss vs. Hemolytic), elevated PT/INR as well as concern for possible extravasation by recent CTA Abdomen Pelvis x2 overnight.  Case reviewed by Dr. Marne Sings who notes the gall bladder appears to be in adequate position and does not need replace or exchange at this time. Also notes the abdominal wall hematoma is stable in size on serial imaging and that hemodynamic instability more likely related to her HELLP that ongoing extravasation.  IR remains available, however no emergent indication given some improvement with resuscitation efforts this morning and risk of additional intervention at this time does not outweigh benefit.  Should patient fail to respond or become unstable, please notify IR.   Electronically Signed: Jazmyne Beauchesne Sue-Ellen Laney Louderback, PA 03/28/2024, 10:21 AM   I spent a total of 25 Minutes at the the patient's bedside AND on the patient's hospital floor or unit, greater than 50% of which was counseling/coordinating care for HELLP

## 2024-03-28 NOTE — Progress Notes (Signed)
 [id number] Klein called to bedside for pt hgb 4.2, tachycardic, desats, and increase in o2 needs to 6L.   0433: Dr. Ozan called to bedside to evaluate.   [id number] Klein at bedside. F/F at 1 below U, scant bleeding. Pt in pain when palpating left of umbilicus. Dr. Ozan at bedside, pressure dressing pulled up to view incision site. Honeycomb also saturated on left side. Orders placed for stat CT and transfusion x2 units PRBC.

## 2024-03-28 NOTE — Progress Notes (Signed)
 eLink Physician-Brief Progress Note Patient Name: Toni Klein DOB: [birth year] MRN: [id number]   Date of Service  03/28/2024  HPI/Events of Note  Hemoglobin 4.2 gm / dl, intermittent drop in saturation to 88 %.  eICU Interventions  OB / GYN has ordered stat transfusion of 2 units PRBC, will order stat H & H to verify result but will allow blood transfusion of first unit to proceed while hemoglobin is being verified, PRN incentive spirometer, since patient is going to radiology for an OB / GYN ordered CT abdomen will also include a CT chest.        Toni Klein 03/28/2024, 4:51 AM

## 2024-03-28 NOTE — Progress Notes (Addendum)
 eLink Physician-Brief Progress Note Patient Name: Toni Klein DOB: Aug 28, 2001 MRN: [id number]   Date of Service  03/28/2024  HPI/Events of Note  Hemoglobin 4.2 gm / dl, platelet count 50 K, last Fibrinogen  level was 160, CTA shows extravasation. K+ 3.7, Ca++ 6.6.  eICU Interventions  Transfuse 4 units of PRBC, 1 unit of platelets, and 1 unit of Cryoprecipitate, stat IR consultation. I updated Dr. Alfonzo Angst who is the incoming PCCM 62M attending physician at 7:53 a.m. He will prioritize her on arrival to the unit. KCL 40 meq iv x 1 ordered, Dr. Keene Pastures [facility] / GYN attending physician) recommended not to replace Calcium .        Lorinda Copland U Eldora Napp 03/28/2024, 6:43 AM

## 2024-03-28 NOTE — Progress Notes (Signed)
 General Surgery Follow Up Note  Subjective:    Overnight Issues:   Objective:  Vital signs for last 24 hours: Temp:  [97.2 F (36.2 C)-99 F (37.2 C)] 98.7 F (37.1 C) (05/09 0910) Pulse Rate:  [79-127] 121 (05/09 0912) Resp:  [0-26] 0 (05/09 0912) BP: (102-157)/(59-99) 127/61 (05/09 0500) SpO2:  [92 %-98 %] 93 % (05/09 0912) Arterial Line BP: (106-151)/(58-84) 133/82 (05/09 0912) Weight:  [60.1 kg] 60.1 kg (05/08 1536)  Hemodynamic parameters for last 24 hours:    Intake/Output from previous day: 05/08 0701 - 05/09 0700 In: 7607.1 [P.O.:640; I.V.:4812.5; Blood:1257; IV Piggyback:897.6] Out: 2900 [Urine:2400; Blood:500]  Intake/Output this shift: Total I/O In: 935.2 [I.V.:239.3; Blood:666.8; IV Piggyback:29] Out: 1800 [Urine:1800]  Vent settings for last 24 hours:    Physical Exam:  Gen: comfortable, no distress Neuro: follows commands, alert, communicative HEENT: PERRL Neck: supple CV: RRR Pulm: unlabored breathing on [place] Abd: soft, approrpiately TTP, drains site oozy, cholecystostomy tube draining minimal bilious o/p GU: urine clear and yellow, +Foley Extr: wwp, no edema  Results for orders placed or performed during the hospital encounter of 03/27/24 (from the past 24 hours)  Hepatitis panel, acute     Status: None   Collection Time: 03/27/24 10:58 AM  Result Value Ref Range   Hepatitis B Surface Ag NON REACTIVE NON REACTIVE   HCV Ab NON REACTIVE NON REACTIVE   Hep A IgM NON REACTIVE NON REACTIVE   Hep B C IgM NON REACTIVE NON REACTIVE  Ammonia     Status: Abnormal   Collection Time: 03/27/24 10:58 AM  Result Value Ref Range   Ammonia 43 (H) 9 - 35 umol/L  Acetaminophen  level     Status: Abnormal   Collection Time: 03/27/24 12:06 PM  Result Value Ref Range   Acetaminophen  (Tylenol ), Serum <10 (L) 10 - 30 ug/mL  CMV IgM     Status: None   Collection Time: 03/27/24 12:06 PM  Result Value Ref Range   CMV IgM <30.0 0.0 - 29.9 AU/mL  I-STAT 7, (LYTES,  BLD GAS, ICA, H+H)     Status: Abnormal   Collection Time: 03/27/24  1:17 PM  Result Value Ref Range   pH, Arterial 7.231 (L) 7.35 - 7.45   pCO2 arterial 37.2 32 - 48 mmHg   pO2, Arterial 132 (H) 83 - 108 mmHg   Bicarbonate 15.8 (L) 20.0 - 28.0 mmol/L   TCO2 17 (L) 22 - 32 mmol/L   O2 Saturation 99 %   Acid-base deficit 11.0 (H) 0.0 - 2.0 mmol/L   Sodium 134 (L) 135 - 145 mmol/L   Potassium 4.2 3.5 - 5.1 mmol/L   Calcium , Ion 1.12 (L) 1.15 - 1.40 mmol/L   HCT 25.0 (L) 36.0 - 46.0 %   Hemoglobin 8.5 (L) 12.0 - 15.0 g/dL   Patient temperature 16.1 C    Sample type ARTERIAL   Glucose, capillary     Status: Abnormal   Collection Time: 03/27/24  3:32 PM  Result Value Ref Range   Glucose-Capillary 163 (H) 70 - 99 mg/dL  Salicylate level     Status: Abnormal   Collection Time: 03/27/24  3:48 PM  Result Value Ref Range   Salicylate Lvl <7.0 (L) 7.0 - 30.0 mg/dL  Magnesium      Status: Abnormal   Collection Time: 03/27/24  3:48 PM  Result Value Ref Range   Magnesium  5.9 (H) 1.7 - 2.4 mg/dL  Phosphorus     Status: None  Collection Time: 03/27/24  3:48 PM  Result Value Ref Range   Phosphorus 3.7 2.5 - 4.6 mg/dL  DIC Panel ONCE - STAT     Status: Abnormal   Collection Time: 03/27/24  3:48 PM  Result Value Ref Range   Prothrombin Time 21.2 (H) 11.4 - 15.2 seconds   INR 1.8 (H) 0.8 - 1.2   aPTT 36 24 - 36 seconds   Fibrinogen  114 (L) 210 - 475 mg/dL   D-Dimer, Quant >16.10 (H) 0.00 - 0.50 ug/mL-FEU   Platelets 61 (L) 150 - 400 K/uL   Smear Review NO SCHISTOCYTES SEEN   Lactic acid, plasma     Status: Abnormal   Collection Time: 03/27/24  3:48 PM  Result Value Ref Range   Lactic Acid, Venous 2.9 (HH) 0.5 - 1.9 mmol/L  CBC     Status: Abnormal   Collection Time: 03/27/24  3:48 PM  Result Value Ref Range   WBC 12.6 (H) 4.0 - 10.5 K/uL   RBC 2.92 (L) 3.87 - 5.11 MIL/uL   Hemoglobin 9.2 (L) 12.0 - 15.0 g/dL   HCT 96.0 (L) 45.4 - 09.8 %   MCV 91.8 80.0 - 100.0 fL   MCH 31.5 26.0 -  34.0 pg   MCHC 34.3 30.0 - 36.0 g/dL   RDW 11.9 14.7 - 82.9 %   Platelets 59 (L) 150 - 400 K/uL   nRBC 0.0 0.0 - 0.2 %  Comprehensive metabolic panel     Status: Abnormal   Collection Time: 03/27/24  3:48 PM  Result Value Ref Range   Sodium 135 135 - 145 mmol/L   Potassium 4.5 3.5 - 5.1 mmol/L   Chloride 109 98 - 111 mmol/L   CO2 17 (L) 22 - 32 mmol/L   Glucose, Bld 158 (H) 70 - 99 mg/dL   BUN 9 6 - 20 mg/dL   Creatinine, Ser 5.62 0.44 - 1.00 mg/dL   Calcium  7.1 (L) 8.9 - 10.3 mg/dL   Total Protein 4.4 (L) 6.5 - 8.1 g/dL   Albumin  2.3 (L) 3.5 - 5.0 g/dL   AST 1,308 (H) 15 - 41 U/L   ALT 995 (H) 0 - 44 U/L   Alkaline Phosphatase 134 (H) 38 - 126 U/L   Total Bilirubin 6.3 (H) 0.0 - 1.2 mg/dL   GFR, Estimated >65 >78 mL/min   Anion gap 9 5 - 15  I-STAT 7, (LYTES, BLD GAS, ICA, H+H)     Status: Abnormal   Collection Time: 03/27/24  3:52 PM  Result Value Ref Range   pH, Arterial 7.282 (L) 7.35 - 7.45   pCO2 arterial 34.1 32 - 48 mmHg   pO2, Arterial 138 (H) 83 - 108 mmHg   Bicarbonate 16.2 (L) 20.0 - 28.0 mmol/L   TCO2 17 (L) 22 - 32 mmol/L   O2 Saturation 99 %   Acid-base deficit 10.0 (H) 0.0 - 2.0 mmol/L   Sodium 134 (L) 135 - 145 mmol/L   Potassium 4.5 3.5 - 5.1 mmol/L   Calcium , Ion 1.07 (L) 1.15 - 1.40 mmol/L   HCT 22.0 (L) 36.0 - 46.0 %   Hemoglobin 7.5 (L) 12.0 - 15.0 g/dL   Patient temperature 46.9 F    Collection site Web designer by Operator    Sample type ARTERIAL   Culture, blood (Routine X 2) w Reflex to ID Panel     Status: None (Preliminary result)   Collection Time: 03/27/24  4:11 PM  Specimen: BLOOD  Result Value Ref Range   Specimen Description BLOOD SITE NOT SPECIFIED    Special Requests      BOTTLES DRAWN AEROBIC AND ANAEROBIC Blood Culture results may not be optimal due to an inadequate volume of blood received in culture bottles   Culture      NO GROWTH < 24 HOURS Performed at [facility], [address], [place], [place] [address]     Report Status PENDING   Culture, blood (Routine X 2) w Reflex to ID Panel     Status: None (Preliminary result)   Collection Time: 03/27/24  4:11 PM   Specimen: BLOOD  Result Value Ref Range   Specimen Description BLOOD SITE NOT SPECIFIED    Special Requests      BOTTLES DRAWN AEROBIC AND ANAEROBIC Blood Culture results may not be optimal due to an inadequate volume of blood received in culture bottles   Culture      NO GROWTH < 24 HOURS Performed at [facility], [address], [place], [place] [address]    Report Status PENDING   Prepare fresh frozen plasma     Status: None   Collection Time: 03/27/24  4:48 PM  Result Value Ref Range   Unit Number [id number]    Blood Component Type THAWED PLASMA    Unit division 00    Status of Unit ISSUED,FINAL    Transfusion Status      OK TO TRANSFUSE Performed at [facility], [address], [place], [place] [address]    Unit Number [id number]    Blood Component Type THW PLS APHR    Unit division B0    Status of Unit ISSUED,FINAL    Transfusion Status OK TO TRANSFUSE   Prepare cryoprecipitate     Status: None   Collection Time: 03/27/24  4:48 PM  Result Value Ref Range   Unit Number [id number]    Blood Component Type POOL FIBR CMPLX 2D THW    Unit division 00    Status of Unit ISSUED,FINAL    Transfusion Status      OK TO TRANSFUSE Performed at [place] [facility], [address] [place], [place] [address]   Magnesium      Status: Abnormal   Collection Time: 03/27/24  5:15 PM  Result Value Ref Range   Magnesium  5.9 (H) 1.7 - 2.4 mg/dL  Bilirubin, direct     Status: Abnormal   Collection Time: 03/27/24  5:15 PM  Result Value Ref Range   Bilirubin, Direct 4.0 (H) 0.0 - 0.2 mg/dL  Lactate dehydrogenase     Status: Abnormal   Collection Time: 03/27/24  5:15 PM  Result Value Ref Range   LDH 1,894 (H) 98 - 192 U/L  Lactic acid, plasma     Status: Abnormal   Collection Time: 03/27/24  7:03 PM  Result Value  Ref Range   Lactic Acid, Venous 2.7 (HH) 0.5 - 1.9 mmol/L  Magnesium      Status: Abnormal   Collection Time: 03/27/24  7:04 PM  Result Value Ref Range   Magnesium  6.3 (HH) 1.7 - 2.4 mg/dL  Comprehensive metabolic panel with GFR     Status: Abnormal   Collection Time: 03/27/24  7:04 PM  Result Value Ref Range   Sodium 136 135 - 145 mmol/L   Potassium 4.6 3.5 - 5.1 mmol/L   Chloride 106 98 - 111 mmol/L   CO2 20 (L) 22 - 32 mmol/L  Glucose, Bld 144 (H) 70 - 99 mg/dL   BUN 9 6 - 20 mg/dL   Creatinine, Ser 1.61 0.44 - 1.00 mg/dL   Calcium  6.8 (L) 8.9 - 10.3 mg/dL   Total Protein 4.5 (L) 6.5 - 8.1 g/dL   Albumin  2.2 (L) 3.5 - 5.0 g/dL   AST 0,960 (H) 15 - 41 U/L   ALT 877 (H) 0 - 44 U/L   Alkaline Phosphatase 132 (H) 38 - 126 U/L   Total Bilirubin 8.8 (H) 0.0 - 1.2 mg/dL   GFR, Estimated >45 >40 mL/min   Anion gap 10 5 - 15  CBC     Status: Abnormal   Collection Time: 03/27/24  9:15 PM  Result Value Ref Range   WBC 15.0 (H) 4.0 - 10.5 K/uL   RBC 2.33 (L) 3.87 - 5.11 MIL/uL   Hemoglobin 7.2 (L) 12.0 - 15.0 g/dL   HCT 98.1 (L) 19.1 - 47.8 %   MCV 89.7 80.0 - 100.0 fL   MCH 30.9 26.0 - 34.0 pg   MCHC 34.4 30.0 - 36.0 g/dL   RDW 29.5 62.1 - 30.8 %   Platelets 80 (L) 150 - 400 K/uL   nRBC 0.0 0.0 - 0.2 %  DIC Panel Once-Timed     Status: Abnormal   Collection Time: 03/27/24  9:16 PM  Result Value Ref Range   Prothrombin Time 18.1 (H) 11.4 - 15.2 seconds   INR 1.5 (H) 0.8 - 1.2   aPTT 30 24 - 36 seconds   Fibrinogen  167 (L) 210 - 475 mg/dL   D-Dimer, Quant 65.78 (H) 0.00 - 0.50 ug/mL-FEU   Platelets 80 (L) 150 - 400 K/uL   Smear Review NO SCHISTOCYTES SEEN   Magnesium      Status: Abnormal   Collection Time: 03/28/24 12:45 AM  Result Value Ref Range   Magnesium  6.2 (HH) 1.7 - 2.4 mg/dL  Hemoglobin and hematocrit, blood     Status: Abnormal   Collection Time: 03/28/24  3:37 AM  Result Value Ref Range   Hemoglobin 4.2 (LL) 12.0 - 15.0 g/dL   HCT 46.9 (L) 62.9 - 52.8 %   Prepare RBC (crossmatch)     Status: None   Collection Time: 03/28/24  4:46 AM  Result Value Ref Range   Order Confirmation      ORDER PROCESSED BY BLOOD BANK Performed at [facility], [address] [place], [place] [address]   Comprehensive metabolic panel with GFR     Status: Abnormal   Collection Time: 03/28/24  5:00 AM  Result Value Ref Range   Sodium 132 (L) 135 - 145 mmol/L   Potassium 3.7 3.5 - 5.1 mmol/L   Chloride 96 (L) 98 - 111 mmol/L   CO2 26 22 - 32 mmol/L   Glucose, Bld 107 (H) 70 - 99 mg/dL   BUN 8 6 - 20 mg/dL   Creatinine, Ser 4.01 0.44 - 1.00 mg/dL   Calcium  6.6 (L) 8.9 - 10.3 mg/dL   Total Protein 4.9 (L) 6.5 - 8.1 g/dL   Albumin  2.9 (L) 3.5 - 5.0 g/dL   AST 027 (H) 15 - 41 U/L   ALT 384 (H) 0 - 44 U/L   Alkaline Phosphatase 82 38 - 126 U/L   Total Bilirubin 6.7 (H) 0.0 - 1.2 mg/dL   GFR, Estimated >25 >36 mL/min   Anion gap 10 5 - 15  Magnesium      Status: Abnormal   Collection Time: 03/28/24  5:00 AM  Result Value Ref Range   Magnesium  6.4 (HH) 1.7 - 2.4 mg/dL  CBC     Status: Abnormal   Collection Time: 03/28/24  5:00 AM  Result Value Ref Range   WBC 11.7 (H) 4.0 - 10.5 K/uL   RBC 1.35 (L) 3.87 - 5.11 MIL/uL   Hemoglobin 4.2 (LL) 12.0 - 15.0 g/dL   HCT 42.5 (L) 95.6 - 38.7 %   MCV 88.1 80.0 - 100.0 fL   MCH 31.1 26.0 - 34.0 pg   MCHC 35.3 30.0 - 36.0 g/dL   RDW 56.4 33.2 - 95.1 %   Platelets 55 (L) 150 - 400 K/uL   nRBC 0.0 0.0 - 0.2 %  DIC Panel ONCE - STAT     Status: Abnormal   Collection Time: 03/28/24  6:32 AM  Result Value Ref Range   Prothrombin Time 16.6 (H) 11.4 - 15.2 seconds   INR 1.3 (H) 0.8 - 1.2   aPTT 32 24 - 36 seconds   Fibrinogen  168 (L) 210 - 475 mg/dL   D-Dimer, Quant 8.84 (H) 0.00 - 0.50 ug/mL-FEU   Platelets 56 (L) 150 - 400 K/uL   Smear Review NO SCHISTOCYTES SEEN   Prepare RBC (crossmatch)     Status: None   Collection Time: 03/28/24  6:33 AM  Result Value Ref Range   Order Confirmation      ORDER  PROCESSED BY BLOOD BANK Performed at [facility], [address] [place], [place] [address]   Prepare platelet pheresis     Status: None (Preliminary result)   Collection Time: 03/28/24  6:34 AM  Result Value Ref Range   Unit Number [id number]    Blood Component Type PLTP2 PSORALEN TREATED    Unit division 00    Status of Unit ISSUED    Transfusion Status      OK TO TRANSFUSE Performed at [facility], [address], [place], [place] [address]    Unit Number [id number]    Blood Component Type PLTP2 PSORALEN TREATED    Unit division 00    Status of Unit ISSUED    Transfusion Status OK TO TRANSFUSE   Prepare cryoprecipitate     Status: None (Preliminary result)   Collection Time: 03/28/24  6:43 AM  Result Value Ref Range   Unit Number [id number]    Blood Component Type POOL FIBR CMPLX 2D THW    Unit division 00    Status of Unit ISSUED    Transfusion Status      OK TO TRANSFUSE Performed at [facility], [address], [place], [place] [address]   Magnesium      Status: Abnormal   Collection Time: 03/28/24  8:04 AM  Result Value Ref Range   Magnesium  6.6 (HH) 1.7 - 2.4 mg/dL  Protime-INR     Status: Abnormal   Collection Time: 03/28/24  8:04 AM  Result Value Ref Range   Prothrombin Time 15.3 (H) 11.4 - 15.2 seconds   INR 1.2 0.8 - 1.2    Assessment & Plan: The plan of care was discussed with the bedside nurse for the day, who is in agreement with this plan and no additional concerns were raised.   Present on Admission:  Elevated liver enzymes  HELLP syndrome, delivered, current hospitalization    LOS: 1 day   Additional comments:I reviewed the patient's new clinical lab test results.   and I reviewed the patients new imaging test results.  Cholecystitis in the setting of HELLP - s/p open cholecystostomy tube placement at the time of CS 5/8. Draining, continue. Trend LFTs, downtrending with the exception of Tbilil which is now rising,  but may be due to hemolysis as patient is actively in DIC. No stones seen on US /CT. FEN - strict NPO DVT - SCDs, hold chemical ppx due to bleeding concerns Dispo - ICU    Anda Bamberg, MD Trauma & General Surgery Please use [email or web address] to contact on call provider  03/28/2024  *Care during the described time interval was provided by me. I have reviewed this patient's available data, including medical history, events of note, physical examination and test results as part of my evaluation.

## 2024-03-28 NOTE — Progress Notes (Signed)
 RROB to room to assess pt. Pt alert and oriented. Reflexes 1+. Severe abdominal tenderness at left lower quad. Scan vaginal bleeding. Fundus below level of pressure dressing. Left labial edema. Ice pack applied. She is receiving blood products. Adequate urinary output. Abdominal drain patent.

## 2024-03-28 NOTE — Plan of Care (Signed)
  Problem: Education: Goal: Knowledge of disease or condition will improve Outcome: Progressing Goal: Knowledge of the prescribed therapeutic regimen will improve Outcome: Progressing   Problem: Fluid Volume: Goal: Peripheral tissue perfusion will improve Outcome: Progressing   Problem: Education: Goal: Knowledge of General Education information will improve Description: Including pain rating scale, medication(s)/side effects and non-pharmacologic comfort measures Outcome: Progressing

## 2024-03-28 NOTE — Progress Notes (Addendum)
 POD1 s/p pLTCS at36+w d/t HELLP syndrome and suspected gangrenous gall bladder  Subjective:  Pt sitting up resting comfortably at bedside.  Notes intermittent pain.  Denies SOB or Chest pain. No flatus.   O: BP 127/61   Pulse (!) 124   Temp 98.6 F (37 C) (Oral)   Resp 17   Wt 60.1 kg   LMP 07/15/2023   SpO2 92%   Breastfeeding Unknown   BMI 25.03 kg/m   Gen: no acute distress, color improved. Mild jaundice noted in eyes CV: +tachycardia Resp: Normal respiratory effort Abd: Pressure dressing clean and dry-- Distension of left side of abdomen noted with considerable pain to palpation c/w hematoma.      Latest Ref Rng & Units 03/28/2024   11:33 AM 03/28/2024    6:32 AM 03/28/2024    5:00 AM  CBC  WBC 4.0 - 10.5 K/uL 15.8   11.7   Hemoglobin 12.0 - 15.0 g/dL 16.1   4.2   Hematocrit 36.0 - 46.0 % 27.2   11.9   Platelets 150 - 400 K/uL 150 - 400 K/uL 79    76  56  55       Latest Ref Rng & Units 03/28/2024    5:00 AM 03/27/2024    7:04 PM 03/27/2024    3:52 PM  CMP  Glucose 70 - 99 mg/dL 096  045    BUN 6 - 20 mg/dL 8  9    Creatinine 4.09 - 1.00 mg/dL 8.11  9.14    Sodium 782 - 145 mmol/L 132  136  134   Potassium 3.5 - 5.1 mmol/L 3.7  4.6  4.5   Chloride 98 - 111 mmol/L 96  106    CO2 22 - 32 mmol/L 26  20    Calcium  8.9 - 10.3 mg/dL 6.6  6.8    Total Protein 6.5 - 8.1 g/dL 4.9  4.5    Total Bilirubin 0.0 - 1.2 mg/dL 6.7  8.8    Alkaline Phos 38 - 126 U/L 82  132    AST 15 - 41 U/L 662  2,247    ALT 0 - 44 U/L 384  877     DIC panel with INR 1.2, Fibrinogen  233  A/P 23yo [id number] s/p pLTCS POD#1 complicated by:  1) HELLP -s/p Mag x24 hours -labs trending, CMP much improved.  -BP stable  2) Heme - Declining HgB likely due to left rectus hematoma and some generalized oozing due to her coagulopathy. DIC now resolved s/p products.  - Had FFP and cryo yesterday - 4 units PRBC given today with HgB now 10. Will continue to monitor.  - s/p IR consult regarding the hematoma  and appreciate their input. Most likely hematoma will tamponade and stop bleeding with resolution of DIC. CT showed minimal expansion since prior CT.  - Platelets overall stable but will lag in resolution which is normal in s/o HELLP  3) GI: Cholecystitis -drain in place producing bilious fluid -IV [place] -gen surgery, IR and GI on board and appreciate recs. Okay to eat from our perspective if cleared by gen surg.   4) Pulm - Required increased O2 overnight but has been weaned down to 5L. CT shows likely collapse in lower lobes. Has incentive spirometer.    Appreciate co-management with ICU team  Lacey Pian, MD Attending Obstetrician & Gynecologist, [facility] for [facility], [facility]

## 2024-03-29 DIAGNOSIS — D62 Acute posthemorrhagic anemia: Secondary | ICD-10-CM

## 2024-03-29 DIAGNOSIS — Z9889 Other specified postprocedural states: Secondary | ICD-10-CM

## 2024-03-29 LAB — BPAM PLATELET PHERESIS
Blood Product Expiration Date: 202505102359
Blood Product Expiration Date: 202505102359
ISSUE DATE / TIME: 202505090758
ISSUE DATE / TIME: 202505090758
Unit Type and Rh: 6200
Unit Type and Rh: 7300

## 2024-03-29 LAB — PREPARE CRYOPRECIPITATE: Unit division: 0

## 2024-03-29 LAB — BPAM CRYOPRECIPITATE
Blood Product Expiration Date: 202505132359
ISSUE DATE / TIME: 202505090758
Unit Type and Rh: 6200

## 2024-03-29 LAB — COMPREHENSIVE METABOLIC PANEL WITH GFR
ALT: 262 U/L — ABNORMAL HIGH (ref 0–44)
AST: 226 U/L — ABNORMAL HIGH (ref 15–41)
Albumin: 3.3 g/dL — ABNORMAL LOW (ref 3.5–5.0)
Alkaline Phosphatase: 93 U/L (ref 38–126)
Anion gap: 9 (ref 5–15)
BUN: 9 mg/dL (ref 6–20)
CO2: 26 mmol/L (ref 22–32)
Calcium: 7.8 mg/dL — ABNORMAL LOW (ref 8.9–10.3)
Chloride: 102 mmol/L (ref 98–111)
Creatinine, Ser: 0.67 mg/dL (ref 0.44–1.00)
GFR, Estimated: >60 mL/min (ref >60)
Glucose, Bld: 80 mg/dL (ref 70–99)
Potassium: 4 mmol/L (ref 3.5–5.1)
Sodium: 137 mmol/L (ref 135–145)
Total Bilirubin: 2.3 mg/dL — ABNORMAL HIGH (ref 0.0–1.2)
Total Protein: 5.6 g/dL — ABNORMAL LOW (ref 6.5–8.1)

## 2024-03-29 LAB — PREPARE PLATELET PHERESIS
Unit division: 0
Unit division: 0

## 2024-03-29 LAB — CBC
HCT: 29.2 % — ABNORMAL LOW (ref 36.0–46.0)
Hemoglobin: 10.4 g/dL — ABNORMAL LOW (ref 12.0–15.0)
MCH: 31.1 pg (ref 26.0–34.0)
MCHC: 35.6 g/dL (ref 30.0–36.0)
MCV: 87.4 fL (ref 80.0–100.0)
Platelets: 87 10*3/uL — ABNORMAL LOW (ref 150–400)
RBC: 3.34 MIL/uL — ABNORMAL LOW (ref 3.87–5.11)
RDW: 15 % (ref 11.5–15.5)
WBC: 12.8 10*3/uL — ABNORMAL HIGH (ref 4.0–10.5)
nRBC: 0 % (ref 0.0–0.2)

## 2024-03-29 MED ORDER — OXYCODONE HCL 5 MG PO TABS
10.0000 mg | ORAL_TABLET | ORAL | Status: DC | PRN
  Administered 2024-03-29: 10 mg via ORAL
  Administered 2024-03-29 – 2024-03-30 (×3): 15 mg via ORAL
  Administered 2024-03-31: 10 mg via ORAL
  Administered 2024-03-31 (×2): 15 mg via ORAL
  Administered 2024-04-01: 10 mg via ORAL
  Administered 2024-04-01: 15 mg via ORAL
  Filled 2024-03-29: qty 3
  Filled 2024-03-29: qty 2
  Filled 2024-03-29 (×3): qty 3
  Filled 2024-03-29: qty 2
  Filled 2024-03-29 (×2): qty 3
  Filled 2024-03-29: qty 2
  Filled 2024-03-29: qty 3

## 2024-03-29 MED ORDER — HYDROMORPHONE HCL 1 MG/ML IJ SOLN
1.0000 mg | INTRAMUSCULAR | Status: DC | PRN
  Administered 2024-03-30 – 2024-04-01 (×7): 1 mg via INTRAVENOUS
  Filled 2024-03-29 (×7): qty 1

## 2024-03-29 MED ORDER — ACETAMINOPHEN 500 MG PO TABS
1000.0000 mg | ORAL_TABLET | Freq: Once | ORAL | Status: AC
  Administered 2024-03-29: 1000 mg via ORAL
  Filled 2024-03-29: qty 2

## 2024-03-29 MED ORDER — CAFFEINE 200 MG PO TABS
100.0000 mg | ORAL_TABLET | Freq: Four times a day (QID) | ORAL | Status: DC | PRN
  Administered 2024-03-29: 100 mg via ORAL
  Filled 2024-03-29 (×2): qty 1

## 2024-03-29 MED ORDER — GABAPENTIN 100 MG PO CAPS
200.0000 mg | ORAL_CAPSULE | Freq: Two times a day (BID) | ORAL | Status: DC
  Administered 2024-03-29 – 2024-04-01 (×6): 200 mg via ORAL
  Filled 2024-03-29 (×6): qty 2

## 2024-03-29 NOTE — Progress Notes (Addendum)
 Patient ID: Toni Klein, female   DOB: 02-Mar-2001, 23 y.o.   MRN: [id number]     Attending physician's note   I have taken a history, reviewed the chart, and examined the patient. I performed a substantive portion of this encounter, including complete performance of at least one of the key components, in conjunction with the APP. I agree with the APP's note, impression, and recommendations with my edits.   States she is feeling better.  Pain improving.  Liver enzymes downtrending with AST/ALT 226/262, T. bili 2.3, ALP normal.  H/H stable at 10.4/29 with PLT 87 and downtrending WBC.  Normal/negative IgG, ASMA, ANA, CMV IgM, EBV IgM, ceruloplasmin, APAP, salicylates, viral hepatitis panel.  AMA 24.2 (equivocal)  1) HELLP 2) Cholecystitis s/p open cholecystostomy tube 3) DIC 4) s/p C-section - Liver enzymes downtrending nicely.  Expect these will normalize.  Extended serologic workup negative for other etiologies for concomitant liver disease (i.e. viral hepatitis, autoimmune hepatitis, toxin, etc.) - Drain management per General Surgery and IR - Hematoma and anemia management per primary OB service  Inpatient GI service will sign off at this time.  Please do not hesitate to contact us  with additional questions or concerns.  Tylia Ewell, DO, FACG [phone] office          Progress Note   Subjective   Day # 2 CC: Elevated LFTs, HELLP  syndrome, DIC Status post C-section 03/27/2024 and placement of cholecystostomy tube for acute cholecystitis/gangrenous gallbladder  Afebrile overnight WBC 12.8/hemoglobin 10.4/hematocrit 29.2/platelets 87-status post multiple blood products yesterday Sodium 137/potassium 4.0/BUN 9/creatinine 0.67 T. bili 2.3/alk phos 93/AST 226/ALT 262 improving  Conversation with assistance of Spanish video interpreter-patient says she is feeling much better today significantly less abdominal pain, tolerating clear liquids and feels that  she could handle more food, no nausea or vomiting, plans to start pumping today .Asking how much longer she will need to be in the hospital, missing her baby   Objective   Vital signs in last 24 hours: Temp:  [98.1 F (36.7 C)-99.3 F (37.4 C)] 98.9 F (37.2 C) (05/10 0825) Pulse Rate:  [91-126] 92 (05/10 0800) Resp:  [0-30] 26 (05/10 0846) SpO2:  [89 %-100 %] 98 % (05/10 0846) Arterial Line BP: (110-159)/(69-89) 123/77 (05/10 0800) Last BM Date :  (PTA) General:   Non-English-speaking young Hispanic female in NAD Heart:  Regular rate and rhythm; no murmurs Lungs: Respirations even and unlabored, lungs CTA bilaterally Abdomen:  Soft, incisions dressed, cholecystostomy tube in place with bilious output Extremities:  Without edema. Neurologic:  Alert and oriented,  grossly normal neurologically. Psych:  Cooperative. Normal mood and affect.  Intake/Output from previous day: 05/09 0701 - 05/10 0700 In: 3202.9 [P.O.:240; I.V.:1200.5; Blood:1028.6; IV Piggyback:733.7] Out: 7000 [Urine:6900; Drains:100] Intake/Output this shift: Total I/O In: 5.8 [IV Piggyback:5.8] Out: 450 [Urine:450]  Lab Results: Recent Labs    03/28/24 1133 03/28/24 1509 03/29/24 0330  WBC 15.8* 13.7* 12.8*  HGB 10.0* 9.7* 10.4*  HCT 27.2* 26.9* 29.2*  PLT 76*  79* 74* 87*   BMET Recent Labs    03/28/24 0500 03/28/24 1602 03/29/24 0330  NA 132* 137 137  K 3.7 3.9 4.0  CL 96* 99 102  CO2 26 27 26   GLUCOSE 107* 84 80  BUN 8 7 9   CREATININE 0.64 0.66 0.67  CALCIUM  6.6* 6.9* 7.8*   LFT Recent Labs    03/27/24 1715 03/27/24 1904 03/29/24 0330  PROT  --    < >  5.6*  ALBUMIN   --    < > 3.3*  AST  --    < > 226*  ALT  --    < > 262*  ALKPHOS  --    < > 93  BILITOT  --    < > 2.3*  BILIDIR 4.0*  --   --    < > = values in this interval not displayed.   PT/INR Recent Labs    03/28/24 0804 03/28/24 1133  LABPROT 15.3* 15.7*  INR 1.2 1.2    Studies/Results: CT CHEST ABDOMEN PELVIS W  CONTRAST Result Date: 03/28/2024 CLINICAL DATA:  Status post emergent Caesarean section for gangrenous cholecystitis, ultimately with cholecystostomy tube placement at the time of surgery. Now with abdominal pain and concern for expanding hematoma. EXAM: CT CHEST, ABDOMEN, AND PELVIS WITH CONTRAST TECHNIQUE: Multidetector CT imaging of the chest, abdomen and pelvis was performed following the standard protocol during bolus administration of intravenous contrast. RADIATION DOSE REDUCTION: This exam was performed according to the departmental dose-optimization program which includes automated exposure control, adjustment of the mA and/or kV according to patient size and/or use of iterative reconstruction technique. CONTRAST:  75mL OMNIPAQUE  IOHEXOL  350 MG/ML SOLN COMPARISON:  03/27/2024 FINDINGS: CT CHEST FINDINGS Cardiovascular: Heart size upper normal. No substantial pericardial effusion. No thoracic aortic aneurysm. Linear density along the lateral wall of the transverse aorta is favored to represent motion artifact given similar appearance in the adjacent pulmonary outflow tract. Mediastinum/Nodes: No mediastinal lymphadenopathy. There is no hilar lymphadenopathy. Fluid in the esophagus is likely secondary to reflux given the fluid distended stomach. There is no axillary lymphadenopathy. Lungs/Pleura: Dependent collapse/consolidative opacity is seen in both lungs with small to moderate right and small left pleural effusions. Musculoskeletal: No worrisome lytic or sclerotic osseous abnormality. CT ABDOMEN PELVIS FINDINGS Hepatobiliary: No suspicious focal abnormality within the liver parenchyma. Gallbladder is decompressed with marked diffuse gallbladder wall thickening and edema. The balloon from the cholecystostomy tube is identified in the region of the gallbladder fundus, perhaps best seen on sagittal image 45 of series 7. Intraluminal positioning of the balloon/catheter tip cannot be confirmed on this study.  No intrahepatic or extrahepatic biliary dilation. Pancreas: No focal mass lesion. No dilatation of the main duct. No intraparenchymal cyst. No peripancreatic edema. Spleen: No splenomegaly. No suspicious focal mass lesion. Adrenals/Urinary Tract: No adrenal nodule or mass. Kidneys unremarkable. No evidence for hydroureter. Urinary bladder is decompressed by Foley catheter. Gas in the bladder lumen is compatible with the instrumentation. Urine in the bladder has high density compatible with excreted contrast from yesterday's CT scan. Stomach/Bowel: Stomach is distended with fluid. Duodenum is nondistended. There is fluid/edema around the duodenal bulb, descending and proximal transverse duodenum. Small bowel loops are gas-filled and mildly distended up to about 2.5 cm diameter. Appendix is not well visualized. No evidence for colonic obstruction. Vascular/Lymphatic: No abdominal aortic aneurysm. No abdominal aortic atherosclerotic calcification. There is no gastrohepatic or hepatoduodenal ligament lymphadenopathy. No retroperitoneal or mesenteric lymphadenopathy. No pelvic sidewall lymphadenopathy. Reproductive: Enlarged hypervascular uterus compatible with the immediate postoperative state. Endometrial canal appears thickened. There is no adnexal mass. Other: Free fluid is identified around the inferior liver and in the right paracolic gutter fluid is also visible in the left paracolic gutter and in the pelvis. Density of the free fluid is minimally higher than would be expected for simple fluid in the gutters in density in the cul-de-sac is higher still, compatible with blood products. There appears to be a layering  hematocrit level in the cul-de-sac, further compatible with small volume hemoperitoneum although infectious debris could have this appearance. Musculoskeletal: Left lower abdominal wall hematoma identified previously is minimally larger on the current exam measuring 6.0 x 4.5 cm (image 95/3) which  compares to 4.8 x 3.4 cm previously. It is unclear on this study whether this hematoma is within or superficial to the rectus sheath. There is a small focus of extraluminal contrast puddling superficial to the hematoma (axial 98/series 3) compatible with persistent active extravasation at a location similar to yesterday's exam. On delayed imaging, extravasated contrast layers dependently in the hematoma (see axial 59/series 8) compatible with ongoing bleeding/hemorrhage. Extensive edema/hemorrhage is seen in the low anterior abdominal wall, left greater than right with prominent soft tissue gas in this region, findings compatible with recent Caesarean section. IMPRESSION: 1. Left lower abdominal wall hematoma identified previously is slightly larger on the current exam measuring 6.0 x 4.5 cm which compares to 4.8 x 3.4 cm previously. There is a small focus of extraluminal contrast puddling superficial to the hematoma compatible with persistent active extravasation at a location similar to yesterday's exam. On delayed imaging, extravasated contrast layers dependently in the hematoma compatible with ongoing bleeding/hemorrhage. 2. Free fluid in the abdomen and pelvis with density higher than would be expected for simple fluid in the gutters, compatible with blood products. There appears to be a layering hematocrit level in the cul-de-sac, further compatible with small volume hemoperitoneum. Volume of intraperitoneal free fluid is generally stable in the interval. 3. Gallbladder is decompressed with marked diffuse gallbladder wall thickening and edema. The balloon from the cholecystostomy tube is identified in the region of the gallbladder fundus, perhaps best seen on sagittal imaging. Intraluminal positioning of the balloon/catheter tip cannot be confirmed on this study. 4. Dependent collapse/consolidative opacity in both lungs with small to moderate right and small left pleural effusions. Imaging features could be  compatible with atelectasis and/or pneumonia. 5. Fluid in the esophagus is likely secondary to reflux given the fluid distended stomach. 6. Enlarged hypervascular uterus compatible with the immediate postoperative state. Endometrial canal appears thickened. 7. Gas-filled and mildly distended small bowel loops up to about 2.5 cm diameter. Imaging features are compatible with ileus. Critical Value/emergent results were called by telephone at the time of interpretation on 03/28/2024 at 6:19 am to provider Keene Pastures , who verbally acknowledged these results. Electronically Signed   By: Donnal Fusi M.D.   On: 03/28/2024 06:20   CT ABDOMEN PELVIS W CONTRAST Result Date: 03/27/2024 CLINICAL DATA:  Cholelithiasis, abdominal pain EXAM: CT ABDOMEN AND PELVIS WITH CONTRAST TECHNIQUE: Multidetector CT imaging of the abdomen and pelvis was performed using the standard protocol following bolus administration of intravenous contrast. RADIATION DOSE REDUCTION: This exam was performed according to the departmental dose-optimization program which includes automated exposure control, adjustment of the mA and/or kV according to patient size and/or use of iterative reconstruction technique. CONTRAST:  75mL OMNIPAQUE  IOHEXOL  350 MG/ML SOLN COMPARISON:  None Available. FINDINGS: Lower chest: Trace bilateral pleural effusions. Bilateral lower lobe dependent airspace opacities, likely atelectasis although pneumonia is not excluded. Hepatobiliary: Mild periportal edema. No biliary ductal dilatation. Portal vein is patent. Gallbladder wall is markedly thickened, measuring up to 1.8 cm. There appears to be gas within the decompressed collapsed lumen of the gallbladder. There appears to be a balloon retention cholecystostomy catheter in place. It is difficult to confirm that the balloon is in the lumen of the gallbladder and not within the gallbladder wall  given the collapsed state of the gallbladder lumen. Pancreas: No focal abnormality  or ductal dilatation. Spleen: No focal abnormality.  Normal size. Adrenals/Urinary Tract: No adrenal abnormality. No focal renal abnormality. No stones or hydronephrosis. Urinary bladder is unremarkable. Foley catheter in the in the bladder which is decompressed. Stomach/Bowel: Stomach, large and small bowel grossly unremarkable. Vascular/Lymphatic: No evidence of aneurysm or adenopathy. Reproductive: Enlarged postpartum uterus with changes of recent C-section. Stranding and gas throughout the anterior lower abdominal wall. Other: Small amount of free fluid in the abdomen and pelvis. Small amount of free air adjacent to the liver. As noted above, changes of C-section in the anterior lower abdominal wall with stranding and gas. Left abdominal wall complex fluid collection measuring 4.8 x 3.4 cm may reflect hematoma. High-density areas noted within this fluid collection which could reflect active extravasation of contrast. Musculoskeletal: No acute or significant osseous findings. IMPRESSION: Markedly thick walled gallbladder with collapse/decompressed lumen. Findings compatible with acute cholecystitis. Balloon retention cholecystostomy in place. It is difficult to confirm exact position as the balloon appears to be in the gallbladder wall rather than the lumen. Recommend clinical correlation with drainage. Mild free fluid in the abdomen and pelvis. Trace bilateral pleural effusions. Dependent bibasilar atelectasis or infiltrates. Mild periportal edema which can be seen with volume overload. Changes of recent C-section. 4.8 x 3.4 cm fluid collection in the left lower abdominal wall with areas of high density within and adjacent to the fluid collection. This could reflect areas of active extravasation/bleeding. Electronically Signed   By: Janeece Mechanic M.D.   On: 03/27/2024 18:05       Assessment / Plan:    #8 23 year old Hispanic female resenting with acute severe abdominal pain at 36 weeks pregnancy Found to  have significantly elevated LFTs, parameters workup consistent with acute cholecystitis, HELLP syndrome and DIC  She underwent emergency C-section on 03/27/2024 and also had cholecystostomy tube placed at that same setting  Patient required multiple blood products yesterday in setting of DIC and development of a large hematoma at her C-section site   DIC parameters have significantly improved, as have LFTs Hemoglobin has stabilized, no evidence for further bleeding from the hematoma  Plan; will advance to full liquids, we will be able to take solid food soon 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 Continues on IV Rocephin  for acute cholecystitis, will require cholecystectomy at a later date Continue to trend LFTs GI will not continue to follow daily, happy she has had rapid improvement.  Available if needed     Principal Problem:   Pregnancy Active Problems:   Elevated liver enzymes   Abdominal pain, epigastric   Hemolysis, elevated liver enzymes, and low platelet (HELLP) syndrome during pregnancy, antepartum   HELLP syndrome, delivered, current hospitalization     LOS: 2 days   Amy EsterwoodPA-C  03/29/2024, 10:10 AM

## 2024-03-29 NOTE — Progress Notes (Addendum)
 Assessment & Plan:  Present on Admission:  Elevated liver enzymes  HELLP syndrome, delivered, current hospitalization   Cholecystitis in the setting of HELLP POD#2 - s/p open cholecystostomy tube placement at the time of CS 03/27/24 - Dr. Aniceto Barley. - bilious output to drainage bag - LFT's improving - Hgb stable - GI following  FEN - CLD DVT - SCDs, hold chemical ppx due to bleeding concerns Dispo - ICU   Cholecystostomy with remain on drainage as LFT's improve and clinical symdrome resolves.  Will need cholagiogram in IR prior to discharge with possible "capping" of the tube.  Cholecystostomy to remain in place approx 6 weeks before either removal or cholecystectomy.  Surgery will follow.        Oralee Billow, MD [facility] practice Office: [phone]        Chief Complaint: HELLP syndrome, cholecystitis  Subjective: Patient in bed, OB/GYN in room evaluating.  Translates.  Objective: Vital signs in last 24 hours: Temp:  [98.1 F (36.7 C)-99.3 F (37.4 C)] 98.9 F (37.2 C) (05/10 0825) Pulse Rate:  [91-126] 92 (05/10 0800) Resp:  [0-30] 26 (05/10 0846) SpO2:  [89 %-100 %] 98 % (05/10 0846) Arterial Line BP: (110-159)/(69-89) 123/77 (05/10 0800) Last BM Date :  (PTA)  Intake/Output from previous day: 05/09 0701 - 05/10 0700 In: 3202.9 [P.O.:240; I.V.:1200.5; Blood:1028.6; IV Piggyback:733.7] Out: 7000 [Urine:6900; Drains:100] Intake/Output this shift: Total I/O In: -  Out: 450 [Urine:450]  Physical Exam: Abdomen - protuberant; cholecystostomy to drainage with bilious output; wound dry and intact; moderate tenderness across abdominal wall; no sign of hematoma  Lab Results:  Recent Labs    03/28/24 1509 03/29/24 0330  WBC 13.7* 12.8*  HGB 9.7* 10.4*  HCT 26.9* 29.2*  PLT 74* 87*   BMET Recent Labs    03/28/24 1602 03/29/24 0330  NA 137 137  K 3.9 4.0  CL 99 102  CO2 27 26  GLUCOSE 84 80  BUN 7 9  CREATININE 0.66 0.67   CALCIUM  6.9* 7.8*   PT/INR Recent Labs    03/28/24 0804 03/28/24 1133  LABPROT 15.3* 15.7*  INR 1.2 1.2   Comprehensive Metabolic Panel:    Component Value Date/Time   NA 137 03/29/2024 0330   NA 137 03/28/2024 1602   NA 140 01/18/2024 1105   K 4.0 03/29/2024 0330   K 3.9 03/28/2024 1602   CL 102 03/29/2024 0330   CL 99 03/28/2024 1602   CO2 26 03/29/2024 0330   CO2 27 03/28/2024 1602   BUN 9 03/29/2024 0330   BUN 7 03/28/2024 1602   BUN 4 (L) 01/18/2024 1105   CREATININE 0.67 03/29/2024 0330   CREATININE 0.66 03/28/2024 1602   GLUCOSE 80 03/29/2024 0330   GLUCOSE 84 03/28/2024 1602   CALCIUM  7.8 (L) 03/29/2024 0330   CALCIUM  6.9 (L) 03/28/2024 1602   AST 226 (H) 03/29/2024 0330   AST 341 (H) 03/28/2024 1602   ALT 262 (H) 03/29/2024 0330   ALT 276 (H) 03/28/2024 1602   ALKPHOS 93 03/29/2024 0330   ALKPHOS 84 03/28/2024 1602   BILITOT 2.3 (H) 03/29/2024 0330   BILITOT 3.4 (H) 03/28/2024 1602   BILITOT <0.2 01/18/2024 1105   PROT 5.6 (L) 03/29/2024 0330   PROT 5.6 (L) 03/28/2024 1602   PROT 6.3 01/18/2024 1105   ALBUMIN  3.3 (L) 03/29/2024 0330   ALBUMIN  3.6 03/28/2024 1602   ALBUMIN  3.5 (L) 01/18/2024 1105    Studies/Results: CT CHEST  ABDOMEN PELVIS W CONTRAST Result Date: 03/28/2024 CLINICAL DATA:  Status post emergent Caesarean section for gangrenous cholecystitis, ultimately with cholecystostomy tube placement at the time of surgery. Now with abdominal pain and concern for expanding hematoma. EXAM: CT CHEST, ABDOMEN, AND PELVIS WITH CONTRAST TECHNIQUE: Multidetector CT imaging of the chest, abdomen and pelvis was performed following the standard protocol during bolus administration of intravenous contrast. RADIATION DOSE REDUCTION: This exam was performed according to the departmental dose-optimization program which includes automated exposure control, adjustment of the mA and/or kV according to patient size and/or use of iterative reconstruction technique.  CONTRAST:  75mL OMNIPAQUE  IOHEXOL  350 MG/ML SOLN COMPARISON:  03/27/2024 FINDINGS: CT CHEST FINDINGS Cardiovascular: Heart size upper normal. No substantial pericardial effusion. No thoracic aortic aneurysm. Linear density along the lateral wall of the transverse aorta is favored to represent motion artifact given similar appearance in the adjacent pulmonary outflow tract. Mediastinum/Nodes: No mediastinal lymphadenopathy. There is no hilar lymphadenopathy. Fluid in the esophagus is likely secondary to reflux given the fluid distended stomach. There is no axillary lymphadenopathy. Lungs/Pleura: Dependent collapse/consolidative opacity is seen in both lungs with small to moderate right and small left pleural effusions. Musculoskeletal: No worrisome lytic or sclerotic osseous abnormality. CT ABDOMEN PELVIS FINDINGS Hepatobiliary: No suspicious focal abnormality within the liver parenchyma. Gallbladder is decompressed with marked diffuse gallbladder wall thickening and edema. The balloon from the cholecystostomy tube is identified in the region of the gallbladder fundus, perhaps best seen on sagittal image 45 of series 7. Intraluminal positioning of the balloon/catheter tip cannot be confirmed on this study. No intrahepatic or extrahepatic biliary dilation. Pancreas: No focal mass lesion. No dilatation of the main duct. No intraparenchymal cyst. No peripancreatic edema. Spleen: No splenomegaly. No suspicious focal mass lesion. Adrenals/Urinary Tract: No adrenal nodule or mass. Kidneys unremarkable. No evidence for hydroureter. Urinary bladder is decompressed by Foley catheter. Gas in the bladder lumen is compatible with the instrumentation. Urine in the bladder has high density compatible with excreted contrast from yesterday's CT scan. Stomach/Bowel: Stomach is distended with fluid. Duodenum is nondistended. There is fluid/edema around the duodenal bulb, descending and proximal transverse duodenum. Small bowel loops  are gas-filled and mildly distended up to about 2.5 cm diameter. Appendix is not well visualized. No evidence for colonic obstruction. Vascular/Lymphatic: No abdominal aortic aneurysm. No abdominal aortic atherosclerotic calcification. There is no gastrohepatic or hepatoduodenal ligament lymphadenopathy. No retroperitoneal or mesenteric lymphadenopathy. No pelvic sidewall lymphadenopathy. Reproductive: Enlarged hypervascular uterus compatible with the immediate postoperative state. Endometrial canal appears thickened. There is no adnexal mass. Other: Free fluid is identified around the inferior liver and in the right paracolic gutter fluid is also visible in the left paracolic gutter and in the pelvis. Density of the free fluid is minimally higher than would be expected for simple fluid in the gutters in density in the cul-de-sac is higher still, compatible with blood products. There appears to be a layering hematocrit level in the cul-de-sac, further compatible with small volume hemoperitoneum although infectious debris could have this appearance. Musculoskeletal: Left lower abdominal wall hematoma identified previously is minimally larger on the current exam measuring 6.0 x 4.5 cm (image 95/3) which compares to 4.8 x 3.4 cm previously. It is unclear on this study whether this hematoma is within or superficial to the rectus sheath. There is a small focus of extraluminal contrast puddling superficial to the hematoma (axial 98/series 3) compatible with persistent active extravasation at a location similar to yesterday's exam. On delayed imaging, extravasated  contrast layers dependently in the hematoma (see axial 59/series 8) compatible with ongoing bleeding/hemorrhage. Extensive edema/hemorrhage is seen in the low anterior abdominal wall, left greater than right with prominent soft tissue gas in this region, findings compatible with recent Caesarean section. IMPRESSION: 1. Left lower abdominal wall hematoma  identified previously is slightly larger on the current exam measuring 6.0 x 4.5 cm which compares to 4.8 x 3.4 cm previously. There is a small focus of extraluminal contrast puddling superficial to the hematoma compatible with persistent active extravasation at a location similar to yesterday's exam. On delayed imaging, extravasated contrast layers dependently in the hematoma compatible with ongoing bleeding/hemorrhage. 2. Free fluid in the abdomen and pelvis with density higher than would be expected for simple fluid in the gutters, compatible with blood products. There appears to be a layering hematocrit level in the cul-de-sac, further compatible with small volume hemoperitoneum. Volume of intraperitoneal free fluid is generally stable in the interval. 3. Gallbladder is decompressed with marked diffuse gallbladder wall thickening and edema. The balloon from the cholecystostomy tube is identified in the region of the gallbladder fundus, perhaps best seen on sagittal imaging. Intraluminal positioning of the balloon/catheter tip cannot be confirmed on this study. 4. Dependent collapse/consolidative opacity in both lungs with small to moderate right and small left pleural effusions. Imaging features could be compatible with atelectasis and/or pneumonia. 5. Fluid in the esophagus is likely secondary to reflux given the fluid distended stomach. 6. Enlarged hypervascular uterus compatible with the immediate postoperative state. Endometrial canal appears thickened. 7. Gas-filled and mildly distended small bowel loops up to about 2.5 cm diameter. Imaging features are compatible with ileus. Critical Value/emergent results were called by telephone at the time of interpretation on 03/28/2024 at 6:19 am to provider Keene Pastures , who verbally acknowledged these results. Electronically Signed   By: Donnal Fusi M.D.   On: 03/28/2024 06:20   CT ABDOMEN PELVIS W CONTRAST Result Date: 03/27/2024 CLINICAL DATA:  Cholelithiasis,  abdominal pain EXAM: CT ABDOMEN AND PELVIS WITH CONTRAST TECHNIQUE: Multidetector CT imaging of the abdomen and pelvis was performed using the standard protocol following bolus administration of intravenous contrast. RADIATION DOSE REDUCTION: This exam was performed according to the departmental dose-optimization program which includes automated exposure control, adjustment of the mA and/or kV according to patient size and/or use of iterative reconstruction technique. CONTRAST:  75mL OMNIPAQUE  IOHEXOL  350 MG/ML SOLN COMPARISON:  None Available. FINDINGS: Lower chest: Trace bilateral pleural effusions. Bilateral lower lobe dependent airspace opacities, likely atelectasis although pneumonia is not excluded. Hepatobiliary: Mild periportal edema. No biliary ductal dilatation. Portal vein is patent. Gallbladder wall is markedly thickened, measuring up to 1.8 cm. There appears to be gas within the decompressed collapsed lumen of the gallbladder. There appears to be a balloon retention cholecystostomy catheter in place. It is difficult to confirm that the balloon is in the lumen of the gallbladder and not within the gallbladder wall given the collapsed state of the gallbladder lumen. Pancreas: No focal abnormality or ductal dilatation. Spleen: No focal abnormality.  Normal size. Adrenals/Urinary Tract: No adrenal abnormality. No focal renal abnormality. No stones or hydronephrosis. Urinary bladder is unremarkable. Foley catheter in the in the bladder which is decompressed. Stomach/Bowel: Stomach, large and small bowel grossly unremarkable. Vascular/Lymphatic: No evidence of aneurysm or adenopathy. Reproductive: Enlarged postpartum uterus with changes of recent C-section. Stranding and gas throughout the anterior lower abdominal wall. Other: Small amount of free fluid in the abdomen and pelvis. Small amount of free  air adjacent to the liver. As noted above, changes of C-section in the anterior lower abdominal wall with  stranding and gas. Left abdominal wall complex fluid collection measuring 4.8 x 3.4 cm may reflect hematoma. High-density areas noted within this fluid collection which could reflect active extravasation of contrast. Musculoskeletal: No acute or significant osseous findings. IMPRESSION: Markedly thick walled gallbladder with collapse/decompressed lumen. Findings compatible with acute cholecystitis. Balloon retention cholecystostomy in place. It is difficult to confirm exact position as the balloon appears to be in the gallbladder wall rather than the lumen. Recommend clinical correlation with drainage. Mild free fluid in the abdomen and pelvis. Trace bilateral pleural effusions. Dependent bibasilar atelectasis or infiltrates. Mild periportal edema which can be seen with volume overload. Changes of recent C-section. 4.8 x 3.4 cm fluid collection in the left lower abdominal wall with areas of high density within and adjacent to the fluid collection. This could reflect areas of active extravasation/bleeding. Electronically Signed   By: Janeece Mechanic M.D.   On: 03/27/2024 18:05   US  Abdomen Limited RUQ (LIVER/GB) Addendum Date: 03/27/2024 ADDENDUM REPORT: 03/27/2024 11:02 ADDENDUM: The original report was by Dr. Freida Jes. The following addendum is by Dr. Freida Jes: These results were called by telephone at the time of interpretation on 03/27/2024 at 10:52 AM to provider Princess Brooks , who verbally acknowledged these results. Electronically Signed   By: Freida Jes M.D.   On: 03/27/2024 11:02   Result Date: 03/27/2024 CLINICAL DATA:  Abdominal pain.  Third trimester pregnancy. EXAM: ULTRASOUND ABDOMEN LIMITED RIGHT UPPER QUADRANT COMPARISON:  None Available. FINDINGS: Gallbladder: Abnormal appearance of the gallbladder with asymmetric wall thickening irregular and thick membranous internal contents which are nonshadowing. Sonographic Murphy sign present. No gallstones observed. Common bile duct:  Diameter: 0.2 cm Liver: No focal lesion identified. Within normal limits in parenchymal echogenicity. Portal vein is patent on color Doppler imaging with normal direction of blood flow towards the liver. Other: None. IMPRESSION: 1. Sonographic Abigail Abler sign present with asymmetric gallbladder wall thickening and membranous internal contents of the gallbladder. Appearance strongly favors acute cholecystitis and gangrenous cholecystitis is a distinct possibility. Urgent general surgical consultation recommended. Radiology assistant personnel have been notified to put me in telephone contact with the referring physician or the referring physician's clinical representative in order to discuss these findings. Once this communication is established I will issue an addendum to this report for documentation purposes. Electronically Signed: By: Freida Jes M.D. On: 03/27/2024 10:39   US  MFM OB LIMITED Result Date: 03/27/2024 ----------------------------------------------------------------------  OBSTETRICS REPORT                    (Corrected Final 03/27/2024 10:26 am) ---------------------------------------------------------------------- Patient Info  ID #:       [id number]                          D.O.B.:  December 22, 2000 (23 yrs)(F)  Name:       Toni Klein          Visit Date: 03/27/2024 09:29 am              M[place] ---------------------------------------------------------------------- Performed By  Attending:        Penney Bowling DO       Ref. Address:     [facility]  Performed By:     Carlene Che          Secondary Phy.:   [facility] MAU/Triage  RDMS  Referred By:      Mardee Shackle                 [facility] ---------------------------------------------------------------------- Orders  #  Description                           Code        Ordered By  1  US  MFM OB LIMITED                     76815.01    Princess Brooks  ----------------------------------------------------------------------  #  Order #                     Accession #                Episode #  1  [id number]                   [id number]                 [id number] ---------------------------------------------------------------------- Indications  Abdominal pain in pregnancy                    O99.89  [redacted] weeks gestation of pregnancy                Z3A.36 ---------------------------------------------------------------------- Fetal Evaluation  Num Of Fetuses:         1  Fetal Heart Rate(bpm):  153  Cardiac Activity:       Observed  Presentation:           Cephalic  Placenta:               Posterior  P. Cord Insertion:      Previously seen  Amniotic Fluid  AFI FV:      Within normal limits  AFI Sum(cm)     %Tile       Largest Pocket(cm)  12.9            45          4.7  RUQ(cm)       RLQ(cm)       LUQ(cm)        LLQ(cm)  4.7           3.4           3.3            1.5  Comment:    No placental abruption or previa identified. ---------------------------------------------------------------------- OB History  Gravidity:    6         Term:   1         SAB:   3  Ectopic:      1        Living:  1 ---------------------------------------------------------------------- Gestational Age  LMP:           36w 4d        Date:  07/15/23                  EDD:   04/20/24  Best:  36w 4d     Det. By:  LMP  (07/15/23)          EDD:   04/20/24 ---------------------------------------------------------------------- Anatomy  Diaphragm:             Appears normal         Kidneys:                Appear normal  Stomach:               Appears normal, left   Bladder:                Appears normal                         sided ---------------------------------------------------------------------- Cervix Uterus Adnexa  Cervix  Not visualized (advanced GA >24wks)  Uterus  No abnormality visualized.  Right Ovary  Within normal limits.  Left Ovary  Within normal limits.  Adnexa  No abnormality visualized  ---------------------------------------------------------------------- Comments  Hospital Ultrasound  The patient presented to the MAU for abdominal pain that  was sudden onset and woke the patient from sleep. Liver  enzymes are 2,157 and 1,214 with normla bilirubin and plt  (163k). BP is slightly elevated but the patient is in extreme  pain per the [facility] provider.  Sonographic findings  Single intrauterine pregnancy at 36w 4d  Fetal cardiac activity: Observed and appears normal.  Presentation: Cephalic.  Limited fetal anatomy appears normal.  Amniotic fluid: Within normal limits.  MVP: 4.7 cm.  Placenta: Posterior. There is no sonographic evidence of  bleeding but the placenta appears very calcified.  Recommendations  - See MFM plan of care  This was a limited ultrasound with a remote read. If an official  MFM consult is requested for any reason please call/place an  order in Epic. ----------------------------------------------------------------------                      Penney Bowling, DO Electronically Signed Corrected Final Report  03/27/2024 10:26 am ----------------------------------------------------------------------      Oralee Billow 03/29/2024  Patient ID: Cicero Crawley, female   DOB: Mar 04, 2001, 23 y.o.   MRN: [id number]

## 2024-03-29 NOTE — Lactation Note (Signed)
 This note was copied from a baby's chart.  NICU Lactation Consultation Note  Patient Name: Toni Klein Today's Date: 03/29/2024 Age:22 hours  Reason for consult: Follow-up assessment; NICU baby; Infant < 5lbs; Other (Comment); Late-preterm 34-36.6wks (severe Pre-E, HELLP, maternal ICU admission)  SUBJECTIVE LC in the room to visit with family; Toni Klein got transferred from the ICU to [facility] this afternoon. OBSC RN Isa Manuel B has already set up a DEBP in the room but patient told her RN she would like to rest today and F/U with lactation tomorrow. Provided Spanish brochures to [facility] RN to share with patient as this is her second day post-partum and pumping has not been initiated yet. OBSC RN aware of delayed onset of pumping and will pass on report to night shift to ask Toni Klein to see if she would like to start pumping later tonight; she's still very tired and sleepy. LC services to F/U tomorrow.   OBJECTIVE Infant data: Mother's Current Feeding Choice: -- (NPO)  O2 Device: HHFNC O2 Flow Rate (L/min): 2 L/min FiO2 (%): 21 %  Infant feeding assessment IDFTS - Readiness: 3   Maternal data: [id number] C-Section, Low Transverse Does the patient have breastfeeding experience prior to this delivery?: Yes How long did the patient breastfeed?: 6 moths, mostly pump and bottle feeding, she would occasionally take baby to breast Pumping frequency: Still has not initiated pumping at 53 hours post-partum due to maternal status Flange Size: 21 Hands-free pumping top sizes: Small/Medium (Blue)  WIC Program: Yes WIC Referral Sent?: Yes What county?: [place]  ASSESSMENT Infant: Feeding Status: NPO  Maternal: No data recorded  INTERVENTIONS/PLAN Interventions: Interventions: LC Services brochure; NICU Pumping Log; CDC Guidelines for Breast Pump Cleaning Tools: Pump; Flanges; Hands-free pumping top Pump Education: Setup, frequency, and cleaning; Milk  Storage  Plan: Consult Status: NICU follow-up NICU Follow-up type: New admission follow up   [employer] 03/29/2024, 6:25 PM

## 2024-03-29 NOTE — Progress Notes (Signed)
 NAME:  Toni Klein, MRN:  [id number], DOB:  08/05/2001, LOS: 2 ADMISSION DATE:  03/27/2024, CONSULTATION DATE:  03/29/24 REFERRING MD:  Shira Dopp, CHIEF COMPLAINT:  abdominal pain   History of Present Illness:  Ms. Toni Klein is a 23 y.o. F with PMH significant for ectopic pregnancy G6 P1-0-4-1  currently [redacted] weeks pregnant who presented on 5/8 with severe abdominal pain that began in the epigastric area and began radiating to the L side with associated nausea and vomiting.  Her initial work-up showed significantly elevated LFT's AST 2157 and ALT 1214 with bili 1.0. lipase WNL,. WBC 14k.  RUQ US  showed thickened GB with positive sonographic Murphy sign, no stones.  CBD confirmed patent and hepatitis panel negative, tylenol  level <10.  She was taken to the OR for cesarean section and likely cholecystectomy, she was extubated post-operatively and transferred to the ICU   Pertinent  Medical History   has a past medical history of Ectopic pregnancy.   Significant Hospital Events: Including procedures, antibiotic start and stop dates in addition to other pertinent events   5/8 presented with abdominal pain, elevated LFT's, C-section, ? Cholecystitis, ICU transfer after OR 5/9 improving LFTs.  Stabilizing anemia.  Interim History / Subjective:  Continues to have abdominal pain predominantly when moving. On room air, no vasopressors. Improving LFTs and cell counts.  Objective    Blood pressure 127/61, pulse 92, temperature 98 F (36.7 C), temperature source Oral, resp. rate (!) 26, weight 60.1 kg, last menstrual period 07/15/2023, SpO2 98%, unknown if currently breastfeeding.        Intake/Output Summary (Last 24 hours) at 03/29/2024 1530 Last data filed at 03/29/2024 1200 Gross per 24 hour  Intake 509.93 ml  Output 3700 ml  Net -3190.07 ml   Filed Weights   03/27/24 0815 03/27/24 1536  Weight: 58 kg 60.1 kg    General: Appears stated age.  No distress. HEENT: MM  pink/moist, sclera anicteric  Neuro: No focal neurological deficits.  Normoreflexic off magnesium . CV: Normal heart sounds.  No murmur.  Not tachycardic. PULM: Clear bilaterally. GI: Positive bowel sounds.  Right upper quadrant biliary drain in place.  Functioning.  No blood staining on dressing.  Lower abdominal incision clean.  Generalized tenderness but no rebound. Extremities: warm/dry, no edema  Skin: no rashes or lesions  Ancillary tests personally reviewed  Normal electrolytes.  Normal creatinine. AST ALT continue to improve.  Bilirubin continues to drop. Improving leukocytosis 12.8.  Hemoglobin improving 10.4, platelet count recovering 87. No schistocytes on smear.  Assessment & Plan:   Preeclampsia HELLP Syndrome Abdominal Wall Hematoma status post biliary drain placement for acute cholecystitis Acute blood loss anemia due to rectus sheath hematoma. Normal term pregnancy status post C-section for above.  Plan:  - Resume normal postpartum care. - Continue current multimodal pain control.  Likely will have significant pain with ambulation.  Patient advised as such. - Continue to follow CBC and LFTs to resolution. - Timing of cholecystectomy, duration of antibiotic therapy per general surgery. - Ready for transfer back to postpartum unit.  Discussed with on-call OB  Best Practice (right click and "Reselect all SmartList Selections" daily)   Diet/type: Regular consistency (see orders) DVT prophylaxis SCD Pressure ulcer(s): N/A GI prophylaxis: N/A Lines: N/A Foley: Removed. Code Status:  full code Last date of multidisciplinary goals of care discussion [pending]  Arlina Lair, MD [facility] ICU Physician [facility]  Critical Care  Pager: [phone] Or Epic Secure Chat After hours: [phone].  03/29/2024,  3:39 PM

## 2024-03-29 NOTE — Plan of Care (Signed)
  Problem: Clinical Measurements: Goal: Complications related to disease process, condition or treatment will be avoided or minimized Outcome: Progressing   Problem: Health Behavior/Discharge Planning: Goal: Ability to manage health-related needs will improve Outcome: Progressing   Problem: Clinical Measurements: Goal: Ability to maintain clinical measurements within normal limits will improve Outcome: Progressing Goal: Diagnostic test results will improve Outcome: Progressing Goal: Respiratory complications will improve Outcome: Progressing Goal: Cardiovascular complication will be avoided Outcome: Progressing

## 2024-03-29 NOTE — Progress Notes (Incomplete)
    Referring Provider(s): * No referring provider recorded for this case *  Supervising Physician: {Supervising Physician:[id number]}  Patient Status:  {IR Patient Status:21574}  Chief Complaint:  ***  Brief History:  ***  Subjective:  ***  Allergies: Patient has no known allergies.  Medications: Prior to Admission medications   Medication Sig Start Date End Date Taking? Authorizing Provider  aspirin  EC 81 MG tablet Take 1 tablet (81 mg total) by mouth daily. Swallow whole. 01/18/24  Yes Zelma Hidden, FNP  Prenatal Vit-Fe Fumarate-FA (MULTIVITAMIN-PRENATAL) 27-0.8 MG TABS tablet Take 1 tablet by mouth daily at 12 noon.   Yes [provider]     Vital Signs: BP 127/61   Pulse 92   Temp 98 F (36.7 C) (Oral)   Resp (!) 26   Wt 132 lb 7.9 oz (60.1 kg)   LMP 07/15/2023   SpO2 98%   Breastfeeding Unknown   BMI 25.03 kg/m   Physical Exam   Labs:  CBC: Recent Labs    03/28/24 0500 03/28/24 0632 03/28/24 1133 03/28/24 1509 03/29/24 0330  WBC 11.7*  --  15.8* 13.7* 12.8*  HGB 4.2*  --  10.0* 9.7* 10.4*  HCT 11.9*  --  27.2* 26.9* 29.2*  PLT 55* 56* 76*  79* 74* 87*    COAGS: Recent Labs    03/27/24 1548 03/27/24 2116 03/28/24 0632 03/28/24 0804 03/28/24 1133  INR 1.8* 1.5* 1.3* 1.2 1.2  APTT 36 30 32  --  32    BMP: Recent Labs    03/27/24 1904 03/28/24 0500 03/28/24 1602 03/29/24 0330  NA 136 132* 137 137  K 4.6 3.7 3.9 4.0  CL 106 96* 99 102  CO2 20* 26 27 26   GLUCOSE 144* 107* 84 80  BUN 9 8 7 9   CALCIUM  6.8* 6.6* 6.9* 7.8*  CREATININE 0.67 0.64 0.66 0.67  GFRNONAA >60 >60 >60 >60    LIVER FUNCTION TESTS: Recent Labs    03/27/24 1904 03/28/24 0500 03/28/24 1602 03/29/24 0330  BILITOT 8.8* 6.7* 3.4* 2.3*  AST 2,247* 662* 341* 226*  ALT 877* 384* 276* 262*  ALKPHOS 132* 82 84 93  PROT 4.5* 4.9* 5.6* 5.6*  ALBUMIN  2.2* 2.9* 3.6 3.3*    Assessment and Plan:  Severe transaminitis, acidemia, proteinuria, and  probable cholecystitis in late pregnancy concerning for HELLP with possible acute cholecystitis:   -   Thank you for allowing our service to participate in Toni Klein 's care.   Electronically Signed: Jhana Giarratano M Steward Sames, PA-C 03/29/2024, 11:46 AM    I spent a total of {Evaluation Minutes:[id number]} at the the patient's bedside AND on the patient's hospital floor or unit, greater than 50% of which was counseling/coordinating care for HELLP

## 2024-03-29 NOTE — Progress Notes (Signed)
 OB/GYN Faculty Attending Note  Post Op Day 2 S/p 1CS and open cholecystostomy tube placement 2/2 gangrenous gallbladder  Subjective: Patient is feeling nausea and pain, both improved with meds. Remains in ICU, remains in bed. Foley catheter in bladder, tolerating clears. Baby in NICU, she would like to breastfeed.   Objective: Blood pressure 127/61, pulse 92, temperature 98.9 F (37.2 C), temperature source Oral, resp. rate (!) 26, weight 60.1 kg, last menstrual period 07/15/2023, SpO2 98%, unknown if currently breastfeeding. Temp:  [98.1 F (36.7 C)-99.3 F (37.4 C)] 98.9 F (37.2 C) (05/10 0825) Pulse Rate:  [91-126] 92 (05/10 0800) Resp:  [0-30] 26 (05/10 0846) SpO2:  [89 %-100 %] 98 % (05/10 0846) Arterial Line BP: (110-159)/(69-89) 123/77 (05/10 0800)  Physical Exam:  General: alert, oriented, cooperative Chest: normal respiratory effort Heart: regular rate  Abdomen: soft, appropriately tender to palpation, particularly on left, right upper quadrant foley drain covered by dressing, incision covered by dressing with no evidence of active bleeding  Uterine Fundus: firm, at the level of the umbilicus Lochia: moderate, rubra DVT Evaluation: no evidence of DVT Extremities: no edema, no calf tenderness  UOP: >100 mL/hr clear yellow urine Biliary choley drain: 100 mL dark bilious fluid overnight  Recent Labs    03/28/24 1509 03/29/24 0330  HGB 9.7* 10.4*  HCT 26.9* 29.2*    Assessment/Plan: Patient Active Problem List   Diagnosis Date Noted   Pregnancy 03/27/2024   Elevated liver enzymes 03/27/2024   Abdominal pain, epigastric 03/27/2024   Hemolysis, elevated liver enzymes, and low platelet (HELLP) syndrome during pregnancy, antepartum 03/27/2024   HELLP syndrome, delivered, current hospitalization 03/27/2024   Language barrier affecting health care 03/13/2024   AFP 3.82, OSBR 1:33 01/29/2024   Short interval between pregnancies affecting pregnancy, antepartum  01/02/2024   Encounter for supervision of low-risk pregnancy, antepartum 11/19/2023   ANA positive 11/19/2023    Patient is 23 y.o. [id number] POD#2 s/p 1LTCS + open cholecystomy drain placemetn at [redacted]w[redacted]d for severe HELLP, gangrenous gallbladder, subsequent DIC requiring multiple PLT, pRBC, cryo, FFP. Labs improved today with AST/ALT down to 200s, platelets stable in 80s. She appears well, sitting up in bed, states feels much better. Pain is manageble, tolerating clears.    1) HELLP -s/p Mag x24 hours -daily labs, CMP improved today -BP stable   2) Heme - s/p FFP, cryo POD#0, pRBCs POD#1 - stable H/H today  - s/p IR consult regarding the hematoma, likely stable with unchanging H/H, remains tender in area. CT yesterday showed minimal expansion since prior CT.  - Platelets overall stable but will lag in resolution which is normal in s/o HELLP   3) GI: Cholecystitis -drain in place producing bilious fluid -IV Rocpehin -gen surgery, IR and GI on board and appreciate recs - advance diet per GS/GI   4) Pulm - Required increased O2 overnight, weaned am to 3-4 L, tolerating well  5) PP -will have lactation come to help her pump   Appreciate co-management with ICU team   K. Andi Kaufmann, MD, [facility] Attending Center for [facility] (Faculty Practice)  03/29/2024, 9:17 AM

## 2024-03-30 ENCOUNTER — Other Ambulatory Visit: Payer: Self-pay

## 2024-03-30 LAB — COMPREHENSIVE METABOLIC PANEL WITH GFR
ALT: 232 U/L — ABNORMAL HIGH (ref 0–44)
AST: 109 U/L — ABNORMAL HIGH (ref 15–41)
Albumin: 3.4 g/dL — ABNORMAL LOW (ref 3.5–5.0)
Alkaline Phosphatase: 118 U/L (ref 38–126)
Anion gap: 11 (ref 5–15)
BUN: 9 mg/dL (ref 6–20)
CO2: 23 mmol/L (ref 22–32)
Calcium: 9.4 mg/dL (ref 8.9–10.3)
Chloride: 100 mmol/L (ref 98–111)
Creatinine, Ser: 0.61 mg/dL (ref 0.44–1.00)
GFR, Estimated: >60 mL/min (ref >60)
Glucose, Bld: 74 mg/dL (ref 70–99)
Potassium: 3.9 mmol/L (ref 3.5–5.1)
Sodium: 134 mmol/L — ABNORMAL LOW (ref 135–145)
Total Bilirubin: 2.4 mg/dL — ABNORMAL HIGH (ref 0.0–1.2)
Total Protein: 6.2 g/dL — ABNORMAL LOW (ref 6.5–8.1)

## 2024-03-30 LAB — ALKALINE PHOSPHATASE, ISOENZYMES
Alk Phos Bone Fract: 51 % (ref 14–68)
Alk Phos Liver Fract: 41 % (ref 18–85)
Alk Phos: 282 IU/L — ABNORMAL HIGH (ref 44–121)
Intestinal %: 8 % (ref 0–18)

## 2024-03-30 LAB — HAPTOGLOBIN: Haptoglobin: <10 mg/dL — ABNORMAL LOW (ref 33–278)

## 2024-03-30 LAB — ADAMTS13 ACTIVITY: Adamts 13 Activity: 43.7 % — ABNORMAL LOW (ref >66.8)

## 2024-03-30 LAB — ADAMTS13 ACTIVITY REFLEX

## 2024-03-30 MED ORDER — NIFEDIPINE ER OSMOTIC RELEASE 30 MG PO TB24
30.0000 mg | ORAL_TABLET | Freq: Every day | ORAL | Status: DC
Start: 2024-03-30 — End: 2024-04-01
  Administered 2024-03-30 – 2024-04-01 (×3): 30 mg via ORAL
  Filled 2024-03-30 (×3): qty 1

## 2024-03-30 NOTE — Plan of Care (Signed)
  Problem: Education: Goal: Knowledge of disease or condition will improve Outcome: Progressing Goal: Knowledge of the prescribed therapeutic regimen will improve Outcome: Progressing   Problem: Fluid Volume: Goal: Peripheral tissue perfusion will improve Outcome: Progressing   Problem: Clinical Measurements: Goal: Complications related to disease process, condition or treatment will be avoided or minimized Outcome: Progressing   Problem: Education: Goal: Knowledge of General Education information will improve Description: Including pain rating scale, medication(s)/side effects and non-pharmacologic comfort measures Outcome: Progressing   Problem: Health Behavior/Discharge Planning: Goal: Ability to manage health-related needs will improve Outcome: Progressing   Problem: Clinical Measurements: Goal: Ability to maintain clinical measurements within normal limits will improve Outcome: Progressing Goal: Will remain free from infection Outcome: Progressing Goal: Diagnostic test results will improve Outcome: Progressing Goal: Respiratory complications will improve Outcome: Progressing Goal: Cardiovascular complication will be avoided Outcome: Progressing   Problem: Activity: Goal: Risk for activity intolerance will decrease Outcome: Progressing   Problem: Nutrition: Goal: Adequate nutrition will be maintained Outcome: Progressing   Problem: Coping: Goal: Level of anxiety will decrease Outcome: Progressing   Problem: Elimination: Goal: Will not experience complications related to bowel motility Outcome: Progressing Goal: Will not experience complications related to urinary retention Outcome: Progressing   Problem: Pain Managment: Goal: General experience of comfort will improve and/or be controlled Outcome: Progressing   Problem: Safety: Goal: Ability to remain free from injury will improve Outcome: Progressing   Problem: Skin Integrity: Goal: Risk for impaired  skin integrity will decrease Outcome: Progressing   Problem: Education: Goal: Knowledge of the prescribed therapeutic regimen will improve Outcome: Progressing Goal: Understanding of sexual limitations or changes related to disease process or condition will improve Outcome: Progressing Goal: Individualized Educational Video(s) Outcome: Progressing   Problem: Self-Concept: Goal: Communication of feelings regarding changes in body function or appearance will improve Outcome: Progressing   Problem: Skin Integrity: Goal: Demonstration of wound healing without infection will improve Outcome: Progressing   Problem: Education: Goal: Knowledge of condition will improve Outcome: Progressing Goal: Individualized Educational Video(s) Outcome: Progressing Goal: Individualized Newborn Educational Video(s) Outcome: Progressing   Problem: Activity: Goal: Will verbalize the importance of balancing activity with adequate rest periods Outcome: Progressing Goal: Ability to tolerate increased activity will improve Outcome: Progressing   Problem: Coping: Goal: Ability to identify and utilize available resources and services will improve Outcome: Progressing   Problem: Life Cycle: Goal: Chance of risk for complications during the postpartum period will decrease Outcome: Progressing   Problem: Role Relationship: Goal: Ability to demonstrate positive interaction with newborn will improve Outcome: Progressing   Problem: Skin Integrity: Goal: Demonstration of wound healing without infection will improve Outcome: Progressing

## 2024-03-30 NOTE — Progress Notes (Signed)
 Pt up to bathroom at this time, steady gait noted, voided lg amt without difficulty, peri care done, scant amt bleeding noted. Back to bed without incident. Pt states headache is worse when up moving, will medicate per order. Spanish translation used via pt's phone, plan of care reviewed and all questions answered.

## 2024-03-30 NOTE — Lactation Note (Signed)
 This note was copied from a baby's chart.  NICU Lactation Consultation Note  Patient Name: Toni Klein Today's Date: 03/30/2024 Age:22 hours  Reason for consult: Follow-up assessment; NICU baby; Other (Comment); Mother's request; RN request; Late-preterm 34-36.6wks (severe Pre-E, HELLP, maternal ICU admission)  SUBJECTIVE Visited with family of 2 60/83 weeks old AGA NICU female "Toni Klein; OBSC RN Mylinda Asa called out for lactation because Toni Klein was experiencing breast pain, she has already been given ice packs and initiated pumping last night; her milk is in already (see maternal assessment). Assisted with the fitting of a pumping band in size S/M for hands on pumping, washed all her pump pieces and showed her how to use the pump, she said milk kept spilling (colostrum containers were attached and caused overflow in tubing, colostrum containers and tubing were discarded). She voiced her breasts felt better after pumping. Provided additional storage bottles. Reviewed pumping schedule, pump settings, pumping log, lactogenesis II and anticipatory guidelines.  OBJECTIVE Infant data: Mother's Current Feeding Choice: -- (NPO)  O2 Device: HHFNC O2 Flow Rate (L/min): 2 L/min FiO2 (%): 21 %  Infant feeding assessment IDFTS - Readiness: 3   Maternal data: [id number] C-Section, Low Transverse Pumping frequency: initiated pumping at 58 hours post-partum due to ICU stay Pumped volume: 45 mL (45-60 ml)  WIC Program: Yes WIC Referral Sent?: Yes What county?: [place] Pump: Manual (Lansinoh hand pump)  ASSESSMENT Infant: Feeding Status: NPO  Maternal: Milk volume: Normal No S/S of engorgement at this time but breasts are full  INTERVENTIONS/PLAN Interventions: Interventions: Breast feeding basics reviewed; DEBP; Coconut oil; Education; Breast massage; LC Services brochure; CDC Guidelines for Breast Pump Cleaning; NICU Pumping Log; Ice Discharge Education:  Engorgement and breast care Tools: Coconut oil  Plan: Pump both breasts on maintain mode every 3 hours for 20-30 minutes; ideally 8 pumping sessions/24 hours Continue icing her breasts PRN Take all pump pieces to baby's room after her discharge from [facility]  FOB present and supportive. All questions and concerns answered, family to contact [facility] services PRN.  Consult Status: NICU follow-up NICU Follow-up type: Maternal D/C visit   Toni Klein Bare 03/30/2024, 12:23 PM

## 2024-03-30 NOTE — Progress Notes (Signed)
Assessment & Plan: Present on Admission:  Elevated liver enzymes  HELLP syndrome, delivered, current hospitalization   Cholecystitis in the setting of HELLP POD#3 - s/p open cholecystostomy tube placement at the time of C-section 03/27/24 - Dr. Aniceto Barley. - bilious output to drainage bag - LFT's improving - GI following   FEN - Reg  Cholecystostomy should remain on drainage as LFT's improve and clinical symdrome resolves.  Will need cholagiogram in IR prior to discharge with possible "capping" of the tube.  Cholecystostomy to remain in place approx 6 weeks before either removal or cholecystectomy.  Surgery will follow.        Toni Billow, MD [facility] practice Office: [phone]        Chief Complaint: HELLP syndrome  Subjective: Patient in bed, family at bedside.  More comfortable.  Objective: Vital signs in last 24 hours: Temp:  [98 F (36.7 C)-99.3 F (37.4 C)] 99.3 F (37.4 C) (05/11 0441) Pulse Rate:  [79-101] 101 (05/11 0441) Resp:  [13-26] 16 (05/11 0441) BP: (118-150)/(83-101) 141/97 (05/11 0441) SpO2:  [80 %-100 %] 94 % (05/11 0441) Arterial Line BP: (122-123)/(77) 122/77 (05/10 1100) Last BM Date :  (PTA)  Intake/Output from previous day: 05/10 0701 - 05/11 0700 In: 529.7 [P.O.:480; IV Piggyback:49.7] Out: 1050 [Urine:1050] Intake/Output this shift: No intake/output data recorded.  Physical Exam: Abdomen - less distended, still tender to palpation; RUQ drain to gravity with thin bilious output   Lab Results:  Recent Labs    03/28/24 1509 03/29/24 0330  WBC 13.7* 12.8*  HGB 9.7* 10.4*  HCT 26.9* 29.2*  PLT 74* 87*   BMET Recent Labs    03/29/24 0330 03/30/24 0425  NA 137 134*  K 4.0 3.9  CL 102 100  CO2 26 23  GLUCOSE 80 74  BUN 9 9  CREATININE 0.67 0.61  CALCIUM  7.8* 9.4   PT/INR Recent Labs    03/28/24 0804 03/28/24 1133  LABPROT 15.3* 15.7*  INR 1.2 1.2   Comprehensive Metabolic Panel:     Component Value Date/Time   NA 134 (L) 03/30/2024 0425   NA 137 03/29/2024 0330   NA 140 01/18/2024 1105   K 3.9 03/30/2024 0425   K 4.0 03/29/2024 0330   CL 100 03/30/2024 0425   CL 102 03/29/2024 0330   CO2 23 03/30/2024 0425   CO2 26 03/29/2024 0330   BUN 9 03/30/2024 0425   BUN 9 03/29/2024 0330   BUN 4 (L) 01/18/2024 1105   CREATININE 0.61 03/30/2024 0425   CREATININE 0.67 03/29/2024 0330   GLUCOSE 74 03/30/2024 0425   GLUCOSE 80 03/29/2024 0330   CALCIUM  9.4 03/30/2024 0425   CALCIUM  7.8 (L) 03/29/2024 0330   AST 109 (H) 03/30/2024 0425   AST 226 (H) 03/29/2024 0330   ALT 232 (H) 03/30/2024 0425   ALT 262 (H) 03/29/2024 0330   ALKPHOS 118 03/30/2024 0425   ALKPHOS 93 03/29/2024 0330   BILITOT 2.4 (H) 03/30/2024 0425   BILITOT 2.3 (H) 03/29/2024 0330   BILITOT <0.2 01/18/2024 1105   PROT 6.2 (L) 03/30/2024 0425   PROT 5.6 (L) 03/29/2024 0330   PROT 6.3 01/18/2024 1105   ALBUMIN  3.4 (L) 03/30/2024 0425   ALBUMIN  3.3 (L) 03/29/2024 0330   ALBUMIN  3.5 (L) 01/18/2024 1105    Studies/Results: No results found.    Toni Klein 03/30/2024  Patient ID: Toni Klein, female   DOB: 10-11-2001, 23 y.o.   MRN:  [id number]  

## 2024-03-30 NOTE — Progress Notes (Addendum)
 OB/GYN Faculty Attending Note  Post Op Day 3  Subjective: Patient is feeling better. She reports moderately well controlled pain on PO pain meds. She is ambulating and denies light-headedness or dizziness. She is voiding spontaneously. She is passing flatus, has not had a BM yet. She is tolerating a regular diet without nausea/vomiting. Bleeding is moderate. She is pumping. Baby is in NICU and doing well.  Objective: Blood pressure (!) 131/98, pulse 91, temperature 99.5 F (37.5 C), temperature source Oral, resp. rate 20, weight 60.1 kg, last menstrual period 07/15/2023, SpO2 96%, unknown if currently breastfeeding. Temp:  [98 F (36.7 C)-99.5 F (37.5 C)] 99.5 F (37.5 C) (05/11 0800) Pulse Rate:  [79-101] 91 (05/11 0800) Resp:  [13-23] 20 (05/11 0800) BP: (118-150)/(83-101) 131/98 (05/11 0800) SpO2:  [80 %-99 %] 96 % (05/11 1000)  Physical Exam:  General: alert, oriented, cooperative, appears very fatigued Chest: normal respiratory effort Heart: regular rate  Abdomen: soft, appropriately tender to palpation, particularly on left, right upper quadrant foley drain covered by dressing, incision covered by dressing with no evidence of active bleeding  Uterine Fundus: firm, below the umbilicus Lochia: minimal, rubra DVT Evaluation: no evidence of DVT Extremities: no edema, no calf tenderness  UOP: voiding spontaneously Biliary drain: scant dark liquid in bag  Recent Labs    03/28/24 1509 03/29/24 0330  HGB 9.7* 10.4*  HCT 26.9* 29.2*    Assessment/Plan: Patient Active Problem List   Diagnosis Date Noted   Pregnancy 03/27/2024   Elevated liver enzymes 03/27/2024   Abdominal pain, epigastric 03/27/2024   Hemolysis, elevated liver enzymes, and low platelet (HELLP) syndrome during pregnancy, antepartum 03/27/2024   HELLP syndrome, delivered, current hospitalization 03/27/2024   Language barrier affecting health care 03/13/2024   AFP 3.82, OSBR 1:33 01/29/2024   Short  interval between pregnancies affecting pregnancy, antepartum 01/02/2024   Encounter for supervision of low-risk pregnancy, antepartum 11/19/2023   ANA positive 11/19/2023    Patient is 23 y.o. [id number] POD#3 s/p 1LTCS + open cholecystomy drain placement at [redacted]w[redacted]d for severe HELLP, gangrenous gallbladder, subsequent DIC requiring multiple PLT, pRBC, cryo, FFP. Transferred to floor from ICU yesterday, labs continue to improve today.  She appears better and reports improved pain though requiring some O2 this am, improved with instruction in incentive spirometry. Has been out of bed.   1) HELLP -s/p Mag x24 hours -daily labs, CMP improved -BP increased, started on procardia today   2) Heme - s/p FFP, cryo POD#0, pRBCs POD#1 - s/p IR consult regarding the hematoma, likely stable with unchanging H/H, remains tender  3) GI: Cholecystitis -drain in place producing bilious fluid -IV Rocpehin -gen surgery, IR and GI on board and appreciate recs   4) Pulm - Required O2 this am but was able to wean off with incentive spirometer   5) PP -lactation involved -undecided for contraception   K. Andi Kaufmann, MD, [facility] Attending Center for [facility] Healthcare (Faculty Practice)  03/30/2024, 11:10 AM

## 2024-03-31 ENCOUNTER — Inpatient Hospital Stay (HOSPITAL_COMMUNITY): Payer: MEDICAID

## 2024-03-31 LAB — COMPREHENSIVE METABOLIC PANEL WITH GFR
ALT: 202 U/L — ABNORMAL HIGH (ref 0–44)
AST: 70 U/L — ABNORMAL HIGH (ref 15–41)
Albumin: 3.6 g/dL (ref 3.5–5.0)
Alkaline Phosphatase: 146 U/L — ABNORMAL HIGH (ref 38–126)
Anion gap: 12 (ref 5–15)
BUN: 12 mg/dL (ref 6–20)
CO2: 21 mmol/L — ABNORMAL LOW (ref 22–32)
Calcium: 10 mg/dL (ref 8.9–10.3)
Chloride: 105 mmol/L (ref 98–111)
Creatinine, Ser: 0.57 mg/dL (ref 0.44–1.00)
GFR, Estimated: >60 mL/min (ref >60)
Glucose, Bld: 83 mg/dL (ref 70–99)
Potassium: 3.8 mmol/L (ref 3.5–5.1)
Sodium: 138 mmol/L (ref 135–145)
Total Bilirubin: 2.1 mg/dL — ABNORMAL HIGH (ref 0.0–1.2)
Total Protein: 6.9 g/dL (ref 6.5–8.1)

## 2024-03-31 LAB — BPAM RBC
Blood Product Expiration Date: 202506082359
Blood Product Expiration Date: 202506082359
Blood Product Expiration Date: 202506082359
Blood Product Expiration Date: 202506082359
Blood Product Expiration Date: 202506082359
Blood Product Expiration Date: 202506082359
ISSUE DATE / TIME: 202505090453
ISSUE DATE / TIME: 202505090644
ISSUE DATE / TIME: 202505090758
ISSUE DATE / TIME: 202505090758
Unit Type and Rh: 6200
Unit Type and Rh: 6200
Unit Type and Rh: 6200
Unit Type and Rh: 6200
Unit Type and Rh: 6200
Unit Type and Rh: 6200

## 2024-03-31 LAB — TYPE AND SCREEN
ABO/RH(D): A POS
Antibody Screen: NEGATIVE
Unit division: 0
Unit division: 0
Unit division: 0
Unit division: 0
Unit division: 0
Unit division: 0

## 2024-03-31 LAB — CBC
HCT: 39.4 % (ref 36.0–46.0)
Hemoglobin: 13.6 g/dL (ref 12.0–15.0)
MCH: 30.5 pg (ref 26.0–34.0)
MCHC: 34.5 g/dL (ref 30.0–36.0)
MCV: 88.3 fL (ref 80.0–100.0)
Platelets: 216 10*3/uL (ref 150–400)
RBC: 4.46 MIL/uL (ref 3.87–5.11)
RDW: 14 % (ref 11.5–15.5)
WBC: 11.3 10*3/uL — ABNORMAL HIGH (ref 4.0–10.5)
nRBC: 0 % (ref 0.0–0.2)

## 2024-03-31 LAB — SURGICAL PATHOLOGY

## 2024-03-31 MED ORDER — IOHEXOL 300 MG/ML  SOLN
10.0000 mL | Freq: Once | INTRAMUSCULAR | Status: AC | PRN
  Administered 2024-03-31: 10 mL

## 2024-03-31 NOTE — Social Work (Signed)
 In-person interpreter unavailable therefore CSW used virtual interpreter [employer] [phone]). CSW initiated assessment with MOB but stopped due to MOB having to leave the room (1S05) for a procedure. CSW will attempt to see MOB at a later time. Nickolas Barr, MSW, LCSW Clinical Social Worker  [phone] 03-23-24  12:33 PM

## 2024-03-31 NOTE — Lactation Note (Signed)
 This note was copied from a baby's chart. Lactation Consultation Note  Patient Name: Toni Klein Today's Date: 03/31/2024 Age:22 days Reason for consult: Follow-up assessment;NICU baby;Late-preterm 34-36.6wks  P2, 37 wks, @ 4 days. Shelagh Derrick [facility] in-person spanish interpreter used. Mom has many bottles in fridge of pumped milk. Mom is using coconut oil with pumping- @ bedside- encouraged. Mom has concerns of a tender edge of a nipple hole, verbalizes concern of "pus". Discussed what a bleb of trapped milk would look like or symptoms of infection would look like. Mom denies additional symptoms. Encouraged EBM to nipple post use for nipple health as a preventative. Mom feels she will be DC tomorrow. Encouraged bringing supplies with her once DC complete. Encouraged mom in moving milk every 3 hours, hand pump from pump part kits shown to mom. Highlighted risk of engorgement. Discussed hand pump/express to soften breasts, motrin  as anti-inflammatory, and ice packs for 10-20 minutes post feed/pumping if still over-full is the best treatments for inflamed/engorged breasts. Mom has been working through these steps in hospital yesterday and today.   Plan: Pump both breasts on maintain mode every 3 hours for 20-30 minutes; ideally 8 pumping sessions/24 hours Continue icing her breasts PRN Take all pump pieces to baby's room after her discharge from [facility]   Feeding Mother's Current Feeding Choice: Breast Milk Nipple Type: Nfant Extra Slow Flow (gold)   Lactation Tools Discussed/Used Flange Size: 21 Breast pump type: Double-Electric Breast Pump Pump Education: Milk Storage Reason for Pumping: Breast stimulation, movement of milk Pumping frequency: Every 3 hrs Pumped volume: 60 mL  Interventions Interventions: DEBP;Hand pump;Ice;Education;LC Services brochure;CDC milk storage guidelines  Discharge Discharge Education: Engorgement and breast care Pump: Manual;Personal WIC  Program: Yes  Consult Status Consult Status: Follow-up Date: 03/31/24 Follow-up type: In-patient    Thula Stewart 03/31/2024, 4:15 PM

## 2024-03-31 NOTE — Progress Notes (Addendum)
 4 Days Post-Op   Subjective/Chief Complaint: Doing well, tol diet   Objective: Vital signs in last 24 hours: Temp:  [98 F (36.7 C)-99.1 F (37.3 C)] 98 F (36.7 C) (05/12 0730) Pulse Rate:  [75-105] 75 (05/12 0730) Resp:  [16-18] 18 (05/12 0730) BP: (115-151)/(79-105) 131/89 (05/12 0730) SpO2:  [93 %-97 %] 96 % (05/12 0442) Last BM Date : 03/27/24  Intake/Output from previous day: 05/11 0701 - 05/12 0700 In: 149.9 [IV Piggyback:149.9] Out: 75 [Drains:75] Intake/Output this shift: No intake/output data recorded.  Ab soft approp tender perc chole with minimal drainage  Lab Results:  Recent Labs    03/29/24 0330 03/31/24 0437  WBC 12.8* 11.3*  HGB 10.4* 13.6  HCT 29.2* 39.4  PLT 87* 216   BMET Recent Labs    03/30/24 0425 03/31/24 0437  NA 134* 138  K 3.9 3.8  CL 100 105  CO2 23 21*  GLUCOSE 74 83  BUN 9 12  CREATININE 0.61 0.57  CALCIUM  9.4 10.0   PT/INR Recent Labs    03/28/24 0804 03/28/24 1133  LABPROT 15.3* 15.7*  INR 1.2 1.2   ABG No results for input(s): "PHART", "HCO3" in the last 72 hours.  Invalid input(s): "PCO2", "PO2"  Studies/Results: No results found.  Anti-infectives: Anti-infectives (From admission, onward)    Start     Dose/Rate Route Frequency Ordered Stop   03/28/24 0600  cefTRIAXone  (ROCEPHIN ) 2 g in sodium chloride  0.9 % 100 mL IVPB        2 g 200 mL/hr over 30 Minutes Intravenous On call to O.R. 03/27/24 1248 03/27/24 1537   03/27/24 1517  piperacillin -tazobactam (ZOSYN ) IVPB 3.375 g        3.375 g 12.5 mL/hr over 240 Minutes Intravenous Every 8 hours 03/27/24 1517         Assessment/Plan: HELLP syndrome S/p perc chole for possible gb disease (no stones) -regular diet -will do tube cholangiogram today and if patent will cap tube -tube to remain six weeks prior to removal   Enid Harry 03/31/2024  Addendum: biliary system looks patent on tube cholangiogram.  Will cap tube.

## 2024-03-31 NOTE — Progress Notes (Signed)
 Subjective: Postpartum Day 4: Cesarean Delivery Patient reports incisional pain, tolerating PO, + flatus, and no problems voiding.    Objective: Vital signs in last 24 hours: Temp:  [98 F (36.7 C)-99.1 F (37.3 C)] (P) 98 F (36.7 C) (05/12 1125) Pulse Rate:  [75-105] 81 (05/12 1125) Resp:  [16-18] (P) 18 (05/12 1125) BP: (115-131)/(79-92) 123/85 (05/12 1125) SpO2:  [93 %-97 %] 96 % (05/12 0442)  Intake/Output Summary (Last 24 hours) at 03/31/2024 1137 Last data filed at 03/30/2024 2130 Gross per 24 hour  Intake 149.93 ml  Output 75 ml  Net 74.93 ml    Physical Exam:  General: alert, cooperative, and appears stated age 23: appropriate Uterine Fundus: firm Incision: healing well DVT Evaluation: No evidence of DVT seen on physical exam.  Results for orders placed or performed during the hospital encounter of 03/27/24 (from the past 24 hours)  Comprehensive metabolic panel with GFR     Status: Abnormal   Collection Time: 03/31/24  4:37 AM  Result Value Ref Range   Sodium 138 135 - 145 mmol/L   Potassium 3.8 3.5 - 5.1 mmol/L   Chloride 105 98 - 111 mmol/L   CO2 21 (L) 22 - 32 mmol/L   Glucose, Bld 83 70 - 99 mg/dL   BUN 12 6 - 20 mg/dL   Creatinine, Ser 3.47 0.44 - 1.00 mg/dL   Calcium  10.0 8.9 - 10.3 mg/dL   Total Protein 6.9 6.5 - 8.1 g/dL   Albumin  3.6 3.5 - 5.0 g/dL   AST 70 (H) 15 - 41 U/L   ALT 202 (H) 0 - 44 U/L   Alkaline Phosphatase 146 (H) 38 - 126 U/L   Total Bilirubin 2.1 (H) 0.0 - 1.2 mg/dL   GFR, Estimated >42 >59 mL/min   Anion gap 12 5 - 15  CBC     Status: Abnormal   Collection Time: 03/31/24  4:37 AM  Result Value Ref Range   WBC 11.3 (H) 4.0 - 10.5 K/uL   RBC 4.46 3.87 - 5.11 MIL/uL   Hemoglobin 13.6 12.0 - 15.0 g/dL   HCT 56.3 87.5 - 64.3 %   MCV 88.3 80.0 - 100.0 fL   MCH 30.5 26.0 - 34.0 pg   MCHC 34.5 30.0 - 36.0 g/dL   RDW 32.9 51.8 - 84.1 %   Platelets 216 150 - 400 K/uL   nRBC 0.0 0.0 - 0.2 %     Assessment/Plan: Status post  Cesarean section. Doing well postoperatively.  Breast-feeding Baby is in the NICU Patient will have a Nexplanon placed for postpartum contraception at [facility] HELLP syndrome Labs continue to slowly improve Patient is status post magnesium  sulfate x 24 hours Patient has had GI consult with investigation of TTP which was negative as well as autoimmune/viral/toxin etiologies. On Procardia to help with blood pressure management Rectus sheath hematoma Status post IR with no treatment done Hemoglobin is stable DIC  Has recovered. She is status post FFP, cryo, blood transfusions Status post cholecystotomy for gallbladder disease She has a drain in.  Discussed with general surgery will DC antibiotics today To go down for cholangiogram today and then try to The tube. The plan is for this to be staying for approximately 6 weeks to form a fistula and possibly avoid surgery.   Granville Layer, MD 03/31/2024, 11:34 AM

## 2024-04-01 ENCOUNTER — Other Ambulatory Visit (HOSPITAL_COMMUNITY): Payer: Self-pay

## 2024-04-01 LAB — CULTURE, BLOOD (ROUTINE X 2)
Culture: NO GROWTH
Culture: NO GROWTH

## 2024-04-01 LAB — COMPREHENSIVE METABOLIC PANEL WITH GFR
ALT: 157 U/L — ABNORMAL HIGH (ref 0–44)
AST: 52 U/L — ABNORMAL HIGH (ref 15–41)
Albumin: 3.6 g/dL (ref 3.5–5.0)
Alkaline Phosphatase: 154 U/L — ABNORMAL HIGH (ref 38–126)
Anion gap: 12 (ref 5–15)
BUN: 12 mg/dL (ref 6–20)
CO2: 19 mmol/L — ABNORMAL LOW (ref 22–32)
Calcium: 9.2 mg/dL (ref 8.9–10.3)
Chloride: 105 mmol/L (ref 98–111)
Creatinine, Ser: 0.48 mg/dL (ref 0.44–1.00)
GFR, Estimated: >60 mL/min (ref >60)
Glucose, Bld: 84 mg/dL (ref 70–99)
Potassium: 3.7 mmol/L (ref 3.5–5.1)
Sodium: 136 mmol/L (ref 135–145)
Total Bilirubin: 2.1 mg/dL — ABNORMAL HIGH (ref 0.0–1.2)
Total Protein: 6.8 g/dL (ref 6.5–8.1)

## 2024-04-01 MED ORDER — NIFEDIPINE ER 30 MG PO TB24
30.0000 mg | ORAL_TABLET | Freq: Every day | ORAL | 1 refills | Status: DC
  Filled 2024-04-01: qty 30, 30d supply, fill #0

## 2024-04-01 MED ORDER — IBUPROFEN 600 MG PO TABS: 600.0000 mg | ORAL_TABLET | Freq: Four times a day (QID) | ORAL | 1 refills | Status: DC

## 2024-04-01 MED ORDER — GABAPENTIN 100 MG PO CAPS: 200.0000 mg | ORAL_CAPSULE | Freq: Two times a day (BID) | ORAL | 1 refills | Status: DC

## 2024-04-01 MED ORDER — ACETAMINOPHEN 325 MG PO TABS: 650.0000 mg | ORAL_TABLET | Freq: Four times a day (QID) | ORAL | 2 refills | Status: DC

## 2024-04-01 MED ORDER — ACETAMINOPHEN 325 MG PO TABS
650.0000 mg | ORAL_TABLET | Freq: Four times a day (QID) | ORAL | Status: DC
  Administered 2024-04-01: 650 mg via ORAL
  Filled 2024-04-01: qty 2

## 2024-04-01 MED ORDER — CYCLOBENZAPRINE HCL 5 MG PO TABS: 5.0000 mg | ORAL_TABLET | Freq: Three times a day (TID) | ORAL | 0 refills | Status: DC

## 2024-04-01 MED ORDER — METHOCARBAMOL 500 MG PO TABS
500.0000 mg | ORAL_TABLET | Freq: Four times a day (QID) | ORAL | Status: DC
  Filled 2024-04-01 (×2): qty 1

## 2024-04-01 MED ORDER — CYCLOBENZAPRINE HCL 10 MG PO TABS
5.0000 mg | ORAL_TABLET | Freq: Three times a day (TID) | ORAL | Status: DC
  Administered 2024-04-01: 5 mg via ORAL
  Filled 2024-04-01: qty 1

## 2024-04-01 MED ORDER — FUROSEMIDE 20 MG PO TABS
20.0000 mg | ORAL_TABLET | Freq: Every day | ORAL | 0 refills | Status: DC
  Filled 2024-04-01: qty 5, 5d supply, fill #0

## 2024-04-01 MED ORDER — OXYCODONE HCL 5 MG PO TABS
5.0000 mg | ORAL_TABLET | ORAL | 0 refills | Status: DC | PRN
  Filled 2024-04-01: qty 20, 4d supply, fill #0

## 2024-04-01 NOTE — Progress Notes (Signed)
Pt discharged home in stable condition after discharge instructions given. Pt verbalized understanding and all questions were answered. IV was discontinued and pt was sent home with all belongings.

## 2024-04-01 NOTE — Lactation Note (Signed)
 This note was copied from a baby's chart.  NICU Lactation Consultation Note  Patient Name: Toni Klein Today's Date: 04/01/2024 Age:23 years  Reason for consult: Follow-up assessment; NICU baby; Late-preterm 34-36.6wks; Infant < 5lbs; Nipple pain/trauma; Other (Comment) (HELLP Syndrome) Maternal cholecystitis  SUBJECTIVE  [facility] hospital interpreter "Antony Baumgartner" assisting.  LC in to visit with P2 Mom of baby "Toni Klein" a LPT baby in the NICU.  Mom has sore pain from an abrasion to her left nipple.  Mom had been icing her breasts when LC entered the room.  With permission, LC assessed both breasts.  Breasts soft and compressible and left nipple looking healed.  Mom states that sometimes it hurts after pumping.  Mom has been consistently pumping and expressing 60 ml per session.  Due to continued tenderness with EBM to nipple and coconut oil, LC provided and placed a Comfort Gel on Mom's left areola and nipple.  Mom to hold off on icing her breasts unless she has engorgement/pain.  Mom continues with abdominal pain and not sure about discharge today.  Mom has a manual pump for home.  WIC called her yesterday.  She will be able to go to [facility] on discharge, but unsure if she can handle the trip there.  LC offered a [facility] loaner, explaining the process of refundable deposit of $30 (cash).  Mom nodded.  Mom asked to let her RN know on discharge if she would like the loaner pump.    Mom would like to offer the breast when she is feeling better.  Baby is taking bottles now.   OBJECTIVE Infant data: Mother's Current Feeding Choice: Breast Milk  O2 Device: HHFNC O2 Flow Rate (L/min): 2 L/min FiO2 (%): 21 %  Infant feeding assessment IDFTS - Readiness: 2 IDFTS - Quality: 4 (difficulty with latch, improved from previous feeding)   Maternal data: [id number] C-Section, Low Transverse Previous breastfeeding challenges?: Infant separation; Exclusive pump and bottle fed Pumping frequency: 8  times per 24 hrs Pumped volume: 60 mL Flange Size: 21 Hands-free pumping top sizes: Small/Medium (Blue)  WIC Program: Yes WIC Referral Sent?: Yes What county?: [place] Pump: Manual (Mom was offered a [facility] loaner pump on discharge, RN will call)  ASSESSMENT Infant:  Feeding Status: Scheduled 8-11-2-5 Feeding method: Bottle; Tube/Gavage (Bolus) Nipple Type: Dr. Leticia Raven Preemie  Maternal: Milk volume: Normal  INTERVENTIONS/PLAN Interventions: Interventions: Skin to skin; Breast massage; Hand express; Comfort gels; DEBP; Education; Hand pump Discharge Education: Engorgement and breast care Tools: Pump; Flanges; Comfort gels; Hands-free pumping top; Coconut oil Pump Education: Milk Storage  Plan: Consult Status: NICU follow-up NICU Follow-up type: Maternal D/C visit   [name] 04/01/2024, 10:26 AM

## 2024-04-01 NOTE — Plan of Care (Signed)
  Problem: Education: Goal: Knowledge of disease or condition will improve Outcome: Progressing Goal: Knowledge of the prescribed therapeutic regimen will improve Outcome: Progressing   Problem: Fluid Volume: Goal: Peripheral tissue perfusion will improve Outcome: Progressing   Problem: Clinical Measurements: Goal: Complications related to disease process, condition or treatment will be avoided or minimized Outcome: Progressing   Problem: Education: Goal: Knowledge of General Education information will improve Description: Including pain rating scale, medication(s)/side effects and non-pharmacologic comfort measures Outcome: Progressing   Problem: Clinical Measurements: Goal: Will remain free from infection Outcome: Progressing Goal: Diagnostic test results will improve Outcome: Progressing   Problem: Activity: Goal: Risk for activity intolerance will decrease Outcome: Progressing   Problem: Nutrition: Goal: Adequate nutrition will be maintained Outcome: Progressing   Problem: Coping: Goal: Level of anxiety will decrease Outcome: Progressing   Problem: Pain Managment: Goal: General experience of comfort will improve and/or be controlled Outcome: Progressing   Problem: Education: Goal: Knowledge of the prescribed therapeutic regimen will improve Outcome: Progressing   Problem: Skin Integrity: Goal: Demonstration of wound healing without infection will improve Outcome: Progressing   Problem: Education: Goal: Knowledge of condition will improve Outcome: Progressing   Problem: Activity: Goal: Will verbalize the importance of balancing activity with adequate rest periods Outcome: Progressing Goal: Ability to tolerate increased activity will improve Outcome: Progressing

## 2024-04-01 NOTE — Progress Notes (Addendum)
 5 Days Post-Op   Subjective/Chief Complaint: Doing well, reports pain in her abdomen with mobility that is temporarily relieved by rest/medication. Reports increased pain yesterday afternoon that is slightly better this AM.  +Flatus and BM. Voiding without reported sxs. We discussed her cholecystostomy tube and future management.    Objective: Vital signs in last 24 hours: Temp:  [98 F (36.7 C)-98.9 F (37.2 C)] 98.5 F (36.9 C) (05/13 0409) Pulse Rate:  [81-116] 105 (05/13 0409) Resp:  [17-18] 18 (05/13 0409) BP: (122-125)/(70-92) 122/84 (05/13 0409) SpO2:  [96 %-99 %] 99 % (05/12 2014) Last BM Date : 03/27/24  Intake/Output from previous day: 05/12 0701 - 05/13 0700 In: -  Out: 125 [Drains:125] Intake/Output this shift: No intake/output data recorded.  Ab soft approp tender perc chole with minimal drainage; hematoma of left flank noted.  Lab Results:  Recent Labs    03/31/24 0437  WBC 11.3*  HGB 13.6  HCT 39.4  PLT 216   BMET Recent Labs    03/31/24 0437 04/01/24 0452  NA 138 136  K 3.8 3.7  CL 105 105  CO2 21* 19*  GLUCOSE 83 84  BUN 12 12  CREATININE 0.57 0.48  CALCIUM  10.0 9.2   PT/INR No results for input(s): "LABPROT", "INR" in the last 72 hours.  ABG No results for input(s): "PHART", "HCO3" in the last 72 hours.  Invalid input(s): "PCO2", "PO2"  Studies/Results: DG CHOLANGIOGRAM  EXISTING TUBE Result Date: 03/31/2024 CLINICAL DATA:  23 year old female with HELLP syndrome status post emergency cesarean section and placement of gallbladder drain during surgery. EXAM: CATHETER CHOLANGIOGRAM COMPARISON:  None Available. CONTRAST:  10 mL - administered via the existing percutaneous drain. FLUOROSCOPY: 5.7 mGy TECHNIQUE: The patient was positioned supine on the fluoroscopy table. A preprocedural spot fluoroscopic image was obtained of the abdomen and the existing percutaneous drainage catheter. Multiple spot fluoroscopic and radiographic images were  obtained following the injection of a small amount of contrast via the existing percutaneous drainage catheter. FINDINGS: Cystic duct and common bile duct appear patent with contrast seen in the duodenum. IMPRESSION: Patent cystic duct and biliary tree. Performed By Lorinda Root, PA-C Electronically Signed   By: Melven Stable.  Shick M.D.   On: 03/31/2024 14:42    Anti-infectives: Anti-infectives (From admission, onward)    Start     Dose/Rate Route Frequency Ordered Stop   03/28/24 0600  cefTRIAXone  (ROCEPHIN ) 2 g in sodium chloride  0.9 % 100 mL IVPB        2 g 200 mL/hr over 30 Minutes Intravenous On call to O.R. 03/27/24 1248 03/27/24 1537   03/27/24 1517  piperacillin -tazobactam (ZOSYN ) IVPB 3.375 g  Status:  Discontinued        3.375 g 12.5 mL/hr over 240 Minutes Intravenous Every 8 hours 03/27/24 1517 03/31/24 1105       Assessment/Plan: HELLP syndrome S/p perc chole for possible gb disease (no stones) -regular diet -tube cholagiogram 5/12 shows patent cystic duct - cap tube today, this was discussed with RN. -tube to remain six weeks prior to removal - patient reported increased abdominal discomfort, worse with movement, consistent with incision pain. I scheduled her tylenol  and added flexeril  for post-operative pain control.  - stable for discharge from [facility] standpoint if pain controlled and cholecystostomy tube capped   Charlott Converse 04/01/2024  Addendum: biliary system looks patent on tube cholangiogram.  Will cap tube.

## 2024-04-01 NOTE — Discharge Summary (Addendum)
 Postpartum Discharge Summary      Patient Name: Toni Klein DOB: 03/06/2001 MRN: [id number]  Date of admission: 03/27/2024 Delivery date:03/27/2024 Delivering provider: Rik Chasten Date of discharge: 04/01/2024  Admitting diagnosis: Pregnancy [Z34.90] HELLP syndrome, delivered, current hospitalization [O14.24] Intrauterine pregnancy: [redacted]w[redacted]d     Secondary diagnosis:  Principal Problem:   Pregnancy Active Problems:   Encounter for supervision of low-risk pregnancy, antepartum   Short interval between pregnancies affecting pregnancy, antepartum   Language barrier affecting health care   Elevated liver enzymes   Abdominal pain, epigastric   Hemolysis, elevated liver enzymes, and low platelet (HELLP) syndrome during pregnancy, antepartum   HELLP syndrome, delivered, current hospitalization  Additional problems: Acute cholecystitis    Discharge diagnosis: Preterm Pregnancy Delivered, Preeclampsia (severe), Anemia, and HELLP syndrome, acute cholecystitis status post cystotomy with drain left in place, abdominal wall hematoma, acute blood loss anemia, hemolysis, fulminant DIC                                              Post partum procedures:blood transfusion and transferred to the ICU, IR evaluation for hematoma, magnesium  sulfate x 24 hours, cholangiogram Augmentation: N/A Complications: None  Hospital course: Sceduled C/S   23 y.o. yo [id number] at [redacted]w[redacted]d was admitted to the hospital 03/27/2024 after arrival to the ER with right upper quadrant pain.  She was found to be in fulminant H.E.L.P., DSC, and to have a gangrenous gallbladder per radiology.  GI consult, general surgery consulted critical care medicine consult were obtained.  Decision was made to proceed with primary C-section to not wait for her medical condition to worsen prior to delivery.  The initial plan was to do a C-section followed by cholecystectomy.  At time of C-section her new surgeon opted to do a  cystotomy only and leave a drain in place. .Delivery details are as follows:  Membrane Rupture Time/Date: 12:42 PM,03/27/2024  Delivery Method:C-Section, Low Transverse Details of operation can be found in separate operative notes by both the obstetrician and the general surgeon..  Patient had a postpartum course complicated by hemoglobin changed to 4.4 requiring 4 units of packed red blood cells.  Fulminant DIC with low fibrinogen  high D-dimer and abnormal coags.  The patient was given cryo plus FFP.  Evaluation for rectal sheath hematoma was done and patient was taken to IR where she was evaluated but no vessel could be found.  The patient spent the first 24 hours postpartum in the ICU where she had rapid normalization of her liver function testing.  Her platelets recovered and her hemoglobin stabilized.  Patient was then transferred back to the high risk postpartum unit.  She was started on Procardia for blood pressure control.  Her general surgeon did a cholangiogram on postoperative day #4 to ensure good placement and has recommended her drain be left in place for approximately 6 weeks.  She is ambulating, tolerating a regular diet, passing flatus, and urinating well. Patient is discharged home in stable condition on  04/01/24        Newborn Data: Birth date:03/27/2024 Birth time:12:43 PM Gender:Female Living status:Living Apgars:3 ,7  Weight:2180 g    Magnesium  Sulfate received: Yes: Seizure prophylaxis BMZ received: No Rhophylac:N/A MMR:N/A T-DaP:Declined Flu: N/A RSV Vaccine received: No Transfusion:Yes  Immunizations received: There is no immunization history for the selected administration types on file  for this patient.  Physical exam  Vitals:   03/31/24 2014 03/31/24 2346 04/01/24 0409 04/01/24 0834  BP: (!) 125/92 124/88 122/84 126/83  Pulse: 97 (!) 116 (!) 105 (!) 115  Resp: 18 17 18 18   Temp: 98.4 F (36.9 C) 98.9 F (37.2 C) 98.5 F (36.9 C) 98.6 F (37 C)  TempSrc:  Oral Oral Oral Oral  SpO2: 99%   99%  Weight:       General: alert, cooperative, and no distress Lochia: appropriate Uterine Fundus: firm Incision: Healing well with no significant drainage DVT Evaluation: No evidence of DVT seen on physical exam. Labs: Lab Results  Component Value Date   WBC 11.3 (H) 03/31/2024   HGB 13.6 03/31/2024   HCT 39.4 03/31/2024   MCV 88.3 03/31/2024   PLT 216 03/31/2024      Latest Ref Rng & Units 04/01/2024    4:52 AM  CMP  Glucose 70 - 99 mg/dL 84   BUN 6 - 20 mg/dL 12   Creatinine 7.32 - 1.00 mg/dL 2.02   Sodium 542 - 706 mmol/L 136   Potassium 3.5 - 5.1 mmol/L 3.7   Chloride 98 - 111 mmol/L 105   CO2 22 - 32 mmol/L 19   Calcium  8.9 - 10.3 mg/dL 9.2   Total Protein 6.5 - 8.1 g/dL 6.8   Total Bilirubin 0.0 - 1.2 mg/dL 2.1   Alkaline Phos 38 - 126 U/L 154   AST 15 - 41 U/L 52   ALT 0 - 44 U/L 157    Edinburgh Score:    03/30/2024    9:00 AM  Edinburgh Postnatal Depression Scale Screening Tool  I have been able to laugh and see the funny side of things. 0  I have looked forward with enjoyment to things. 0  I have blamed myself unnecessarily when things went wrong. 0  I have been anxious or worried for no good reason. 1  I have felt scared or panicky for no good reason. 0  Things have been getting on top of me. 1  I have been so unhappy that I have had difficulty sleeping. 0  I have felt sad or miserable. 0  I have been so unhappy that I have been crying. 0  The thought of harming myself has occurred to me. 0  Edinburgh Postnatal Depression Scale Total 2   No data recorded  After visit meds:  Allergies as of 04/01/2024   No Known Allergies      Medication List     TAKE these medications    acetaminophen  325 MG tablet Commonly known as: Tylenol  Take 2 tablets (650 mg total) by mouth every 6 (six) hours.   aspirin  EC 81 MG tablet Take 1 tablet (81 mg total) by mouth daily. Swallow whole.   cyclobenzaprine  5 MG  tablet Commonly known as: FLEXERIL  Take 1 tablet (5 mg total) by mouth 3 (three) times daily.   furosemide 20 MG tablet Commonly known as: Lasix Take 1 tablet (20 mg total) by mouth daily for 5 days.   gabapentin 100 MG capsule Commonly known as: NEURONTIN Take 2 capsules (200 mg total) by mouth 2 (two) times daily.   ibuprofen  600 MG tablet Commonly known as: ADVIL  Take 1 tablet (600 mg total) by mouth every 6 (six) hours.   multivitamin-prenatal 27-0.8 MG Tabs tablet Take 1 tablet by mouth daily at 12 noon.   NIFEdipine 30 MG 24 hr tablet Commonly known as: ADALAT CC Tome 1  tableta (30 mg en total) por va oral diariamente. (Take 1 tablet (30 mg total) by mouth daily.) Start taking on: Apr 02, 2024   oxyCODONE  5 MG immediate release tablet Commonly known as: Oxy IR/ROXICODONE  Take 1 tablet (5 mg total) by mouth every 4 (four) hours as needed for moderate pain (pain score 4-6) or severe pain (pain score 7-10).         Discharge home in stable condition Infant Feeding: Bottle and Breast Infant Disposition:NICU Discharge instruction: per After Visit Summary and Postpartum booklet. Activity: Advance as tolerated. Pelvic rest for 6 weeks.  Diet: routine diet Future Appointments: Future Appointments  Date Time Provider Department Center  04/03/2024  8:30 AM [facility] NURSE [facility]  05/07/2024  1:55 PM Cresenzo, Mardee Shackle, MD [facility]   Follow up Visit:  Follow-up Information     Anda Bamberg, MD Follow up on 05/01/2024.   Specialty: Surgery Why: 945 am Contact information: [address] CENTRAL  SURGERY [place] [address] [phone]         [facility] Follow up in 2 day(s).   Specialty: Obstetrics and Gynecology Why: For wound re-check, blood pressure check Contact information: [address] [place]  [phone]                  Please schedule this patient for a In person postpartum visit in 4 weeks with the following provider: MD. Additional Postpartum F/U:Incision check 2-3 days and BP check 2-3 days  High risk pregnancy complicated by: HELLP syndrome Delivery mode:  C-Section, Low Transverse Anticipated Birth Control:  Nexplanon likely at [facility]   04/01/2024 Granville Layer, MD

## 2024-04-01 NOTE — Social Work (Signed)
 CLINICAL SOCIAL WORK MATERNAL/CHILD NOTE  Patient Details  Name: Toni Klein MRN: [id number] Date of Birth: 03/27/2023  Date:  04/01/2024  Clinical Social Worker Initiating Note:  Nickolas Barr, [place] Date/Time: Initiated:  04/01/24/1334     Child's Name:  Toni Klein   Biological Parents:  Mother, Father   Need for Interpreter:  Spanish   Reason for Referral:  Other (Comment) (NICU admission)   Address:  [address] [place] [address]    Phone number:  [phone] (home)     Additional phone number:   Household Members/Support Persons (HM/SP):   Household Member/Support Person 1, Household Member/Support Person 2   HM/SP Name Relationship DOB or Age  HM/SP -1 Toni Klein Spouse 03/07/2001  HM/SP -2 Toni Klein Daughter 06/13/22  HM/SP -3        HM/SP -4        HM/SP -5        HM/SP -6        HM/SP -7        HM/SP -8          Natural Supports (not living in the home):  Extended Family   Professional Supports: None   Employment: Unemployed (FOB employed-Construction)   Type of Work:     Education:  Other (comment) (Middle School)   Homebound arranged:    Surveyor, quantity Resources:  Other (Comment) (Medicaid Potential)   Other Resources:  Food Stamps  , [facility]   Cultural/Religious Considerations Which May Impact Care:    Strengths:  Ability to meet basic needs  , Home prepared for child  , Pediatrician chosen   Psychotropic Medications:         Pediatrician:    [place]  Pediatrician List:   [place] [facility] ([address] [employer])  [place]    [place]      Pediatrician Fax Number:    Risk Factors/Current Problems:  None   Cognitive State:  Able to Concentrate  , Alert     Mood/Affect:  Calm    CSW Assessment: CSW received consult for NICU admission. CSW met with MOB to offer  support and complete assessment.   CSW initiated the assessment with in-person interpreter Rexene Catching and later used virtual interpreter Arlyce Lambert [phone]). CSW met with MOB at bedside and reintroduced role and that CSW was there to continue the assessment. CSW congratulated MOB on her baby Toni, Toni Klein. MOB had previously confirmed that the demographic information on hospital file was correct. CSW asked MOB how she had been doing and MOB reported that she was doing okay. CSW asked MOB if the NICU staff had been keeping her well-informed about the infant's care. MOB responded yes. CSW provided education and discussed available support services offered in the NICU. MOB expressed that she understood and had no questions at that time. CSW asked MOB household situation. MOB reported that she lived with spouse and daughter (see chart above). She reported that she is currently unemployed and receives both [facility] and SNAP benefits. She mentioned that FOB works in [employer]. CSW inquired if MOB had essential items to care for the infant. MOB replied that she had diapers, wipes, and a crib. However, they were still looking for a car seat. CSW provided information about [facility] [employer] and offered to make a referral for baby items, but MOB declined.  She stated that they would be able to get a car seat and other items to the infant.  CSW encouraged MOB to contact CSW if she needed any support getting infant items, and MOB agreed to inform CSW. CSW inquired about MOB support system. MOB identified FOB, and FOB's family as her primary supports. CSW acknowledged MOB support network during this time.  CSW assessed MOB feelings surrounding her infant NICU admission and her own medical condition. MOB reported that she has felt fine. CSW asked MOB if she had mental health history, and she denied mental health history. CSW inquired if MOB had experienced PPD/anxiety with her older child and she denied experiencing PPD/anxiety. CSW  assessed MOB for safety. MOB denied SI/HI and concerns with domestic violence. CSW provided education regarding the baby blues period vs. perinatal mood disorders, and provided education regarding mental health follow up and discussed [facility] urgent care. CSW recommended MOB complete a self-evaluation during the postpartum time period using the New Mom Checklist from [employer]partum [employer] and encouraged MOB to contact a medical professional if symptoms are noted at any time.  CSW asked if MOB had chosen a pediatrician. MOB reported [facility].  CSW will continue to offer support and resources to family while infant remains in NICU.  CSW Plan/Description:  Perinatal Mood and Anxiety Disorder (PMADs) Education, Psychosocial Support and Ongoing Assessment of Needs    Rebecka Can, LCSW 04/01/2024, 3:46 PM

## 2024-04-02 ENCOUNTER — Inpatient Hospital Stay (HOSPITAL_COMMUNITY)
Admission: AD | Admit: 2024-04-02 | Discharge: 2024-04-03 | Disposition: A | Discharge status: Home / Self Care | Payer: MEDICAID | Attending: Obstetrics and Gynecology | Admitting: Obstetrics and Gynecology

## 2024-04-02 ENCOUNTER — Encounter (HOSPITAL_COMMUNITY): Payer: Self-pay | Admitting: Obstetrics and Gynecology

## 2024-04-02 DIAGNOSIS — O864 Pyrexia of unknown origin following delivery: Secondary | ICD-10-CM | POA: Insufficient documentation

## 2024-04-02 DIAGNOSIS — Z9049 Acquired absence of other specified parts of digestive tract: Secondary | ICD-10-CM

## 2024-04-02 DIAGNOSIS — R748 Abnormal levels of other serum enzymes: Secondary | ICD-10-CM | POA: Diagnosis not present

## 2024-04-02 DIAGNOSIS — R6883 Chills (without fever): Secondary | ICD-10-CM | POA: Diagnosis not present

## 2024-04-02 DIAGNOSIS — Z98891 History of uterine scar from previous surgery: Secondary | ICD-10-CM

## 2024-04-02 LAB — CBC WITH DIFFERENTIAL/PLATELET
Abs Immature Granulocytes: 0.19 10*3/uL — ABNORMAL HIGH (ref 0.00–0.07)
Basophils Absolute: 0 10*3/uL (ref 0.0–0.1)
Basophils Relative: 0 %
Eosinophils Absolute: 0.1 10*3/uL (ref 0.0–0.5)
Eosinophils Relative: 1 %
HCT: 36.4 % (ref 36.0–46.0)
Hemoglobin: 12.7 g/dL (ref 12.0–15.0)
Immature Granulocytes: 2 %
Lymphocytes Relative: 14 %
Lymphs Abs: 1.8 10*3/uL (ref 0.7–4.0)
MCH: 30.5 pg (ref 26.0–34.0)
MCHC: 34.9 g/dL (ref 30.0–36.0)
MCV: 87.3 fL (ref 80.0–100.0)
Monocytes Absolute: 1 10*3/uL (ref 0.1–1.0)
Monocytes Relative: 8 %
Neutro Abs: 10 10*3/uL — ABNORMAL HIGH (ref 1.7–7.7)
Neutrophils Relative %: 75 %
Platelets: 332 10*3/uL (ref 150–400)
RBC: 4.17 MIL/uL (ref 3.87–5.11)
RDW: 13.7 % (ref 11.5–15.5)
WBC: 13 10*3/uL — ABNORMAL HIGH (ref 4.0–10.5)
nRBC: 0 % (ref 0.0–0.2)

## 2024-04-02 LAB — COMPREHENSIVE METABOLIC PANEL WITH GFR
ALT: 118 U/L — ABNORMAL HIGH (ref 0–44)
AST: 50 U/L — ABNORMAL HIGH (ref 15–41)
Albumin: 3.7 g/dL (ref 3.5–5.0)
Alkaline Phosphatase: 192 U/L — ABNORMAL HIGH (ref 38–126)
Anion gap: 11 (ref 5–15)
BUN: 19 mg/dL (ref 6–20)
CO2: 19 mmol/L — ABNORMAL LOW (ref 22–32)
Calcium: 9.5 mg/dL (ref 8.9–10.3)
Chloride: 106 mmol/L (ref 98–111)
Creatinine, Ser: 0.57 mg/dL (ref 0.44–1.00)
GFR, Estimated: >60 mL/min (ref >60)
Glucose, Bld: 104 mg/dL — ABNORMAL HIGH (ref 70–99)
Potassium: 3.5 mmol/L (ref 3.5–5.1)
Sodium: 136 mmol/L (ref 135–145)
Total Bilirubin: 2.1 mg/dL — ABNORMAL HIGH (ref 0.0–1.2)
Total Protein: 7.6 g/dL (ref 6.5–8.1)

## 2024-04-02 LAB — LACTIC ACID, PLASMA: Lactic Acid, Venous: 0.8 mmol/L (ref 0.5–1.9)

## 2024-04-02 MED ORDER — ACETAMINOPHEN 325 MG PO TABS
650.0000 mg | ORAL_TABLET | Freq: Once | ORAL | Status: AC
  Administered 2024-04-02: 650 mg via ORAL
  Filled 2024-04-02: qty 2

## 2024-04-02 MED ORDER — LACTATED RINGERS IV SOLN: Freq: Once | INTRAVENOUS | Status: AC

## 2024-04-02 NOTE — MAU Note (Signed)
..  Toni Klein is a 23 y.o. at [redacted]w[redacted]d here in MAU reporting: weakness and chills that began today.  Reports she is breastfeeding but it is not more painful than normal, but this morning when she woke up she had puss and blood coming out of nipple, but she applied a patch that she was given in the hospital and it helped  Pain score: 0/10 Vitals:   04/02/24 2147  BP: 128/82  Pulse: 92  Resp: 18  Temp: 98.8 F (37.1 C)     FHT:n/a Lab orders placed from triage:  none

## 2024-04-02 NOTE — MAU Provider Note (Signed)
 Chief Complaint:  No chief complaint on file.   Event Date/Time   First Provider Initiated Contact with Patient 04/02/24 2136       HPI: Toni Klein is a 23 y.o. [id number] who presents to maternity admissions reporting ***. She reports vaginal bleeding, vaginal itching/burning, urinary symptoms, h/a, dizziness, n/v, or fever/chills.    HPI  Past Medical History: Past Medical History:  Diagnosis Date   Ectopic pregnancy    Methotrexate     Past obstetric history: OB History  Gravida Para Term Preterm AB Living  6 2 1 1 4 2   SAB IAB Ectopic Multiple Live Births  3 0 1 0 2    # Outcome Date GA Lbr Len/2nd Weight Sex Type Anes PTL Lv  6 Preterm 03/27/24 [redacted]w[redacted]d  2180 g M CS-LTranv Spinal  LIV  5 Term 06/13/22 [redacted]w[redacted]d 10:24 / 00:36 3300 g F Vag-Spont EPI  LIV  4 Ectopic 11/2020          3 SAB 02/2020     SAB     2 SAB 11/2018     SAB     1 SAB 11/2017     SAB       Past Surgical History: Past Surgical History:  Procedure Laterality Date   CESAREAN SECTION N/A 03/27/2024   Procedure: CESAREAN DELIVERY;  Surgeon: Rik Chasten, MD;  Location: [facility] OR;  Service: Obstetrics;  Laterality: N/A;   EXAM UNDER ANESTHESIA, PELVIC N/A 03/27/2024   Procedure: EXAM UNDER ANESTHESIA, ABDOMINAL WITH CHOLECYSTOTOMY TUBE INSERTION;  Surgeon: Anda Bamberg, MD;  Location: [facility] OR;  Service: General;  Laterality: N/A;   NO PAST SURGERIES     TOOTH EXTRACTION      Family History: Family History  Problem Relation Age of Onset   Healthy Mother    Healthy Father     Social History: Social History   Tobacco Use   Smoking status: Never   Smokeless tobacco: Never  Vaping Use   Vaping status: Never Used  Substance Use Topics   Alcohol use: Never   Drug use: Never    Allergies: No Known Allergies  Meds:  Medications Prior to Admission  Medication Sig Dispense Refill Last Dose/Taking   acetaminophen  (TYLENOL ) 325 MG tablet Take 2 tablets (650 mg total) by mouth every 6  (six) hours. 30 tablet 2    aspirin  EC 81 MG tablet Take 1 tablet (81 mg total) by mouth daily. Swallow whole. 30 tablet 5    cyclobenzaprine  (FLEXERIL ) 5 MG tablet Take 1 tablet (5 mg total) by mouth 3 (three) times daily. 30 tablet 0    furosemide (LASIX) 20 MG tablet Take 1 tablet (20 mg total) by mouth daily for 5 days. 5 tablet 0    gabapentin (NEURONTIN) 100 MG capsule Take 2 capsules (200 mg total) by mouth 2 (two) times daily. 30 capsule 1    ibuprofen  (ADVIL ) 600 MG tablet Take 1 tablet (600 mg total) by mouth every 6 (six) hours. 30 tablet 1    NIFEdipine (ADALAT CC) 30 MG 24 hr tablet Take 1 tablet (30 mg total) by mouth daily. 30 tablet 1    oxyCODONE  (OXY IR/ROXICODONE ) 5 MG immediate release tablet Take 1 tablet (5 mg total) by mouth every 4 (four) hours as needed for moderate pain (pain score 4-6) or severe pain (pain score 7-10). 20 tablet 0    Prenatal Vit-Fe Fumarate-FA (MULTIVITAMIN-PRENATAL) 27-0.8 MG TABS tablet Take 1 tablet by mouth daily at  12 noon.       I have reviewed patient's Past Medical Hx, Surgical Hx, Family Hx, Social Hx, medications and allergies.  ROS:  Review of Systems Other systems negative     Physical Exam  No data found. Constitutional: Well-developed, well-nourished female in no acute distress.  Cardiovascular: normal rate and rhythm, no ectopy audible, S1 & S2 heard, no murmur Respiratory: normal effort, no distress. Lungs CTAB with no wheezes or crackles GI: Abd soft, non-tender.  Nondistended.  No rebound, No guarding.  Bowel Sounds audible  MS: Extremities nontender, no edema, normal ROM Neurologic: Alert and oriented x 4.   Grossly nonfocal. GU: Neg CVAT. Skin:  Warm and Dry Psych:  Affect appropriate.  PELVIC EXAM: Cervix pink, visually closed, without lesion, scant white creamy discharge, vaginal walls and external genitalia normal Bimanual exam: Cervix firm, anterior, neg CMT, uterus nontender, nonenlarged, adnexa without tenderness,  enlargement, or mass  Exam is limited by body habitus.    Labs: No results found for this or any previous visit (from the past 24 hours). --/--/A POS (05/08 0840)  Imaging:  DG CHOLANGIOGRAM  EXISTING TUBE Result Date: 03/31/2024 CLINICAL DATA:  23 year old female with HELLP syndrome status post emergency cesarean section and placement of gallbladder drain during surgery. EXAM: CATHETER CHOLANGIOGRAM COMPARISON:  None Available. CONTRAST:  10 mL - administered via the existing percutaneous drain. FLUOROSCOPY: 5.7 mGy TECHNIQUE: The patient was positioned supine on the fluoroscopy table. A preprocedural spot fluoroscopic image was obtained of the abdomen and the existing percutaneous drainage catheter. Multiple spot fluoroscopic and radiographic images were obtained following the injection of a small amount of contrast via the existing percutaneous drainage catheter. FINDINGS: Cystic duct and common bile duct appear patent with contrast seen in the duodenum. IMPRESSION: Patent cystic duct and biliary tree. Performed By Lorinda Root, PA-C Electronically Signed   By: Melven Stable.  Shick M.D.   On: 03/31/2024 14:42   CT CHEST ABDOMEN PELVIS W CONTRAST Result Date: 03/28/2024 CLINICAL DATA:  Status post emergent Caesarean section for gangrenous cholecystitis, ultimately with cholecystostomy tube placement at the time of surgery. Now with abdominal pain and concern for expanding hematoma. EXAM: CT CHEST, ABDOMEN, AND PELVIS WITH CONTRAST TECHNIQUE: Multidetector CT imaging of the chest, abdomen and pelvis was performed following the standard protocol during bolus administration of intravenous contrast. RADIATION DOSE REDUCTION: This exam was performed according to the departmental dose-optimization program which includes automated exposure control, adjustment of the mA and/or kV according to patient size and/or use of iterative reconstruction technique. CONTRAST:  75mL OMNIPAQUE  IOHEXOL  350 MG/ML SOLN COMPARISON:   03/27/2024 FINDINGS: CT CHEST FINDINGS Cardiovascular: Heart size upper normal. No substantial pericardial effusion. No thoracic aortic aneurysm. Linear density along the lateral wall of the transverse aorta is favored to represent motion artifact given similar appearance in the adjacent pulmonary outflow tract. Mediastinum/Nodes: No mediastinal lymphadenopathy. There is no hilar lymphadenopathy. Fluid in the esophagus is likely secondary to reflux given the fluid distended stomach. There is no axillary lymphadenopathy. Lungs/Pleura: Dependent collapse/consolidative opacity is seen in both lungs with small to moderate right and small left pleural effusions. Musculoskeletal: No worrisome lytic or sclerotic osseous abnormality. CT ABDOMEN PELVIS FINDINGS Hepatobiliary: No suspicious focal abnormality within the liver parenchyma. Gallbladder is decompressed with marked diffuse gallbladder wall thickening and edema. The balloon from the cholecystostomy tube is identified in the region of the gallbladder fundus, perhaps best seen on sagittal image 45 of series 7. Intraluminal positioning of the balloon/catheter tip cannot  be confirmed on this study. No intrahepatic or extrahepatic biliary dilation. Pancreas: No focal mass lesion. No dilatation of the main duct. No intraparenchymal cyst. No peripancreatic edema. Spleen: No splenomegaly. No suspicious focal mass lesion. Adrenals/Urinary Tract: No adrenal nodule or mass. Kidneys unremarkable. No evidence for hydroureter. Urinary bladder is decompressed by Foley catheter. Gas in the bladder lumen is compatible with the instrumentation. Urine in the bladder has high density compatible with excreted contrast from yesterday's CT scan. Stomach/Bowel: Stomach is distended with fluid. Duodenum is nondistended. There is fluid/edema around the duodenal bulb, descending and proximal transverse duodenum. Small bowel loops are gas-filled and mildly distended up to about 2.5 cm  diameter. Appendix is not well visualized. No evidence for colonic obstruction. Vascular/Lymphatic: No abdominal aortic aneurysm. No abdominal aortic atherosclerotic calcification. There is no gastrohepatic or hepatoduodenal ligament lymphadenopathy. No retroperitoneal or mesenteric lymphadenopathy. No pelvic sidewall lymphadenopathy. Reproductive: Enlarged hypervascular uterus compatible with the immediate postoperative state. Endometrial canal appears thickened. There is no adnexal mass. Other: Free fluid is identified around the inferior liver and in the right paracolic gutter fluid is also visible in the left paracolic gutter and in the pelvis. Density of the free fluid is minimally higher than would be expected for simple fluid in the gutters in density in the cul-de-sac is higher still, compatible with blood products. There appears to be a layering hematocrit level in the cul-de-sac, further compatible with small volume hemoperitoneum although infectious debris could have this appearance. Musculoskeletal: Left lower abdominal wall hematoma identified previously is minimally larger on the current exam measuring 6.0 x 4.5 cm (image 95/3) which compares to 4.8 x 3.4 cm previously. It is unclear on this study whether this hematoma is within or superficial to the rectus sheath. There is a small focus of extraluminal contrast puddling superficial to the hematoma (axial 98/series 3) compatible with persistent active extravasation at a location similar to yesterday's exam. On delayed imaging, extravasated contrast layers dependently in the hematoma (see axial 59/series 8) compatible with ongoing bleeding/hemorrhage. Extensive edema/hemorrhage is seen in the low anterior abdominal wall, left greater than right with prominent soft tissue gas in this region, findings compatible with recent Caesarean section. IMPRESSION: 1. Left lower abdominal wall hematoma identified previously is slightly larger on the current exam  measuring 6.0 x 4.5 cm which compares to 4.8 x 3.4 cm previously. There is a small focus of extraluminal contrast puddling superficial to the hematoma compatible with persistent active extravasation at a location similar to yesterday's exam. On delayed imaging, extravasated contrast layers dependently in the hematoma compatible with ongoing bleeding/hemorrhage. 2. Free fluid in the abdomen and pelvis with density higher than would be expected for simple fluid in the gutters, compatible with blood products. There appears to be a layering hematocrit level in the cul-de-sac, further compatible with small volume hemoperitoneum. Volume of intraperitoneal free fluid is generally stable in the interval. 3. Gallbladder is decompressed with marked diffuse gallbladder wall thickening and edema. The balloon from the cholecystostomy tube is identified in the region of the gallbladder fundus, perhaps best seen on sagittal imaging. Intraluminal positioning of the balloon/catheter tip cannot be confirmed on this study. 4. Dependent collapse/consolidative opacity in both lungs with small to moderate right and small left pleural effusions. Imaging features could be compatible with atelectasis and/or pneumonia. 5. Fluid in the esophagus is likely secondary to reflux given the fluid distended stomach. 6. Enlarged hypervascular uterus compatible with the immediate postoperative state. Endometrial canal appears thickened. 7. Gas-filled  and mildly distended small bowel loops up to about 2.5 cm diameter. Imaging features are compatible with ileus. Critical Value/emergent results were called by telephone at the time of interpretation on 03/28/2024 at 6:19 am to provider Keene Pastures , who verbally acknowledged these results. Electronically Signed   By: Donnal Fusi M.D.   On: 03/28/2024 06:20   CT ABDOMEN PELVIS W CONTRAST Result Date: 03/27/2024 CLINICAL DATA:  Cholelithiasis, abdominal pain EXAM: CT ABDOMEN AND PELVIS WITH CONTRAST  TECHNIQUE: Multidetector CT imaging of the abdomen and pelvis was performed using the standard protocol following bolus administration of intravenous contrast. RADIATION DOSE REDUCTION: This exam was performed according to the departmental dose-optimization program which includes automated exposure control, adjustment of the mA and/or kV according to patient size and/or use of iterative reconstruction technique. CONTRAST:  75mL OMNIPAQUE  IOHEXOL  350 MG/ML SOLN COMPARISON:  None Available. FINDINGS: Lower chest: Trace bilateral pleural effusions. Bilateral lower lobe dependent airspace opacities, likely atelectasis although pneumonia is not excluded. Hepatobiliary: Mild periportal edema. No biliary ductal dilatation. Portal vein is patent. Gallbladder wall is markedly thickened, measuring up to 1.8 cm. There appears to be gas within the decompressed collapsed lumen of the gallbladder. There appears to be a balloon retention cholecystostomy catheter in place. It is difficult to confirm that the balloon is in the lumen of the gallbladder and not within the gallbladder wall given the collapsed state of the gallbladder lumen. Pancreas: No focal abnormality or ductal dilatation. Spleen: No focal abnormality.  Normal size. Adrenals/Urinary Tract: No adrenal abnormality. No focal renal abnormality. No stones or hydronephrosis. Urinary bladder is unremarkable. Foley catheter in the in the bladder which is decompressed. Stomach/Bowel: Stomach, large and small bowel grossly unremarkable. Vascular/Lymphatic: No evidence of aneurysm or adenopathy. Reproductive: Enlarged postpartum uterus with changes of recent C-section. Stranding and gas throughout the anterior lower abdominal wall. Other: Small amount of free fluid in the abdomen and pelvis. Small amount of free air adjacent to the liver. As noted above, changes of C-section in the anterior lower abdominal wall with stranding and gas. Left abdominal wall complex fluid  collection measuring 4.8 x 3.4 cm may reflect hematoma. High-density areas noted within this fluid collection which could reflect active extravasation of contrast. Musculoskeletal: No acute or significant osseous findings. IMPRESSION: Markedly thick walled gallbladder with collapse/decompressed lumen. Findings compatible with acute cholecystitis. Balloon retention cholecystostomy in place. It is difficult to confirm exact position as the balloon appears to be in the gallbladder wall rather than the lumen. Recommend clinical correlation with drainage. Mild free fluid in the abdomen and pelvis. Trace bilateral pleural effusions. Dependent bibasilar atelectasis or infiltrates. Mild periportal edema which can be seen with volume overload. Changes of recent C-section. 4.8 x 3.4 cm fluid collection in the left lower abdominal wall with areas of high density within and adjacent to the fluid collection. This could reflect areas of active extravasation/bleeding. Electronically Signed   By: Janeece Mechanic M.D.   On: 03/27/2024 18:05   US  Abdomen Limited RUQ (LIVER/GB) Addendum Date: 03/27/2024 ADDENDUM REPORT: 03/27/2024 11:02 ADDENDUM: The original report was by Dr. Freida Jes. The following addendum is by Dr. Freida Jes: These results were called by telephone at the time of interpretation on 03/27/2024 at 10:52 AM to provider Princess Brooks , who verbally acknowledged these results. Electronically Signed   By: Freida Jes M.D.   On: 03/27/2024 11:02   Result Date: 03/27/2024 CLINICAL DATA:  Abdominal pain.  Third trimester pregnancy. EXAM: ULTRASOUND ABDOMEN LIMITED  RIGHT UPPER QUADRANT COMPARISON:  None Available. FINDINGS: Gallbladder: Abnormal appearance of the gallbladder with asymmetric wall thickening irregular and thick membranous internal contents which are nonshadowing. Sonographic Murphy sign present. No gallstones observed. Common bile duct: Diameter: 0.2 cm Liver: No focal lesion identified.  Within normal limits in parenchymal echogenicity. Portal vein is patent on color Doppler imaging with normal direction of blood flow towards the liver. Other: None. IMPRESSION: 1. Sonographic Abigail Abler sign present with asymmetric gallbladder wall thickening and membranous internal contents of the gallbladder. Appearance strongly favors acute cholecystitis and gangrenous cholecystitis is a distinct possibility. Urgent general surgical consultation recommended. Radiology assistant personnel have been notified to put me in telephone contact with the referring physician or the referring physician's clinical representative in order to discuss these findings. Once this communication is established I will issue an addendum to this report for documentation purposes. Electronically Signed: By: Freida Jes M.D. On: 03/27/2024 10:39   US  MFM OB LIMITED Result Date: 03/27/2024 ----------------------------------------------------------------------  OBSTETRICS REPORT                    (Corrected Final 03/27/2024 10:26 am) ---------------------------------------------------------------------- Patient Info  ID #:       [id number]                          D.O.B.:  07/28/2001 (23 yrs)(F)  Name:       Toni Klein          Visit Date: 03/27/2024 09:29 am              [facility] ---------------------------------------------------------------------- Performed By  Attending:        Penney Bowling DO       Ref. Address:     [facility] [place] [facility]  Performed By:     Carlene Che          Secondary Phy.:   [facility] MAU/Triage                    RDMS  Referred By:      Mardee Shackle                 [facility] ---------------------------------------------------------------------- Orders  #  Description                           Code        Ordered By  1  US  MFM OB LIMITED                     53664.40    Princess Brooks  ----------------------------------------------------------------------  #  Order #                     Accession #                Episode #  1  [id number]                   [id number]  [id number] ---------------------------------------------------------------------- Indications  Abdominal pain in pregnancy                    O99.89  [redacted] weeks gestation of pregnancy                Z3A.36 ---------------------------------------------------------------------- Fetal Evaluation  Num Of Fetuses:         1  Fetal Heart Rate(bpm):  153  Cardiac Activity:       Observed  Presentation:           Cephalic  Placenta:               Posterior  P. Cord Insertion:      Previously seen  Amniotic Fluid  AFI FV:      Within normal limits  AFI Sum(cm)     %Tile       Largest Pocket(cm)  12.9            45          4.7  RUQ(cm)       RLQ(cm)       LUQ(cm)        LLQ(cm)  4.7           3.4           3.3            1.5  Comment:    No placental abruption or previa identified. ---------------------------------------------------------------------- OB History  Gravidity:    6         Term:   1         SAB:   3  Ectopic:      1        Living:  1 ---------------------------------------------------------------------- Gestational Age  LMP:           36w 4d        Date:  07/15/23                  EDD:   04/20/24  Best:          36w 4d     Det. By:  LMP  (07/15/23)          EDD:   04/20/24 ---------------------------------------------------------------------- Anatomy  Diaphragm:             Appears normal         Kidneys:                Appear normal  Stomach:               Appears normal, left   Bladder:                Appears normal                         sided ---------------------------------------------------------------------- Cervix Uterus Adnexa  Cervix  Not visualized (advanced GA >24wks)  Uterus  No abnormality visualized.  Right Ovary  Within normal limits.  Left Ovary  Within normal limits.  Adnexa  No abnormality visualized  ---------------------------------------------------------------------- Comments  Hospital Ultrasound  The patient presented to the MAU for abdominal pain that  was sudden onset and woke the patient from sleep. Liver  enzymes are 2,157 and 1,214 with normla bilirubin and plt  (163k). BP is slightly elevated but the patient is in extreme  pain per the [facility] provider.  Sonographic findings  Single intrauterine pregnancy at 36w 4d  Fetal cardiac activity:  Observed and appears normal.  Presentation: Cephalic.  Limited fetal anatomy appears normal.  Amniotic fluid: Within normal limits.  MVP: 4.7 cm.  Placenta: Posterior. There is no sonographic evidence of  bleeding but the placenta appears very calcified.  Recommendations  - See MFM plan of care  This was a limited ultrasound with a remote read. If an official  MFM consult is requested for any reason please call/place an  order in Epic. ----------------------------------------------------------------------                      Penney Bowling, DO Electronically Signed Corrected Final Report  03/27/2024 10:26 am ----------------------------------------------------------------------   US  MFM FETAL BPP WO NON STRESS Result Date: 03/06/2024 ----------------------------------------------------------------------  OBSTETRICS REPORT                       (Signed Final 03/06/2024 05:39 pm) ---------------------------------------------------------------------- Patient Info  ID #:       [id number]                          D.O.B.:  [birth year] (23 yrs)(F)  Name:       Toni Klein          Visit Date: 03/06/2024 08:39 am              [facility] ---------------------------------------------------------------------- Performed By  Attending:        Cassandria Clever MD        Ref. Address:     [address]                                                             [place], [place]                                                             [address]  Performed By:     Elspeth Hals        [facility]                                                             [facility]  Referred By:      Julianne Octave MD ---------------------------------------------------------------------- Orders  #  Description                           Code        Ordered By  1  US  MFM FETAL BPP WO NON               76819.01    RAVI SHANKAR     STRESS  2  US  MFM OB FOLLOW UP                   76816.01    RAVI [facility] ----------------------------------------------------------------------  #  Order #                     Accession #                Episode #  1  [id number]                   [id number]                 [id number]  2  [id number]                   [id number]                 [id number] ---------------------------------------------------------------------- Indications  Antenatal screening for elevated               Z36.1  alphafetoprotein level (AFP) (3.82 MoM)  Medical complication of pregnancy (positive    O26.90  ANA)  Short interval between pregnancies, 3rd        O09.893  trimester  Encounter for other antenatal screening        Z36.2  follow-up  [redacted] weeks gestation of pregnancy                Z3A.33  Negative Horizon (2023), 2hr GTT WNL ---------------------------------------------------------------------- Vital Signs  BP:          120/66 ---------------------------------------------------------------------- Fetal Evaluation  Num Of Fetuses:         1  Fetal Heart Rate(bpm):  148  Cardiac Activity:       Observed  Presentation:           Cephalic  Placenta:               Posterior  P. Cord Insertion:      Previously seen  Amniotic Fluid  AFI FV:      Within normal limits  AFI Sum(cm)     %Tile       Largest Pocket(cm)  15.21           54          4.73  RUQ(cm)       RLQ(cm)       LUQ(cm)        LLQ(cm)  4.14          4.06           2.28           4.73 ---------------------------------------------------------------------- Biophysical Evaluation  Amniotic F.V:   Pocket => 2 cm             F. Tone:        Observed  F. Movement:    Observed                   Score:          8/8  F. Breathing:   Observed ---------------------------------------------------------------------- Biometry  BPD:      85.5  mm     G. Age:  34w 3d         72  %    CI:        80.25   %    70 - 86                                                          FL/HC:      19.0   %    19.4 - 21.8  HC:      301.5  mm     G. Age:  33w 3d         14  %    HC/AC:      1.04        0.96 - 1.11  AC:      289.9  mm     G. Age:  33w 0d         35  %    FL/BPD:     67.0   %    71 - 87  FL:       57.3  mm     G. Age:  30w 0d        < 1  %    FL/AC:      19.8   %    20 - 24  HUM:      52.1  mm     G. Age:  30w 3d        < 5  %   RIGHT  TIB:      51.3  mm     G. Age:  30w 4d        < 5  %  FIB:      52.5  mm     G. Age:  32w 2d         57  %   LEFT  TIB:      51.3  mm     G. Age:  30w 4d        < 5  %  FIB:      52.5  mm     G. Age:  32w 2d         57  %  Est. FW:    1944  gm      4 lb 5 oz     12  % ---------------------------------------------------------------------- OB History  Gravidity:    6         Term:   1         SAB:   3  Ectopic:      1        Living:  1 ---------------------------------------------------------------------- Gestational Age  LMP:           33w 4d        Date:  07/15/23                  EDD:   04/20/24  U/S Today:  32w 5d                                        EDD:   04/26/24  Best:          33w 4d     Det. By:  LMP  (07/15/23)          EDD:   04/20/24 ---------------------------------------------------------------------- Anatomy  Cranium:               Previously seen        Aortic Arch:            Previously seen  Cavum:                 Previously seen        Ductal Arch:            Previously seen  Ventricles:            Previously seen        Diaphragm:               Appears normal  Choroid Plexus:        Previously seen        Stomach:                Appears normal, left                                                                        sided  Cerebellum:            Previously seen        Abdomen:                Previously seen  Posterior Fossa:       Previously seen        Abdominal Wall:         Previously seen  Face:                  Orbits and profile     Cord Vessels:           Previously seen                         previously seen  Lips:                  Previously seen        Kidneys:                Appear normal  Thoracic:              Previously seen        Bladder:                Appears normal  Heart:                 Appears normal         Spine:                  Previously seen                         (  4CH, axis, and                         situs)  RVOT:                  Previously seen        Upper Extremities:      Previously seen  LVOT:                  Previously seen        Lower Extremities:      Previously seen  Other:  Fetal anatomic survey complete on prior scans. Female gender          previously seen. ---------------------------------------------------------------------- Impression  Increased MSAFP on mid-trimester screening.  Fetal growth is appropriate for gestational age .Amniotic fluid  is normal and good fetal activity is seen .Antenatal testing is  reassuring. BPP 8/8. ---------------------------------------------------------------------- Recommendations  -Continue weekly BPP till delivery. ----------------------------------------------------------------------                 Cassandria Clever, MD Electronically Signed Final Report   03/06/2024 05:39 pm ----------------------------------------------------------------------   US  MFM OB FOLLOW UP Result Date: 03/06/2024 ----------------------------------------------------------------------  OBSTETRICS REPORT                       (Signed Final 03/06/2024 05:39 pm)  ---------------------------------------------------------------------- Patient Info  ID #:       [id number]                          D.O.B.:  2001/11/13 (23 yrs)(F)  Name:       Toni Klein          Visit Date: 03/06/2024 08:39 am              M[facility] ---------------------------------------------------------------------- Performed By  Attending:        Cassandria Clever MD        Ref. Address:     [address]                                                             [place], [place]                                                             [address]  Performed By:     Elspeth Hals       [facility]  Referred By:      Julianne Octave MD ---------------------------------------------------------------------- Orders  #  Description                           Code        Ordered By  1  US  MFM FETAL BPP WO NON               76819.01    RAVI SHANKAR     STRESS  2  US  MFM OB FOLLOW UP                   76816.01    RAVI [facility] ----------------------------------------------------------------------  #  Order #                     Accession #                Episode #  1  [id number]                   [id number]                 [id number]  2  [id number]                   [id number]                 [id number] ---------------------------------------------------------------------- Indications  Antenatal screening for elevated               Z36.1  alphafetoprotein level (AFP) (3.82 MoM)  Medical complication of pregnancy (positive    O26.90  ANA)  Short interval between pregnancies, 3rd        O09.893  trimester  Encounter for other antenatal screening        Z36.2  follow-up  [redacted] weeks gestation of pregnancy                Z3A.33  Negative Horizon (2023), 2hr GTT WNL  ---------------------------------------------------------------------- Vital Signs  BP:          120/66 ---------------------------------------------------------------------- Fetal Evaluation  Num Of Fetuses:         1  Fetal Heart Rate(bpm):  148  Cardiac Activity:       Observed  Presentation:           Cephalic  Placenta:               Posterior  P. Cord Insertion:      Previously seen  Amniotic Fluid  AFI FV:      Within normal limits  AFI Sum(cm)     %Tile       Largest Pocket(cm)  15.21           54          4.73  RUQ(cm)       RLQ(cm)       LUQ(cm)        LLQ(cm)  4.14  4.06          2.28           4.73 ---------------------------------------------------------------------- Biophysical Evaluation  Amniotic F.V:   Pocket => 2 cm             F. Tone:        Observed  F. Movement:    Observed                   Score:          8/8  F. Breathing:   Observed ---------------------------------------------------------------------- Biometry  BPD:      85.5  mm     G. Age:  34w 3d         72  %    CI:        80.25   %    70 - 86                                                          FL/HC:      19.0   %    19.4 - 21.8  HC:      301.5  mm     G. Age:  33w 3d         14  %    HC/AC:      1.04        0.96 - 1.11  AC:      289.9  mm     G. Age:  33w 0d         35  %    FL/BPD:     67.0   %    71 - 87  FL:       57.3  mm     G. Age:  30w 0d        < 1  %    FL/AC:      19.8   %    20 - 24  HUM:      52.1  mm     G. Age:  30w 3d        < 5  %   RIGHT  TIB:      51.3  mm     G. Age:  30w 4d        < 5  %  FIB:      52.5  mm     G. Age:  32w 2d         57  %   LEFT  TIB:      51.3  mm     G. Age:  30w 4d        < 5  %  FIB:      52.5  mm     G. Age:  32w 2d         57  %  Est. FW:    1944  gm      4 lb 5 oz     12  % ---------------------------------------------------------------------- OB History  Gravidity:    6         Term:   1         SAB:   3  Ectopic:      1  Living:  1  ---------------------------------------------------------------------- Gestational Age  LMP:           33w 4d        Date:  07/15/23                  EDD:   04/20/24  U/S Today:     32w 5d                                        EDD:   04/26/24  Best:          33w 4d     Det. By:  LMP  (07/15/23)          EDD:   04/20/24 ---------------------------------------------------------------------- Anatomy  Cranium:               Previously seen        Aortic Arch:            Previously seen  Cavum:                 Previously seen        Ductal Arch:            Previously seen  Ventricles:            Previously seen        Diaphragm:              Appears normal  Choroid Plexus:        Previously seen        Stomach:                Appears normal, left                                                                        sided  Cerebellum:            Previously seen        Abdomen:                Previously seen  Posterior Fossa:       Previously seen        Abdominal Wall:         Previously seen  Face:                  Orbits and profile     Cord Vessels:           Previously seen                         previously seen  Lips:                  Previously seen        Kidneys:                Appear normal  Thoracic:              Previously seen        Bladder:                Appears normal  Heart:  Appears normal         Spine:                  Previously seen                         (4CH, axis, and                         situs)  RVOT:                  Previously seen        Upper Extremities:      Previously seen  LVOT:                  Previously seen        Lower Extremities:      Previously seen  Other:  Fetal anatomic survey complete on prior scans. Female gender          previously seen. ---------------------------------------------------------------------- Impression  Increased MSAFP on mid-trimester screening.  Fetal growth is appropriate for gestational age .Amniotic fluid  is normal and good fetal activity  is seen .Antenatal testing is  reassuring. BPP 8/8. ---------------------------------------------------------------------- Recommendations  -Continue weekly BPP till delivery. ----------------------------------------------------------------------                 Cassandria Clever, MD Electronically Signed Final Report   03/06/2024 05:39 pm ----------------------------------------------------------------------    MAU Course/MDM: I have reviewed the triage vital signs and the nursing notes.   Pertinent labs & imaging results that were available during my care of the patient were reviewed by me and considered in my medical decision making (see chart for details).      I have reviewed her medical records including past results, notes and treatments.   I have ordered labs as follows: Imaging ordered:  Results reviewed.   Consult ***.   Treatments in MAU included ***.   Pt stable at time of discharge.  Assessment: No diagnosis found.  Plan: Discharge home Recommend *** Rx sent for *** for ***   Encouraged to return here or to other Urgent Care/ED if she develops worsening of symptoms, increase in pain, fever, or other concerning symptoms.   Holmes Lusher CNM, MSN Certified Nurse-Midwife 04/02/2024 9:36 PM

## 2024-04-03 ENCOUNTER — Inpatient Hospital Stay (HOSPITAL_COMMUNITY)
Admission: AD | Admit: 2024-04-03 | Discharge: 2024-04-05 | Disposition: A | Discharge status: Home / Self Care | Payer: MEDICAID | Attending: Obstetrics & Gynecology | Admitting: Obstetrics & Gynecology

## 2024-04-03 ENCOUNTER — Ambulatory Visit (INDEPENDENT_AMBULATORY_CARE_PROVIDER_SITE_OTHER): Payer: Self-pay | Admitting: *Deleted

## 2024-04-03 ENCOUNTER — Inpatient Hospital Stay (HOSPITAL_COMMUNITY): Payer: MEDICAID

## 2024-04-03 ENCOUNTER — Encounter (HOSPITAL_COMMUNITY): Payer: Self-pay | Admitting: Obstetrics & Gynecology

## 2024-04-03 ENCOUNTER — Other Ambulatory Visit: Payer: Self-pay

## 2024-04-03 VITALS — BP 121/87 | HR 113 | Temp 97.8°F | Ht 61.0 in | Wt 117.5 lb

## 2024-04-03 DIAGNOSIS — R748 Abnormal levels of other serum enzymes: Secondary | ICD-10-CM

## 2024-04-03 DIAGNOSIS — Z862 Personal history of diseases of the blood and blood-forming organs and certain disorders involving the immune mechanism: Secondary | ICD-10-CM

## 2024-04-03 DIAGNOSIS — S301XXA Contusion of abdominal wall, initial encounter: Secondary | ICD-10-CM | POA: Diagnosis present

## 2024-04-03 DIAGNOSIS — G8918 Other acute postprocedural pain: Secondary | ICD-10-CM | POA: Diagnosis not present

## 2024-04-03 DIAGNOSIS — R6883 Chills (without fever): Secondary | ICD-10-CM

## 2024-04-03 DIAGNOSIS — Z9049 Acquired absence of other specified parts of digestive tract: Secondary | ICD-10-CM | POA: Diagnosis not present

## 2024-04-03 DIAGNOSIS — Z4889 Encounter for other specified surgical aftercare: Secondary | ICD-10-CM

## 2024-04-03 DIAGNOSIS — O902 Hematoma of obstetric wound: Secondary | ICD-10-CM | POA: Insufficient documentation

## 2024-04-03 DIAGNOSIS — Z98891 History of uterine scar from previous surgery: Secondary | ICD-10-CM

## 2024-04-03 DIAGNOSIS — K810 Acute cholecystitis: Secondary | ICD-10-CM | POA: Insufficient documentation

## 2024-04-03 DIAGNOSIS — Z0130 Encounter for examination of blood pressure without abnormal findings: Secondary | ICD-10-CM

## 2024-04-03 DIAGNOSIS — S3011XA Contusion of abdominal wall, initial encounter: Secondary | ICD-10-CM | POA: Diagnosis present

## 2024-04-03 DIAGNOSIS — O9963 Diseases of the digestive system complicating the puerperium: Secondary | ICD-10-CM | POA: Diagnosis not present

## 2024-04-03 DIAGNOSIS — T8149XA Infection following a procedure, other surgical site, initial encounter: Secondary | ICD-10-CM

## 2024-04-03 DIAGNOSIS — S301XXD Contusion of abdominal wall, subsequent encounter: Secondary | ICD-10-CM

## 2024-04-03 DIAGNOSIS — Z8759 Personal history of other complications of pregnancy, childbirth and the puerperium: Secondary | ICD-10-CM

## 2024-04-03 HISTORY — DX: Disseminated intravascular coagulation (defibrination syndrome): D65

## 2024-04-03 LAB — CBC
HCT: 39.1 % (ref 36.0–46.0)
Hemoglobin: 13.7 g/dL (ref 12.0–15.0)
MCH: 30.1 pg (ref 26.0–34.0)
MCHC: 35 g/dL (ref 30.0–36.0)
MCV: 85.9 fL (ref 80.0–100.0)
Platelets: 404 10*3/uL — ABNORMAL HIGH (ref 150–400)
RBC: 4.55 MIL/uL (ref 3.87–5.11)
RDW: 13.6 % (ref 11.5–15.5)
WBC: 18.9 10*3/uL — ABNORMAL HIGH (ref 4.0–10.5)
nRBC: 0 % (ref 0.0–0.2)

## 2024-04-03 LAB — COMPREHENSIVE METABOLIC PANEL WITH GFR
ALT: 100 U/L — ABNORMAL HIGH (ref 0–44)
AST: 45 U/L — ABNORMAL HIGH (ref 15–41)
Albumin: 3.4 g/dL — ABNORMAL LOW (ref 3.5–5.0)
Alkaline Phosphatase: 178 U/L — ABNORMAL HIGH (ref 38–126)
Anion gap: 12 (ref 5–15)
BUN: 22 mg/dL — ABNORMAL HIGH (ref 6–20)
CO2: 21 mmol/L — ABNORMAL LOW (ref 22–32)
Calcium: 9.4 mg/dL (ref 8.9–10.3)
Chloride: 106 mmol/L (ref 98–111)
Creatinine, Ser: 0.53 mg/dL (ref 0.44–1.00)
GFR, Estimated: >60 mL/min (ref >60)
Glucose, Bld: 102 mg/dL — ABNORMAL HIGH (ref 70–99)
Potassium: 4 mmol/L (ref 3.5–5.1)
Sodium: 139 mmol/L (ref 135–145)
Total Bilirubin: 2 mg/dL — ABNORMAL HIGH (ref 0.0–1.2)
Total Protein: 6.8 g/dL (ref 6.5–8.1)

## 2024-04-03 LAB — TYPE AND SCREEN
ABO/RH(D): A POS
Antibody Screen: NEGATIVE

## 2024-04-03 MED ORDER — CALCIUM CARBONATE ANTACID 500 MG PO CHEW: 2.0000 | CHEWABLE_TABLET | ORAL | Status: DC | PRN

## 2024-04-03 MED ORDER — PIPERACILLIN-TAZOBACTAM 3.375 G IVPB
3.3750 g | Freq: Three times a day (TID) | INTRAVENOUS | Status: DC
  Administered 2024-04-04 – 2024-04-05 (×5): 3.375 g via INTRAVENOUS
  Filled 2024-04-03 (×5): qty 50

## 2024-04-03 MED ORDER — SODIUM CHLORIDE 0.9 % IV SOLN: INTRAVENOUS | Status: AC | PRN

## 2024-04-03 MED ORDER — LACTATED RINGERS IV SOLN: 125.0000 mL/h | INTRAVENOUS | Status: AC

## 2024-04-03 MED ORDER — ENOXAPARIN SODIUM 40 MG/0.4ML IJ SOSY
40.0000 mg | PREFILLED_SYRINGE | INTRAMUSCULAR | Status: DC
  Administered 2024-04-03 – 2024-04-04 (×2): 40 mg via SUBCUTANEOUS
  Filled 2024-04-03 (×2): qty 0.4

## 2024-04-03 MED ORDER — PRENATAL MULTIVITAMIN CH
1.0000 | ORAL_TABLET | Freq: Every day | ORAL | Status: DC
  Administered 2024-04-04 – 2024-04-05 (×2): 1 via ORAL
  Filled 2024-04-03 (×2): qty 1

## 2024-04-03 MED ORDER — DIPHENHYDRAMINE HCL 25 MG PO CAPS
25.0000 mg | ORAL_CAPSULE | Freq: Four times a day (QID) | ORAL | Status: DC | PRN
  Administered 2024-04-03: 25 mg via ORAL
  Filled 2024-04-03: qty 1

## 2024-04-03 MED ORDER — OXYCODONE HCL 5 MG PO TABS
5.0000 mg | ORAL_TABLET | ORAL | Status: DC | PRN
  Administered 2024-04-03 – 2024-04-05 (×6): 5 mg via ORAL
  Filled 2024-04-03 (×6): qty 1

## 2024-04-03 MED ORDER — DOCUSATE SODIUM 100 MG PO CAPS
100.0000 mg | ORAL_CAPSULE | Freq: Every day | ORAL | Status: DC
  Administered 2024-04-04 – 2024-04-05 (×2): 100 mg via ORAL
  Filled 2024-04-03 (×2): qty 1

## 2024-04-03 MED ORDER — METRONIDAZOLE 500 MG/100ML IV SOLN
500.0000 mg | Freq: Two times a day (BID) | INTRAVENOUS | Status: DC
  Filled 2024-04-03: qty 100

## 2024-04-03 MED ORDER — PIPERACILLIN-TAZOBACTAM 3.375 G IVPB 30 MIN
3.3750 g | Freq: Once | INTRAVENOUS | Status: AC
  Administered 2024-04-03: 3.375 g via INTRAVENOUS
  Filled 2024-04-03: qty 50

## 2024-04-03 MED ORDER — IOHEXOL 350 MG/ML SOLN
75.0000 mL | Freq: Once | INTRAVENOUS | Status: AC | PRN
  Administered 2024-04-03: 75 mL via INTRAVENOUS

## 2024-04-03 MED ORDER — ACETAMINOPHEN 325 MG PO TABS
650.0000 mg | ORAL_TABLET | ORAL | Status: DC | PRN
  Administered 2024-04-04: 650 mg via ORAL
  Filled 2024-04-03: qty 2

## 2024-04-03 NOTE — MAU Provider Note (Signed)
 History     CSN: [id number]  Arrival date and time: 04/03/24 4098   None     Chief Complaint  Patient presents with   Post-op Problem   HPI Patient presents to the MAU for evaluation of surgical wound.  Was seen in clinic today for wound evaluation as well as blood pressure check.  Skin incision appear to be healing well but there was significant pain in the left side.  Patient has history of recent C-section (5/8) after being diagnosed with gangrenous cholecystitis and HELLP.  Postop rectus sheath hematoma was appreciated which measured 6.0 cm x 4.5 cm.  Patient reports chills but no known fever.  Has been taking ibuprofen  around-the-clock and oxycodone  at night to help sleep.  Patient reports the pain is okay during the day but worse at night.  Patient was seen in the MAU yesterday for similar issue but also reported having a fever.  WBCs had increased to 13.  Patient was discharged home with conservative measures.   OB History     Gravida  6   Para  2   Term  1   Preterm  1   AB  4   Living  2      SAB  3   IAB  0   Ectopic  1   Multiple  0   Live Births  2           Past Medical History:  Diagnosis Date   Blood transfusion without reported diagnosis 2025   DIC (disseminated intravascular coagulation) (HCC)    Ectopic pregnancy    Methotrexate    HELLP (hemolytic anemia/elev liver enzymes/low platelets in pregnancy) 2025    Past Surgical History:  Procedure Laterality Date   CESAREAN SECTION N/A 03/27/2024   Procedure: CESAREAN DELIVERY;  Surgeon: Rik Chasten, MD;  Location: [facility] OR;  Service: Obstetrics;  Laterality: N/A;   EXAM UNDER ANESTHESIA, PELVIC N/A 03/27/2024   Procedure: EXAM UNDER ANESTHESIA, ABDOMINAL WITH CHOLECYSTOTOMY TUBE INSERTION;  Surgeon: Anda Bamberg, MD;  Location: [facility] OR;  Service: General;  Laterality: N/A;   NO PAST SURGERIES     TOOTH EXTRACTION      Family History  Problem Relation Age of Onset   Healthy Mother     Healthy Father     Social History   Tobacco Use   Smoking status: Never   Smokeless tobacco: Never  Vaping Use   Vaping status: Never Used  Substance Use Topics   Alcohol use: Never   Drug use: Never    Allergies: No Known Allergies  Medications Prior to Admission  Medication Sig Dispense Refill Last Dose/Taking   acetaminophen  (TYLENOL ) 325 MG tablet Take 2 tablets (650 mg total) by mouth every 6 (six) hours. 30 tablet 2 04/02/2024   cyclobenzaprine  (FLEXERIL ) 5 MG tablet Take 1 tablet (5 mg total) by mouth 3 (three) times daily. 30 tablet 0 04/02/2024   furosemide (LASIX) 20 MG tablet Take 1 tablet (20 mg total) by mouth daily for 5 days. 5 tablet 0 04/02/2024   gabapentin (NEURONTIN) 100 MG capsule Take 2 capsules (200 mg total) by mouth 2 (two) times daily. 30 capsule 1 04/02/2024   ibuprofen  (ADVIL ) 600 MG tablet Take 1 tablet (600 mg total) by mouth every 6 (six) hours. 30 tablet 1 04/02/2024   NIFEdipine (ADALAT CC) 30 MG 24 hr tablet Take 1 tablet (30 mg total) by mouth daily. 30 tablet 1 04/02/2024   oxyCODONE  (OXY IR/ROXICODONE ) 5  MG immediate release tablet Take 1 tablet (5 mg total) by mouth every 4 (four) hours as needed for moderate pain (pain score 4-6) or severe pain (pain score 7-10). 20 tablet 0 04/03/2024 Morning   Prenatal Vit-Fe Fumarate-FA (MULTIVITAMIN-PRENATAL) 27-0.8 MG TABS tablet Take 1 tablet by mouth daily at 12 noon.   04/02/2024    Review of Systems  Constitutional:  Positive for chills. Negative for fever.  Respiratory:  Negative for chest tightness and shortness of breath.   Gastrointestinal:  Positive for abdominal distention and abdominal pain.  Hematological:  Bruises/bleeds easily.  All other systems reviewed and are negative.  Physical Exam   Blood pressure 132/89, pulse (!) 101, temperature 98.7 F (37.1 C), temperature source Oral, resp. rate 20, SpO2 99%, currently breastfeeding.  Physical Exam Vitals and nursing note reviewed.   Constitutional:      Appearance: Normal appearance.  HENT:     Head: Normocephalic and atraumatic.     Nose: No congestion or rhinorrhea.  Eyes:     Extraocular Movements: Extraocular movements intact.  Cardiovascular:     Rate and Rhythm: Normal rate.  Pulmonary:     Effort: Pulmonary effort is normal.  Abdominal:     Palpations: Abdomen is soft.     Tenderness: There is abdominal tenderness (Considerable tenderness on left lower quadrant where hematoma appreciated).  Musculoskeletal:        General: Normal range of motion.     Cervical back: Normal range of motion.  Skin:    General: Skin is warm.     Capillary Refill: Capillary refill takes less than 2 seconds.     Findings: Bruising (Bruising wear dressing was as well as considerable bruising and palpable hematoma on the left side) present.     Comments: Skin incision itself appears to be healing well with no abnormal erythema or drainage  Neurological:     General: No focal deficit present.     Mental Status: She is alert.     Cranial Nerves: No cranial nerve deficit.  Psychiatric:        Mood and Affect: Mood normal.        Behavior: Behavior normal.     MAU Course  Procedures  MDM CBC CMP CT abdomen/pelvis  Assessment and Plan  Toni Klein is a 23 yo G1P1 presenting for wound evaluation.   Rectal sheath hematoma Patient presenting for pain which is worsening around her left side.  Concern for infection around rectal sheath hematoma.  WBC increased to 18.9 from 13.0 yesterday.  LFTs continue to downtrend.  CT showing 7.4 x 2.9 cm evolving hematoma in the subcutaneous tissue with probable 6.0 x 2.5 cm hematoma in the inferior portion of the left rectus sheath.  Consulted interventional radiology who stated that there was nothing to drain at this time but if after treatment with antibiotics the white count did not improve would recommend repeat imaging and they may reevaluate for drain placement.   Also spoke with general surgery who said that if she had worsening right upper quadrant pain we would need to reconsult but not at this time.  Patient will be admitted for IV antibiotics and white count trending as well as pain management. -Admit to OB specially care - Morning CBC - IV Zosyn  per pharmacy recommendations - Regular diet - 1 g Tylenol  every 8 hours as needed - 5 mg oxycodone  every 4 hours as needed    Ferdie Housekeeper 04/03/2024, 10:46 AM

## 2024-04-03 NOTE — H&P (Signed)
 History    CSN: [id number]   Arrival date and time: 04/03/24 4098    None         Chief Complaint  Patient presents with   Post-op Problem    HPI Patient presents to the MAU for evaluation of surgical wound.  Was seen in clinic today for wound evaluation as well as blood pressure check.  Skin incision appear to be healing well but there was significant pain in the left side.  Patient has history of recent C-section (5/8) after being diagnosed with gangrenous cholecystitis and HELLP.  Postop rectus sheath hematoma was appreciated which measured 6.0 cm x 4.5 cm.  Patient reports chills but no known fever.  Has been taking ibuprofen  around-the-clock and oxycodone  at night to help sleep.  Patient reports the pain is okay during the day but worse at night.   Patient was seen in the MAU yesterday for similar issue but also reported having a fever.  WBCs had increased to 13.  Patient was discharged home with conservative measures.     OB History       Gravida  6   Para  2   Term  1   Preterm  1   AB  4   Living  2        SAB  3   IAB  0   Ectopic  1   Multiple  0   Live Births  2                   Past Medical History:  Diagnosis Date   Blood transfusion without reported diagnosis 2025   DIC (disseminated intravascular coagulation) (HCC)     Ectopic pregnancy      Methotrexate    HELLP (hemolytic anemia/elev liver enzymes/low platelets in pregnancy) 2025               Past Surgical History:  Procedure Laterality Date   CESAREAN SECTION N/A 03/27/2024    Procedure: CESAREAN DELIVERY;  Surgeon: Rik Chasten, MD;  Location: [facility] OR;  Service: Obstetrics;  Laterality: N/A;   EXAM UNDER ANESTHESIA, PELVIC N/A 03/27/2024    Procedure: EXAM UNDER ANESTHESIA, ABDOMINAL WITH CHOLECYSTOTOMY TUBE INSERTION;  Surgeon: Anda Bamberg, MD;  Location: [facility] OR;  Service: General;  Laterality: N/A;   NO PAST SURGERIES       TOOTH EXTRACTION                   Family  History  Problem Relation Age of Onset   Healthy Mother     Healthy Father            Social History  Social History        Tobacco Use   Smoking status: Never   Smokeless tobacco: Never  Vaping Use   Vaping status: Never Used  Substance Use Topics   Alcohol use: Never   Drug use: Never        Allergies:  Allergies  No Known Allergies            Medications Prior to Admission  Medication Sig Dispense Refill Last Dose/Taking   acetaminophen  (TYLENOL ) 325 MG tablet Take 2 tablets (650 mg total) by mouth every 6 (six) hours. 30 tablet 2 04/02/2024   cyclobenzaprine  (FLEXERIL ) 5 MG tablet Take 1 tablet (5 mg total) by mouth 3 (three) times daily. 30 tablet 0 04/02/2024   furosemide (LASIX) 20 MG tablet Take 1 tablet (20 mg total)  by mouth daily for 5 days. 5 tablet 0 04/02/2024   gabapentin (NEURONTIN) 100 MG capsule Take 2 capsules (200 mg total) by mouth 2 (two) times daily. 30 capsule 1 04/02/2024   ibuprofen  (ADVIL ) 600 MG tablet Take 1 tablet (600 mg total) by mouth every 6 (six) hours. 30 tablet 1 04/02/2024   NIFEdipine (ADALAT CC) 30 MG 24 hr tablet Take 1 tablet (30 mg total) by mouth daily. 30 tablet 1 04/02/2024   oxyCODONE  (OXY IR/ROXICODONE ) 5 MG immediate release tablet Take 1 tablet (5 mg total) by mouth every 4 (four) hours as needed for moderate pain (pain score 4-6) or severe pain (pain score 7-10). 20 tablet 0 04/03/2024 Morning   Prenatal Vit-Fe Fumarate-FA (MULTIVITAMIN-PRENATAL) 27-0.8 MG TABS tablet Take 1 tablet by mouth daily at 12 noon.     04/02/2024          Review of Systems  Constitutional:  Positive for chills. Negative for fever.  Respiratory:  Negative for chest tightness and shortness of breath.   Gastrointestinal:  Positive for abdominal distention and abdominal pain.  Hematological:  Bruises/bleeds easily.  All other systems reviewed and are negative.   Physical Exam    Blood pressure 132/89, pulse (!) 101, temperature 98.7 F (37.1 C),  temperature source Oral, resp. rate 20, SpO2 99%, currently breastfeeding.   Physical Exam Vitals and nursing note reviewed.  Constitutional:      Appearance: Normal appearance.  HENT:     Head: Normocephalic and atraumatic.     Nose: No congestion or rhinorrhea.  Eyes:     Extraocular Movements: Extraocular movements intact.  Cardiovascular:     Rate and Rhythm: Normal rate.  Pulmonary:     Effort: Pulmonary effort is normal.  Abdominal:     Palpations: Abdomen is soft.     Tenderness: There is abdominal tenderness (Considerable tenderness on left lower quadrant where hematoma appreciated).  Musculoskeletal:        General: Normal range of motion.     Cervical back: Normal range of motion.  Skin:    General: Skin is warm.     Capillary Refill: Capillary refill takes less than 2 seconds.     Findings: Bruising (Bruising wear dressing was as well as considerable bruising and palpable hematoma on the left side) present.     Comments: Skin incision itself appears to be healing well with no abnormal erythema or drainage  Neurological:     General: No focal deficit present.     Mental Status: She is alert.     Cranial Nerves: No cranial nerve deficit.  Psychiatric:        Mood and Affect: Mood normal.        Behavior: Behavior normal.        MAU Course  Procedures   MDM CBC CMP CT abdomen/pelvis   Assessment and Plan  Toni Klein is a 23 yo G1P1 presenting for wound evaluation.    Rectal sheath hematoma Patient presenting for pain which is worsening around her left side.  Concern for infection around rectal sheath hematoma.  WBC increased to 18.9 from 13.0 yesterday.  LFTs continue to downtrend.  CT showing 7.4 x 2.9 cm evolving hematoma in the subcutaneous tissue with probable 6.0 x 2.5 cm hematoma in the inferior portion of the left rectus sheath.  Consulted interventional radiology who stated that there was nothing to drain at this time but if after  treatment with antibiotics the white count did  not improve would recommend repeat imaging and they may reevaluate for drain placement.  Also spoke with general surgery who said that if she had worsening right upper quadrant pain we would need to reconsult but not at this time.  Patient will be admitted for IV antibiotics and white count trending as well as pain management. -Admit to OB specially care - Morning CBC - IV Zosyn  per pharmacy recommendations - Regular diet - 1 g Tylenol  every 8 hours as needed - 5 mg oxycodone  every 4 hours as needed       Ferdie Housekeeper 04/03/2024, 10:46 AM

## 2024-04-03 NOTE — MAU Note (Signed)
 RN called CT to inquire about estimated time. Alexis informed RN that they would be sending transport for the patient soon.

## 2024-04-03 NOTE — Plan of Care (Signed)
  Problem: Education: Goal: Knowledge of disease or condition will improve Outcome: Adequate for Discharge Goal: Knowledge of the prescribed therapeutic regimen will improve Outcome: Adequate for Discharge Goal: Individualized Educational Video(s) Outcome: Adequate for Discharge   Problem: Clinical Measurements: Goal: Complications related to the disease process, condition or treatment will be avoided or minimized Outcome: Adequate for Discharge   Problem: Education: Goal: Knowledge of General Education information will improve Description: Including pain rating scale, medication(s)/side effects and non-pharmacologic comfort measures Outcome: Adequate for Discharge   Problem: Health Behavior/Discharge Planning: Goal: Ability to manage health-related needs will improve Outcome: Adequate for Discharge   Problem: Clinical Measurements: Goal: Ability to maintain clinical measurements within normal limits will improve Outcome: Adequate for Discharge Goal: Will remain free from infection Outcome: Adequate for Discharge Goal: Diagnostic test results will improve Outcome: Adequate for Discharge Goal: Respiratory complications will improve Outcome: Adequate for Discharge Goal: Cardiovascular complication will be avoided Outcome: Adequate for Discharge   Problem: Activity: Goal: Risk for activity intolerance will decrease Outcome: Adequate for Discharge   Problem: Nutrition: Goal: Adequate nutrition will be maintained Outcome: Adequate for Discharge   Problem: Coping: Goal: Level of anxiety will decrease Outcome: Adequate for Discharge   Problem: Elimination: Goal: Will not experience complications related to bowel motility Outcome: Adequate for Discharge Goal: Will not experience complications related to urinary retention Outcome: Adequate for Discharge   Problem: Pain Managment: Goal: General experience of comfort will improve and/or be controlled Outcome: Adequate for  Discharge   Problem: Safety: Goal: Ability to remain free from injury will improve Outcome: Adequate for Discharge   Problem: Skin Integrity: Goal: Risk for impaired skin integrity will decrease Outcome: Adequate for Discharge

## 2024-04-03 NOTE — MAU Note (Signed)
 Patient transport at bedside to take patient to CT.

## 2024-04-03 NOTE — Lactation Note (Signed)
 This note was copied from a baby's chart.  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 LC in to visit with P2 Mom.  Breasts full, not engorged.  Mom denied any pain in breasts or nipples.  No redness noted when assessed. Mom last pumped 9 hrs prior. LC set up DEBP and assisted Mom to pump on maintain mode of Medela Symphony.  LC sized Mom with 18 flanges and Mom expressed 210 ml. Breasts are soft and comfortable per Mom.   LC retrieved milk labels and took the 210 ml to NICU. LC set up a washing and a drying basin and washed the parts.  RN notified of importance of assisting Mom to pump at least every 4 hrs.  Mom assured LC that she didn't have any further questions.  Encouraged  her to ask her nurse for assistance prn.  OBJECTIVE Infant data: No data recorded O2 Device: Room Air  Infant feeding assessment IDFTS - Readiness: 2 IDFTS - Quality: 4   Maternal data: [id number] C-Section, Low Transverse Pumping frequency: encouraged every 3-4 hrs, Mom currently in MAU for testing Pumped volume: 210 mL Flange Size: 18 Hands-free pumping top sizes: Small/Medium (Blue)  WIC Program: Yes WIC Referral Sent?: Yes What county?: [place] Pump: Manual (Mom was offered a [facility] loaner pump on discharge, RN will call)  ASSESSMENT Infant: Feeding Status: Scheduled 8-11-2-5 Feeding method: Tube/Gavage (Bolus); Bottle Nipple Type: Dr. Leticia Raven Preemie  Maternal: Milk volume: Abundant  INTERVENTIONS/PLAN Interventions: Interventions: DEBP Tools: Pump; Flanges; Hands-free pumping top Pump Education: Setup, frequency, and cleaning; Milk Storage  Plan: Consult Status: NICU follow-up NICU Follow-up type:  Maternal D/C visit; Weekly NICU follow up   [name] 04/03/2024, 11:49 AM

## 2024-04-03 NOTE — MAU Note (Signed)
 Toni Klein is a 23 y.o. at Unknown here in MAU reporting: sent from clinic, pt was unsure why.  Explained dr had let us  know she was coming.  For eval of incision and swelling in LLQ.  Area is very tender to touch.  Bruising noted. No  drainage from incision.  Able to urinate ok,  has been constipated. ( Was told to bring it up here so that we could prescribe something) Onset of complaint: increased last few days. Pain score: incision mild/ LLQ off and on - up to an 8 Vitals:   04/03/24 1006  BP: 132/89  Pulse: (!) 101  Resp: 20  Temp: 98.7 F (37.1 C)  SpO2: 99%      Lab orders placed from triage:

## 2024-04-03 NOTE — Progress Notes (Signed)
 Pt presents for BP check and incision check following C/S on 03/27/24. Per chart review, pt was seen @ MAU last evening due to c/o fever, chills, abdominal soreness and nipple discharge.  Today she reports that she had vomiting last night, continues to feel chills and also has itching all over her body - especially on the palms of her hands and bottom of feet. Pt is also constipated. BP today = 121/87, P = 113, T - 97.8. Dr. Aquilla Knapp in room for incision check due to swelling and ecchymosis noted on Lt side of abdomen above surgical dressing. Pt endorses having significant pain @ this area. Honeycomb dressing was removed from C/S site and incision found to be healing well. No drainage or swelling of incision noted. Redness and slight swelling of mons was observed. Dr. Aquilla Knapp recommended pt to go to hospital (MAU) today for additional evaluation and tests. She will return to office in one week for follow up with MD provider. Dressing from Cystotomy and drainage tube remain intact. Pt states she has appt for follow up of Cystotomy on 6/12. Pt voiced understanding of all information and instructions given.

## 2024-04-04 ENCOUNTER — Other Ambulatory Visit: Payer: Self-pay

## 2024-04-04 ENCOUNTER — Other Ambulatory Visit (HOSPITAL_COMMUNITY): Payer: Self-pay

## 2024-04-04 ENCOUNTER — Encounter (HOSPITAL_COMMUNITY): Payer: Self-pay | Admitting: Obstetrics & Gynecology

## 2024-04-04 DIAGNOSIS — S301XXA Contusion of abdominal wall, initial encounter: Secondary | ICD-10-CM | POA: Diagnosis present

## 2024-04-04 LAB — CBC
HCT: 34.1 % — ABNORMAL LOW (ref 36.0–46.0)
Hemoglobin: 12 g/dL (ref 12.0–15.0)
MCH: 30.8 pg (ref 26.0–34.0)
MCHC: 35.2 g/dL (ref 30.0–36.0)
MCV: 87.4 fL (ref 80.0–100.0)
Platelets: 422 10*3/uL — ABNORMAL HIGH (ref 150–400)
RBC: 3.9 MIL/uL (ref 3.87–5.11)
RDW: 13.8 % (ref 11.5–15.5)
WBC: 10 10*3/uL (ref 4.0–10.5)
nRBC: 0 % (ref 0.0–0.2)

## 2024-04-04 NOTE — Progress Notes (Signed)
 Patient ID: Toni Klein, female   DOB: [birth year], 23 y.o.   MRN: [id number] POD#8 pLTCS + open cholecystotomy tube Post op left subfascial rectus hematoma  Toni Klein is a 23 y.o. female patient.   Patient Active Problem List   Diagnosis Date Noted   Abdominal wall hematoma, post Caesarean section in setting of DIC 04/04/2024   Acute cholecystitis 03/27/2024   History of cesarean section 03/27/2024   History of hemolysis, elevated liver enzymes, and low platelet (HELLP) syndrome 03/27/2024   History of DIC syndrome 03/27/2024   Language barrier affecting health care 03/13/2024   ANA positive 11/19/2023       Past Medical History:  Diagnosis Date   Blood transfusion without reported diagnosis 2025   DIC (disseminated intravascular coagulation) (HCC)    Ectopic pregnancy    Methotrexate    HELLP (hemolytic anemia/elev liver enzymes/low platelets in pregnancy) 2025   Hemolysis, elevated liver enzymes, and low platelet (HELLP) syndrome during pregnancy, antepartum 03/27/2024    No past surgical history pertinent negatives on file.  Scheduled Meds:  docusate sodium   100 mg Oral Daily   enoxaparin (LOVENOX) injection  40 mg Subcutaneous Q24H   prenatal multivitamin  1 tablet Oral Q1200    Continuous Infusions:  sodium chloride  10 mL/hr at 04/03/24 1702   lactated ringers  Stopped (04/04/24 0729)   piperacillin -tazobactam (ZOSYN )  IV 3.375 g (04/04/24 0934)    PRN Meds:sodium chloride , acetaminophen , calcium  carbonate, diphenhydrAMINE , oxyCODONE   No Known Allergies  Active Problems:   Abdominal wall hematoma, post Caesarean section in setting of DIC   History of cesarean section   History of hemolysis, elevated liver enzymes, and low platelet (HELLP) syndrome   History of DIC syndrome   Subjective   Pt feels "much better" Still with hurting in the area of her hematoma on the left  Objective   Vitals:   04/03/24 1935 04/03/24  2301 04/04/24 0420 04/04/24 0822  BP:  118/69 129/74 119/76  Pulse:  99 91 (!) 102  Resp:   18 18  Temp:  98.5 F (36.9 C) 98.5 F (36.9 C) 98.5 F (36.9 C)  TempSrc:  Oral Oral Oral  SpO2: 97% 96% 98% 96%   Vitals:   04/03/24 1006 04/03/24 1630 04/03/24 1934 04/03/24 2301  BP: 132/89 129/88 125/74 118/69   04/04/24 0420 04/04/24 0822  BP: 129/74 119/76     Subjective Objective: Vital signs (most recent): Blood pressure 119/76, pulse (!) 102, temperature 98.5 F (36.9 C), temperature source Oral, resp. rate 18, SpO2 96%, currently breastfeeding.   Gen  WDWN NAD Abdomen  soft tender over the hematomas but otherwise negative Incision  clean dry intact GB drain tube in place     Latest Ref Rng & Units 04/04/2024    5:11 AM 04/03/2024   10:42 AM 04/02/2024   10:10 PM  CBC  WBC 4.0 - 10.5 K/uL 10.0  18.9  13.0   Hemoglobin 12.0 - 15.0 g/dL 40.9  81.1  91.4   Hematocrit 36.0 - 46.0 % 34.1  39.1  36.4   Platelets 150 - 400 K/uL 422  404  332        Latest Ref Rng & Units 04/03/2024   10:42 AM 04/02/2024   10:10 PM 04/01/2024    4:52 AM  CMP  Glucose 70 - 99 mg/dL 782  956  84   BUN 6 - 20 mg/dL 22  19  12    Creatinine 0.44 - 1.00  mg/dL 1.61  0.96  0.45   Sodium 135 - 145 mmol/L 139  136  136   Potassium 3.5 - 5.1 mmol/L 4.0  3.5  3.7   Chloride 98 - 111 mmol/L 106  106  105   CO2 22 - 32 mmol/L 21  19  19    Calcium  8.9 - 10.3 mg/dL 9.4  9.5  9.2   Total Protein 6.5 - 8.1 g/dL 6.8  7.6  6.8   Total Bilirubin 0.0 - 1.2 mg/dL 2.0  2.1  2.1   Alkaline Phos 38 - 126 U/L 178  192  154   AST 15 - 41 U/L 45  50  52   ALT 0 - 44 U/L 100  118  157      Assessment & Plan >POD#8 with abdominal wall hematoma, likely infected on Zosyn  WBC down from 18k to 10k, afebrile IR consulted and they state nothing to drain at this point Recheck WBC in AM  >Acute cholecystitis with cholecystotomy tube in place Gen surg called yesterday and discussed her drain tube and they recommended  continuing with the closed drain and they will follow her up as planned on 05/01/24.  Pod#4 cholangiogram shows good placement and CT yesterday confirms  Wendelyn Halter, MD 04/04/2024, 10:04 AM

## 2024-04-04 NOTE — Plan of Care (Signed)
  Problem: Health Behavior/Discharge Planning: Goal: Ability to manage health-related needs will improve Outcome: Progressing   Problem: Clinical Measurements: Goal: Ability to maintain clinical measurements within normal limits will improve Outcome: Progressing Goal: Will remain free from infection Outcome: Progressing Goal: Diagnostic test results will improve Outcome: Progressing   Problem: Activity: Goal: Risk for activity intolerance will decrease Outcome: Progressing   Problem: Nutrition: Goal: Adequate nutrition will be maintained Outcome: Progressing   Problem: Coping: Goal: Level of anxiety will decrease Outcome: Progressing   Problem: Elimination: Goal: Will not experience complications related to bowel motility Outcome: Progressing Goal: Will not experience complications related to urinary retention Outcome: Progressing   Problem: Pain Managment: Goal: General experience of comfort will improve and/or be controlled Outcome: Progressing

## 2024-04-04 NOTE — Lactation Note (Signed)
 This note was copied from a baby's chart.  NICU Lactation Consultation Note  Patient Name: Toni Klein Today's Date: 04/04/2024 Age:22 days  Reason for consult: Weekly NICU follow-up; NICU baby; Early term 37-38.6wks; Infant < 5lbs; Other (Comment) ([facility] re-admission)  SUBJECTIVE Visited with family of 75 59/37 weeks old AGA NICU female "Toni Klein"; Toni Klein is a P2 and got readmitted to [facility] yesterday due to an hematoma on her incision. She reported she's been pumping and getting normal volumes with each session, praised her for all her efforts. Noticed that pumping hasn't been consistent, understandably due to readmission. Discussed the importance of consistent pumping for the prevention of engorgement and to protect her supply (see maternal assessment). Assisted with washing her pump pieces and took all her breastmilk to the INC in the NICU since she didn't have anyone to take it. Provided more comfort gels per her request, she voiced they have helped a lot. She wasn't able to pick up her pump from the [facility] office yet but FOB bought her a MomCozy wearable a few days ago and that's what she's been using at home. She's aware that she can still do a loaner pump from the hospital she was appreciative.   OBJECTIVE Infant data: Mother's Current Feeding Choice: Breast Milk  O2 Device: Room Air  Infant feeding assessment IDFTS - Readiness: 2 IDFTS - Quality: 3   Maternal data: [id number] C-Section, Low Transverse Pumping frequency: 4 times/24 hours Pumped volume: 90 mL (90-120 ml; up to 210 ml in the AM) Flange Size: 18 Hands-free pumping top sizes: Small/Medium (Blue)  WIC Program: Yes WIC Referral Sent?: Yes What county?: [place] Pump: Manual, Hands Free (MomCozy wearable and Lansinoh hand pump at home)  ASSESSMENT Infant: Feeding Status: Scheduled 8-11-2-5 Feeding method: Bottle; Tube/Gavage (Bolus) Nipple Type: Other (Avent Preemie Wide  Base)  Maternal: Milk volume: Normal Both breasts are soft after pumping, nipples are healed   INTERVENTIONS/PLAN Interventions: Interventions: Breast feeding basics reviewed; Coconut oil; Comfort gels; DEBP; Education Tools: Comfort gels; Coconut oil Pump Education: Setup, frequency, and cleaning; Milk Storage  Plan: Pump both breasts on maintain mode every 3 hours for 20-30 minutes; ideally 8 pumping sessions/24 hours Continue using her breastmilk and comfort gels in between pumping sessions Call out for lactation if she decides on taking a loaner pump on discharge day Parents will continue advancing on bottle feedings   No other support person present and this time. All questions and concerns answered, family to contact [facility] services PRN.  Consult Status: NICU follow-up NICU Follow-up type: Maternal D/C visit   Toni Klein 04/04/2024, 11:57 AM

## 2024-04-04 NOTE — TOC Initial Note (Signed)
 Transition of Care [facility]) - Initial/Assessment Note    Patient Details  Name: Toni Klein MRN: [id number] Date of Birth: [birth year]  Transition [facility]) CM/SW Contact:    Thirza Fleet, RN Phone Number:[phone] 04/04/2024, 2:43 PM  Clinical Narrative:                   #8 pLTCS + open cholecystotomy tube Post op left subfascial rectus hematoma  CM notified provider that patient did not have insurance. Provider made aware to send scripts to [facility] and MATCH provided to cover meds. TOC made aware and provider and please send scripts before Sunday if discharge is this weekend. No dressing changes or other needs per provider. Patient will follow up with OBGYN after discharge.    Activities of Daily Living   ADL Screening (condition at time of admission) Independently performs ADLs?: Yes (appropriate for developmental age) Is the patient deaf or have difficulty hearing?: No Does the patient have difficulty seeing, even when wearing glasses/contacts?: No Does the patient have difficulty concentrating, remembering, or making decisions?: No  Permission Sought/Granted                  Emotional Assessment              Admission diagnosis:  Dr sent for swollen side and incision Patient Active Problem List   Diagnosis Date Noted   Abdominal wall hematoma, post Caesarean section in setting of DIC 04/04/2024   Acute cholecystitis 03/27/2024   History of cesarean section 03/27/2024   History of hemolysis, elevated liver enzymes, and low platelet (HELLP) syndrome 03/27/2024   History of DIC syndrome 03/27/2024   Language barrier affecting health care 03/13/2024   ANA positive 11/19/2023   PCP:  Pcp, No Pharmacy:   [id number] Jonette Nestle, [place] - [address] AT [facility] [address] [place] [address] Phone: [phone] Fax: [phone]  [facility] [address] [place] [address] Phone: [phone] Fax: [phone]     Social Drivers of Health (SDOH) Social History: SDOH Screenings   Food Insecurity: No Food Insecurity (04/03/2024)  Housing: Low Risk  (04/03/2024)  Transportation Needs: No Transportation Needs (04/03/2024)  Utilities: Not At Risk (04/03/2024)  Depression (PHQ2-9): Low Risk  (11/19/2023)  Tobacco Use: Low Risk  (04/03/2024)   SDOH Interventions:     Readmission Risk Interventions     No data to display

## 2024-04-05 ENCOUNTER — Other Ambulatory Visit (HOSPITAL_COMMUNITY): Payer: Self-pay

## 2024-04-05 ENCOUNTER — Ambulatory Visit (HOSPITAL_COMMUNITY): Payer: Self-pay

## 2024-04-05 DIAGNOSIS — Z8759 Personal history of other complications of pregnancy, childbirth and the puerperium: Secondary | ICD-10-CM

## 2024-04-05 DIAGNOSIS — Z862 Personal history of diseases of the blood and blood-forming organs and certain disorders involving the immune mechanism: Secondary | ICD-10-CM | POA: Diagnosis not present

## 2024-04-05 DIAGNOSIS — Z98891 History of uterine scar from previous surgery: Secondary | ICD-10-CM

## 2024-04-05 DIAGNOSIS — S301XXD Contusion of abdominal wall, subsequent encounter: Secondary | ICD-10-CM

## 2024-04-05 LAB — CBC WITH DIFFERENTIAL/PLATELET
Abs Immature Granulocytes: 0.07 10*3/uL (ref 0.00–0.07)
Basophils Absolute: 0.1 10*3/uL (ref 0.0–0.1)
Basophils Relative: 1 %
Eosinophils Absolute: 0.4 10*3/uL (ref 0.0–0.5)
Eosinophils Relative: 6 %
HCT: 35.5 % — ABNORMAL LOW (ref 36.0–46.0)
Hemoglobin: 12 g/dL (ref 12.0–15.0)
Immature Granulocytes: 1 %
Lymphocytes Relative: 27 %
Lymphs Abs: 2 10*3/uL (ref 0.7–4.0)
MCH: 29.8 pg (ref 26.0–34.0)
MCHC: 33.8 g/dL (ref 30.0–36.0)
MCV: 88.1 fL (ref 80.0–100.0)
Monocytes Absolute: 0.6 10*3/uL (ref 0.1–1.0)
Monocytes Relative: 8 %
Neutro Abs: 4.4 10*3/uL (ref 1.7–7.7)
Neutrophils Relative %: 57 %
Platelets: 479 10*3/uL — ABNORMAL HIGH (ref 150–400)
RBC: 4.03 MIL/uL (ref 3.87–5.11)
RDW: 13.5 % (ref 11.5–15.5)
WBC: 7.5 10*3/uL (ref 4.0–10.5)
nRBC: 0 % (ref 0.0–0.2)

## 2024-04-05 MED ORDER — AMOXICILLIN-POT CLAVULANATE 875-125 MG PO TABS
1.0000 | ORAL_TABLET | Freq: Two times a day (BID) | ORAL | 0 refills | Status: DC
  Filled 2024-04-05: qty 20, 10d supply, fill #0

## 2024-04-05 MED ORDER — IBUPROFEN 800 MG PO TABS
800.0000 mg | ORAL_TABLET | Freq: Three times a day (TID) | ORAL | 0 refills | Status: DC | PRN
  Filled 2024-04-05: qty 30, 10d supply, fill #0

## 2024-04-05 MED ORDER — OXYCODONE HCL 5 MG PO TABS
5.0000 mg | ORAL_TABLET | ORAL | 0 refills | Status: DC | PRN
  Filled 2024-04-05: qty 20, 4d supply, fill #0

## 2024-04-05 NOTE — Lactation Note (Signed)
 This note was copied from a baby's chart.  NICU Lactation Consultation Note  Patient Name: Toni Klein Today's Date: 04/05/2024 Age:22 days  Reason for consult: NICU baby; Early term 35-38.6wks; Maternal discharge; Other (Comment); Infant < 5lbs; Follow-up assessment (OBSC re-admission)  SUBJECTIVE Visited with family of 36 82/36 weeks old AGA NICU female; Toni Klein reported she's trying to pump consistently, noticed that her supply has increased, praised her for all her efforts. She is getting discharged from [facility] today. Reviewed discharge education, breastmilk storage guidelines and brought all pump pieces from her room to Toni Klein's. Offered [facility] loaner pump again but she politely declined and voiced her MomCozy pump at home is working out really well for her. She requested a wheel chair prior exiting baby's room, this LC escorted her outside, NICU RN Lauren G provided additional bottles and labels.   OBJECTIVE Infant data: Mother's Current Feeding Choice: Breast Milk  O2 Device: Room Air  Infant feeding assessment IDFTS - Readiness: 2 IDFTS - Quality: 3   Maternal data: [id number] C-Section, Low Transverse Pumping frequency: 5 times/24 hours Pumped volume: 150 mL (150-180 ml; up to 240 ml in the AM (checked bottles w/labels on the fridge as she didn't know exaclty how much she was getting))  WIC Program: Yes WIC Referral Sent?: Yes What county?: [place] Pump: Manual, Hands Free (MomCozy wearable and Lansinoh hand pump at home)  ASSESSMENT Infant: Feeding Status: Scheduled 8-11-2-5 Feeding method: Bottle; Tube/Gavage (Bolus) Nipple Type: Other (advent wide based preemie)  Maternal: Milk volume: Normal  INTERVENTIONS/PLAN Interventions: Interventions: Breast feeding basics reviewed; Coconut oil; Education; DEBP Tools: Comfort gels; Coconut oil  Plan: Pump both breasts on maintain mode every 3 hours for 20-30 minutes; ideally 8 pumping sessions/24  hours Call out for lactation when she decides taking baby "Toni Klein" to breast Parents will continue advancing on bottle feedings   FOB present and supportive. All questions and concerns answered, family to contact [facility] services PRN.  Consult Status: NICU follow-up NICU Follow-up type: Weekly NICU follow up   Pina Sirianni Newman Bare 04/05/2024, 3:54 PM

## 2024-04-05 NOTE — Plan of Care (Signed)

## 2024-04-05 NOTE — Discharge Summary (Signed)
 Physician Discharge Summary  Patient ID: Toni Klein MRN: [id number] DOB/AGE: 11-May-2001 23 y.o.  Admit date: 04/03/2024 Discharge date: 04/05/2024  Admission Diagnoses: Subfascial rectus hematoma, infected  Discharge Diagnoses:  Active Problems:   Abdominal wall hematoma, post Caesarean section in setting of DIC   History of cesarean section   History of hemolysis, elevated liver enzymes, and low platelet (HELLP) syndrome   History of DIC syndrome   Discharged Condition: stable  Hospital Course: admitted with worsening left flank pain and fever [facility] Admitted for IV zosyn  and follow clinical course Responded well to therapy with decrease pain and improving WBC Switched to augmentin  for outpatient therapy Follow up scheduled  IR stated drainage not possible, too little liquid Gen surg stated keep scheduled appt 05/01/24  Consults: IR, gen surg   Significant Diagnostic Studies:  CT ABDOMEN PELVIS W CONTRAST Result Date: 04/03/2024 CLINICAL DATA:  Postoperative abdominal pain status post Cesarean section 1 week ago. EXAM: CT ABDOMEN AND PELVIS WITH CONTRAST TECHNIQUE: Multidetector CT imaging of the abdomen and pelvis was performed using the standard protocol following bolus administration of intravenous contrast. RADIATION DOSE REDUCTION: This exam was performed according to the departmental dose-optimization program which includes automated exposure control, adjustment of the mA and/or kV according to patient size and/or use of iterative reconstruction technique. CONTRAST:  75mL OMNIPAQUE  IOHEXOL  350 MG/ML SOLN COMPARISON:  Mar 28, 2024 FINDINGS: Lower chest: No acute abnormality. Hepatobiliary: No focal liver abnormality is seen. Percutaneous cholecystostomy tube is noted without cholelithiasis. No biliary dilatation. Pancreas: Unremarkable. No pancreatic ductal dilatation or surrounding inflammatory changes. Spleen: Normal in size without focal abnormality.  Adrenals/Urinary Tract: Adrenal glands are unremarkable. Kidneys are normal, without renal calculi, focal lesion, or hydronephrosis. Bladder is unremarkable. Stomach/Bowel: Stomach is within normal limits. Appendix appears normal. No evidence of bowel wall thickening, distention, or inflammatory changes. Vascular/Lymphatic: No significant vascular findings are present. No enlarged abdominal or pelvic lymph nodes. Reproductive: Enlarged uterus consistent with postpartum status. No adnexal abnormality is noted. 7.4 x 2.9 cm evolving hematoma is noted in subcutaneous tissues of left lower quadrant abdominal wall. Probable 6.0 x 2.5 cm hematoma seen in inferior portion of left rectus sheath which is mildly enlarged compared to prior exam. Other: No definite abscess or significant hernia is noted. Musculoskeletal: No acute or significant osseous findings. IMPRESSION: Percutaneous cholecystostomy tube is again noted without cholelithiasis. Enlarged uterus consistent with postpartum status. 7.4 x 2.9 cm evolving hematoma seen in subcutaneous tissues of left lower quadrant abdominal wall. Probable 6.0 x 2.5 cm hematoma seen in inferior portion of left rectus sheath which is mildly enlarged compared to prior exam. Electronically Signed   By: Rosalene Colon M.D.   On: 04/03/2024 13:58     Treatments: IV zosyn   Discharge Exam: Blood pressure 122/80, pulse 83, temperature 98.2 F (36.8 C), temperature source Oral, resp. rate 17, SpO2 97%, currently breastfeeding. Gen WDWN NAD Abdomen incision clean dry, GB drain in place, palpable left flank hematomas tender but improved  Disposition: Discharge disposition: 01-Home or Self Care       Discharge Instructions     Call MD for:  persistant nausea and vomiting   Complete by: As directed    Call MD for:  severe uncontrolled pain   Complete by: As directed    Call MD for:  temperature >100.4   Complete by: As directed    Diet - low sodium heart healthy    Complete by: As directed  Increase activity slowly   Complete by: As directed    No wound care   Complete by: As directed    Sexual Activity Restrictions   Complete by: As directed    No sex 6 weeks      Allergies as of 04/05/2024   No Known Allergies      Medication List     STOP taking these medications    furosemide  20 MG tablet Commonly known as: Lasix    gabapentin  100 MG capsule Commonly known as: NEURONTIN        TAKE these medications    acetaminophen  325 MG tablet Commonly known as: Tylenol  Take 2 tablets (650 mg total) by mouth every 6 (six) hours.   amoxicillin -clavulanate 875-125 MG tablet Commonly known as: AUGMENTIN  Take 1 tablet by mouth 2 (two) times daily.   cyclobenzaprine  5 MG tablet Commonly known as: FLEXERIL  Take 1 tablet (5 mg total) by mouth 3 (three) times daily.   ibuprofen  800 MG tablet Commonly known as: ADVIL  Take 1 tablet (800 mg total) by mouth every 8 (eight) hours as needed. What changed:  medication strength how much to take when to take this reasons to take this   multivitamin-prenatal 27-0.8 MG Tabs tablet Take 1 tablet by mouth daily at 12 noon.   NIFEdipine  30 MG 24 hr tablet Commonly known as: ADALAT  CC Tome 1 tableta (30 mg en total) por va oral diariamente. (Take 1 tablet (30 mg total) by mouth daily.)   oxyCODONE  5 MG immediate release tablet Commonly known as: Oxy IR/ROXICODONE  Take 1 tablet (5 mg total) by mouth every 4 (four) hours as needed for moderate pain (pain score 4-6) or severe pain (pain score 7-10).        Follow-up Information     [facility] Follow up on 04/09/2024.   Specialty: Obstetrics and Gynecology Why: keep appointment Contact information: [address] [place]   [id number] [phone]                Signed: Wendelyn Halter 04/05/2024, 11:24 AM

## 2024-04-08 ENCOUNTER — Other Ambulatory Visit: Payer: Self-pay

## 2024-04-08 ENCOUNTER — Ambulatory Visit (INDEPENDENT_AMBULATORY_CARE_PROVIDER_SITE_OTHER): Payer: Self-pay | Admitting: Obstetrics and Gynecology

## 2024-04-08 ENCOUNTER — Encounter: Payer: Self-pay | Admitting: Obstetrics and Gynecology

## 2024-04-08 VITALS — BP 114/64 | HR 105 | Wt 115.2 lb

## 2024-04-08 DIAGNOSIS — Z658 Other specified problems related to psychosocial circumstances: Secondary | ICD-10-CM

## 2024-04-08 DIAGNOSIS — Z1331 Encounter for screening for depression: Secondary | ICD-10-CM | POA: Diagnosis not present

## 2024-04-08 DIAGNOSIS — S301XXD Contusion of abdominal wall, subsequent encounter: Secondary | ICD-10-CM

## 2024-04-08 DIAGNOSIS — Z98891 History of uterine scar from previous surgery: Secondary | ICD-10-CM

## 2024-04-08 DIAGNOSIS — Z758 Other problems related to medical facilities and other health care: Secondary | ICD-10-CM

## 2024-04-08 DIAGNOSIS — Z0130 Encounter for examination of blood pressure without abnormal findings: Secondary | ICD-10-CM

## 2024-04-08 DIAGNOSIS — Z603 Acculturation difficulty: Secondary | ICD-10-CM

## 2024-04-08 DIAGNOSIS — Z4889 Encounter for other specified surgical aftercare: Secondary | ICD-10-CM

## 2024-04-08 DIAGNOSIS — T8149XA Infection following a procedure, other surgical site, initial encounter: Secondary | ICD-10-CM

## 2024-04-08 NOTE — Progress Notes (Unsigned)
 GYNECOLOGY VISIT  Patient name: Toni Klein MRN [id number]  Date of birth: August 25, 2001 Chief Complaint:   Wound Check   History:  Toni Klein is a 23 y.o. [phone] being seen today for incision check. Reports that her abdomen is less tender. Minimal drainage from her gallbladder drain. Pain is well controlled.   Requesting documentation/letter to immigration lawyer to provide evidence that she needs help with her recovery and to grant visa to her mother to come to the [place]  to help her.     [email or web address]  - provided this email of attorney to have information sent them   Past Medical History:  Diagnosis Date  . Blood transfusion without reported diagnosis 2025  . DIC (disseminated intravascular coagulation) (HCC)   . Ectopic pregnancy    Methotrexate   . HELLP (hemolytic anemia/elev liver enzymes/low platelets in pregnancy) 2025  . Hemolysis, elevated liver enzymes, and low platelet (HELLP) syndrome during pregnancy, antepartum 03/27/2024    Past Surgical History:  Procedure Laterality Date  . CESAREAN SECTION N/A 03/27/2024   Procedure: CESAREAN DELIVERY;  Surgeon: Rik Chasten, MD;  Location[facility] OR;  Service: Obstetrics;  Laterality: N/A;  . EXAM UNDER ANESTHESIA, PELVIC N/A 03/27/2024   Procedure: EXAM UNDER ANESTHESIA, ABDOMINAL WITH CHOLECYSTOTOMY TUBE INSERTION;  Surgeon: Anda Bamberg, MD;  Location: [facility] OR;  Service: General;  Laterality: N/A;  . NO PAST SURGERIES    . TOOTH EXTRACTION      The following portions of the patient's history were reviewed and updated as appropriate: allergies, current medications, past family history, past medical history, past social history, past surgical history and problem list.   Health Maintenance:   Last pap 10/2023 LSIL Last mammogram: n/a  Review of Systems:  Pertinent items are noted in HPI. Comprehensive review of systems was otherwise negative.   Objective:  Physical Exam BP  114/64   Pulse (!) 105   Wt 115 lb 3.2 oz (52.3 kg)   Breastfeeding Yes   BMI 21.77 kg/m    Physical Exam Vitals and nursing note reviewed.  Constitutional:      Appearance: Normal appearance.  HENT:     Head: Normocephalic and atraumatic.  Pulmonary:     Effort: Pulmonary effort is normal.  Abdominal:     Comments: Well approximated low transverse incision Resolving echymosis in LLQ, with firmness deep to the skin, minimally tender  RUQ drain dressing clean/dry/intact  Skin:    General: Skin is warm and dry.  Neurological:     General: No focal deficit present.     Mental Status: She is alert.  Psychiatric:        Mood and Affect: Mood normal.        Behavior: Behavior normal.        Thought Content: Thought content normal.        Judgment: Judgment normal.     Labs and Imaging      Assessment & Plan:   1. Wound infection after surgery (Primary) 2. Abdominal wall hematoma, subsequent encounter 3. Encounter for post surgical wound check 4. Status post cesarean delivery Incision healing well. No longer with signs/symptoms of infection. Continue to monitor incision resolution.    5. Blood pressure check Normotensive today, continue to monitor  6. Language barrier affecting health care In person spanish interpreter used for encounter  7. Psychosocial distress Patient with complex and complicated postpartum/postoperative course. Recommend discussing with immigration lawyer what documentation is required/recommended to aid in  request for visa for her mother.    Routine preventative health maintenance measures emphasized.  Kiki Pelton, MD Minimally Invasive Gynecologic Surgery Center for [facility], [facility]

## 2024-04-12 ENCOUNTER — Inpatient Hospital Stay (HOSPITAL_COMMUNITY)
Admission: AD | Admit: 2024-04-12 | Discharge: 2024-04-13 | Disposition: A | Discharge status: Home / Self Care | Payer: MEDICAID | Attending: Obstetrics and Gynecology | Admitting: Obstetrics and Gynecology

## 2024-04-12 DIAGNOSIS — T85638A Leakage of other specified internal prosthetic devices, implants and grafts, initial encounter: Secondary | ICD-10-CM | POA: Insufficient documentation

## 2024-04-12 DIAGNOSIS — O9089 Other complications of the puerperium, not elsewhere classified: Secondary | ICD-10-CM | POA: Insufficient documentation

## 2024-04-12 DIAGNOSIS — G8918 Other acute postprocedural pain: Secondary | ICD-10-CM

## 2024-04-12 DIAGNOSIS — Z4889 Encounter for other specified surgical aftercare: Secondary | ICD-10-CM

## 2024-04-12 NOTE — MAU Note (Signed)
 MAU Triage Note:  .Toni Klein is a 23 y.o. at Unknown here in MAU reporting: s/p C/S on 03/27/24 with cystotomy placement. Surgery decided to leave the drain in place until 6/18. Patient reporting that she started noticing brown to yellow discharge yesterday. RN notes yellow/brown discharge on dressing measuring approximately 3 cm x 3.5 cm. She also reports new onset stabbing pain in her right lower abdominal area.  Patient complaint: PP leaking catheter, bladder pain, cramping  Pain Score: 5  Pain Location: Abdomen     Onset of complaint: yesterday LMP: No LMP recorded.  Vitals:   04/12/24 2351  BP: 112/69  Pulse: (!) 103  Resp: 16  Temp: 98 F (36.7 C)  SpO2: 100%     Lab orders placed from triage: N/A

## 2024-04-13 ENCOUNTER — Inpatient Hospital Stay (HOSPITAL_COMMUNITY): Payer: MEDICAID

## 2024-04-13 DIAGNOSIS — Z4889 Encounter for other specified surgical aftercare: Secondary | ICD-10-CM

## 2024-04-13 DIAGNOSIS — G8918 Other acute postprocedural pain: Secondary | ICD-10-CM

## 2024-04-13 LAB — CBC WITH DIFFERENTIAL/PLATELET
Abs Immature Granulocytes: 0.03 10*3/uL (ref 0.00–0.07)
Basophils Absolute: 0.1 10*3/uL (ref 0.0–0.1)
Basophils Relative: 1 %
Eosinophils Absolute: 0.2 10*3/uL (ref 0.0–0.5)
Eosinophils Relative: 4 %
HCT: 39.1 % (ref 36.0–46.0)
Hemoglobin: 13.3 g/dL (ref 12.0–15.0)
Immature Granulocytes: 1 %
Lymphocytes Relative: 47 %
Lymphs Abs: 2.6 10*3/uL (ref 0.7–4.0)
MCH: 30.3 pg (ref 26.0–34.0)
MCHC: 34 g/dL (ref 30.0–36.0)
MCV: 89.1 fL (ref 80.0–100.0)
Monocytes Absolute: 0.5 10*3/uL (ref 0.1–1.0)
Monocytes Relative: 10 %
Neutro Abs: 2.1 10*3/uL (ref 1.7–7.7)
Neutrophils Relative %: 37 %
Platelets: 561 10*3/uL — ABNORMAL HIGH (ref 150–400)
RBC: 4.39 MIL/uL (ref 3.87–5.11)
RDW: 13 % (ref 11.5–15.5)
WBC: 5.5 10*3/uL (ref 4.0–10.5)
nRBC: 0 % (ref 0.0–0.2)

## 2024-04-13 LAB — COMPREHENSIVE METABOLIC PANEL WITH GFR
ALT: 60 U/L — ABNORMAL HIGH (ref 0–44)
AST: 47 U/L — ABNORMAL HIGH (ref 15–41)
Albumin: 4.1 g/dL (ref 3.5–5.0)
Alkaline Phosphatase: 139 U/L — ABNORMAL HIGH (ref 38–126)
Anion gap: 7 (ref 5–15)
BUN: 20 mg/dL (ref 6–20)
CO2: 25 mmol/L (ref 22–32)
Calcium: 9.5 mg/dL (ref 8.9–10.3)
Chloride: 105 mmol/L (ref 98–111)
Creatinine, Ser: 0.56 mg/dL (ref 0.44–1.00)
GFR, Estimated: >60 mL/min (ref >60)
Glucose, Bld: 97 mg/dL (ref 70–99)
Potassium: 4 mmol/L (ref 3.5–5.1)
Sodium: 137 mmol/L (ref 135–145)
Total Bilirubin: 1.2 mg/dL (ref 0.0–1.2)
Total Protein: 7.9 g/dL (ref 6.5–8.1)

## 2024-04-13 MED ORDER — IOHEXOL 350 MG/ML SOLN
75.0000 mL | Freq: Once | INTRAVENOUS | Status: AC | PRN
  Administered 2024-04-13: 75 mL via INTRAVENOUS

## 2024-04-13 NOTE — Progress Notes (Signed)
 Progress Note     Subjective: Pt came in because there was drainage on her dressing around the drain and some soreness at drain site. Pt was apparently not instructed to change dressing around drain. Demonstrated this to patient and advised that she may shower daily as well.   Objective: Vital signs in last 24 hours: Temp:  [98 F (36.7 C)] 98 F (36.7 C) (05/24 2351) Pulse Rate:  [103] 103 (05/24 2351) Resp:  [16] 16 (05/24 2351) BP: (112)/(69) 112/69 (05/24 2351) SpO2:  [100 %] 100 % (05/24 2351)    Intake/Output from previous day: No intake/output data recorded. Intake/Output this shift: No intake/output data recorded.  PE: General: pleasant, WD, PP female who is laying in bed in NAD HEENT: sclera anicteric  Heart: regular, rate, and rhythm.   Lungs: Respiratory effort nonlabored Abd: soft, NT, ND, drain capped with small amount fibrinous drainage around catheter, some pressure injury from tube, dressing around drain changed Psych: A&Ox3 with an appropriate affect.    Lab Results:  Recent Labs    04/13/24 0510  WBC 5.5  HGB 13.3  HCT 39.1  PLT 561*   BMET Recent Labs    04/13/24 0510  NA 137  K 4.0  CL 105  CO2 25  GLUCOSE 97  BUN 20  CREATININE 0.56  CALCIUM  9.5   PT/INR No results for input(s): "LABPROT", "INR" in the last 72 hours. CMP     Component Value Date/Time   NA 137 04/13/2024 0510   NA 140 01/18/2024 1105   K 4.0 04/13/2024 0510   CL 105 04/13/2024 0510   CO2 25 04/13/2024 0510   GLUCOSE 97 04/13/2024 0510   BUN 20 04/13/2024 0510   BUN 4 (L) 01/18/2024 1105   CREATININE 0.56 04/13/2024 0510   CALCIUM  9.5 04/13/2024 0510   PROT 7.9 04/13/2024 0510   PROT 6.3 01/18/2024 1105   ALBUMIN  4.1 04/13/2024 0510   ALBUMIN  3.5 (L) 01/18/2024 1105   AST 47 (H) 04/13/2024 0510   ALT 60 (H) 04/13/2024 0510   ALKPHOS 139 (H) 04/13/2024 0510   BILITOT 1.2 04/13/2024 0510   BILITOT <0.2 01/18/2024 1105   GFRNONAA >60 04/13/2024 0510    Lipase     Component Value Date/Time   LIPASE 25 03/27/2024 0847       Studies/Results: CT ABDOMEN PELVIS W CONTRAST Result Date: 04/13/2024 CLINICAL DATA:  History of cholecystostomy tube placement. History of hematoma. EXAM: CT ABDOMEN AND PELVIS WITH CONTRAST TECHNIQUE: Multidetector CT imaging of the abdomen and pelvis was performed using the standard protocol following bolus administration of intravenous contrast. RADIATION DOSE REDUCTION: This exam was performed according to the departmental dose-optimization program which includes automated exposure control, adjustment of the mA and/or kV according to patient size and/or use of iterative reconstruction technique. CONTRAST:  75mL OMNIPAQUE  IOHEXOL  350 MG/ML SOLN COMPARISON:  04/03/2024 FINDINGS: Lower chest: No significant pleural effusion or consolidative change. Hepatobiliary: Cholecystostomy tube is again noted. Gallbladder wall thickening measures up to 5 mm, image 51/6. Previously 5 mm. No calcified gallstones. No pericholecystic fluid or inflammation. No bile duct dilatation. Pancreas: Unremarkable. No pancreatic ductal dilatation or surrounding inflammatory changes. Spleen: Normal in size without focal abnormality. Adrenals/Urinary Tract: Normal adrenal glands. No nephrolithiasis, hydronephrosis or suspicious mass. Urinary bladder appears within normal limits. Stomach/Bowel: Stomach is nondistended. No pathologic dilatation of the large or small bowel loops. The appendix is visualized and appears normal. No bowel wall thickening or inflammation. Vascular/Lymphatic: Patent abdominal vascularity.  Normal appearance of the abdominal aorta. No abdominopelvic adenopathy. Reproductive: Enlarged uterus consistent with postpartum state C-section defect noted within the anterior lower uterine segment. Endometrium measures 1.8 cm in thickness. Calcific calcified structure within the endometrium is identified which may reflect an old fibroid. Other:  Previous left lower quadrant ventral abdominal wall hematoma along the superficial portions of the rectus muscle appears decreased in size and density measuring 4.7 x 2.0 cm with 44 Hounsfield units. On the previous exam this measured 8.2 x 2.5 cm and measured 57 Hounsfield units. Previous left lower quadrant rectus sheath hematoma has resolved. No signs of pneumoperitoneum. Musculoskeletal: No acute or significant osseous findings. IMPRESSION: 1. Unchanged positioning of cholecystostomy tube. Gallbladder wall thickening measures up to 5 mm. No calcified gallstones. No pericholecystic fluid or inflammation. 2. Previous left lower quadrant ventral abdominal wall hematoma along the superficial portions of the rectus muscle appears decreased in size and density. Previous left lower quadrant rectus sheath hematoma has resolved. 3. Enlarged uterus consistent with postpartum state. Endometrium measures 1.8 cm in thickness. Correlate for any history of vaginal bleeding. Electronically Signed   By: Kimberley Penman M.D.   On: 04/13/2024 07:07    Anti-infectives: Anti-infectives (From admission, onward)    None        Assessment/Plan  HELLP syndrome S/p perc chole for possible gb disease (no stones) 5/8 -tube cholagiogram 5/12 shows patent cystic duct - keep tube capped -tube to remain six weeks prior to removal - patient instructed ok to shower daily and change dressing around tube daily  - LFTs improving and CT without apparent complication from cholecystostomy tube, stable gallbladder wall thickening of 5 mm - stable for DC from general surgery perspective  LOS: 0 days    Annetta Killian, [facility] 04/13/2024, 8:38 AM Please see Amion for pager number during day hours 7:00am-4:30pm

## 2024-04-13 NOTE — MAU Note (Signed)
 RN called CT tech to inquire about expected timeframe for patient to go to CT. Patient will be available to go once the labs are resulted.

## 2024-04-13 NOTE — MAU Provider Note (Addendum)
 History     CSN: [id number]  Arrival date and time: 04/12/24 2323   Event Date/Time   First Provider Initiated Contact with Patient 04/13/24 0300      No chief complaint on file.   Toni Klein is a 23 y.o. [id number] at 50 postpartum from primary C/S and cholecystostomy tube insertion.  She presents today for leaking from her cholecystostomy site. She states she noted the leaking yesterday and endorses some pain and cramping in her lower abdomen. She rates the pain a 5/10 and states it is constant.   OB History     Gravida  6   Para  2   Term  1   Preterm  1   AB  4   Living  2      SAB  3   IAB  0   Ectopic  1   Multiple  0   Live Births  2           Past Medical History:  Diagnosis Date   Blood transfusion without reported diagnosis 2025   DIC (disseminated intravascular coagulation) (HCC)    Ectopic pregnancy    Methotrexate    HELLP (hemolytic anemia/elev liver enzymes/low platelets in pregnancy) 2025   Hemolysis, elevated liver enzymes, and low platelet (HELLP) syndrome during pregnancy, antepartum 03/27/2024    Past Surgical History:  Procedure Laterality Date   CESAREAN SECTION N/A 03/27/2024   Procedure: CESAREAN DELIVERY;  Surgeon: Rik Chasten, MD;  Location: [facility] OR;  Service: Obstetrics;  Laterality: N/A;   EXAM UNDER ANESTHESIA, PELVIC N/A 03/27/2024   Procedure: EXAM UNDER ANESTHESIA, ABDOMINAL WITH CHOLECYSTOTOMY TUBE INSERTION;  Surgeon: Anda Bamberg, MD;  Location: [facility] OR;  Service: General;  Laterality: N/A;   NO PAST SURGERIES     TOOTH EXTRACTION      Family History  Problem Relation Age of Onset   Healthy Mother    Healthy Father     Social History   Tobacco Use   Smoking status: Never   Smokeless tobacco: Never  Vaping Use   Vaping status: Never Used  Substance Use Topics   Alcohol use: Never   Drug use: Never    Allergies: No Known Allergies  Medications Prior to Admission  Medication Sig  Dispense Refill Last Dose/Taking   acetaminophen  (TYLENOL ) 325 MG tablet Take 2 tablets (650 mg total) by mouth every 6 (six) hours. 30 tablet 2    amoxicillin -clavulanate (AUGMENTIN ) 875-125 MG tablet Take 1 tablet by mouth 2 (two) times daily for 10 days 20 tablet 0    cyclobenzaprine  (FLEXERIL ) 5 MG tablet Take 1 tablet (5 mg total) by mouth 3 (three) times daily. 30 tablet 0    ibuprofen  (ADVIL ) 800 MG tablet Take 1 tablet (800 mg total) by mouth every 8 (eight) hours as needed. 30 tablet 0    NIFEdipine  (ADALAT  CC) 30 MG 24 hr tablet Take 1 tablet (30 mg total) by mouth daily. 30 tablet 1    oxyCODONE  (OXY IR/ROXICODONE ) 5 MG immediate release tablet Take 1 tablet (5 mg total) by mouth every 4 (four) hours as needed for moderate pain (pain score 4-6) or severe pain (pain score 7-10). 20 tablet 0    Prenatal Vit-Fe Fumarate-FA (MULTIVITAMIN-PRENATAL) 27-0.8 MG TABS tablet Take 1 tablet by mouth daily at 12 noon.       Review of Systems  Gastrointestinal:  Positive for abdominal pain. Negative for nausea and vomiting.  Genitourinary:  Positive for vaginal bleeding.  Negative for difficulty urinating, dysuria and vaginal discharge.   Physical Exam   Blood pressure 112/69, pulse (!) 103, temperature 98 F (36.7 C), temperature source Oral, resp. rate 16, height 5\' 1"  (1.549 m), SpO2 100%, currently breastfeeding.  Physical Exam Vitals and nursing note reviewed.  Constitutional:      Appearance: Normal appearance.  HENT:     Head: Normocephalic and atraumatic.  Eyes:     Conjunctiva/sclera: Conjunctivae normal.  Cardiovascular:     Rate and Rhythm: Normal rate.  Pulmonary:     Effort: Pulmonary effort is normal. No respiratory distress.  Abdominal:     Comments: Tegaderm film and Gauze noted over cholecystostomy tube. Area of brownish yellow discoloration noted.  Dressing otherwise clean, dry. Film intact.   Musculoskeletal:        General: Normal range of motion.     Cervical back:  Normal range of motion.  Skin:    General: Skin is warm and dry.  Neurological:     Mental Status: She is alert and oriented to person, place, and time.  Psychiatric:        Mood and Affect: Mood normal.        Behavior: Behavior normal.     MAU Course  Procedures Results for orders placed or performed during the hospital encounter of 04/12/24 (from the past 24 hours)  CBC with Differential/Platelet     Status: Abnormal   Collection Time: 04/13/24  5:10 AM  Result Value Ref Range   WBC 5.5 4.0 - 10.5 K/uL   RBC 4.39 3.87 - 5.11 MIL/uL   Hemoglobin 13.3 12.0 - 15.0 g/dL   HCT 09.8 11.9 - 14.7 %   MCV 89.1 80.0 - 100.0 fL   MCH 30.3 26.0 - 34.0 pg   MCHC 34.0 30.0 - 36.0 g/dL   RDW 82.9 56.2 - 13.0 %   Platelets 561 (H) 150 - 400 K/uL   nRBC 0.0 0.0 - 0.2 %   Neutrophils Relative % 37 %   Neutro Abs 2.1 1.7 - 7.7 K/uL   Lymphocytes Relative 47 %   Lymphs Abs 2.6 0.7 - 4.0 K/uL   Monocytes Relative 10 %   Monocytes Absolute 0.5 0.1 - 1.0 K/uL   Eosinophils Relative 4 %   Eosinophils Absolute 0.2 0.0 - 0.5 K/uL   Basophils Relative 1 %   Basophils Absolute 0.1 0.0 - 0.1 K/uL   Immature Granulocytes 1 %   Abs Immature Granulocytes 0.03 0.00 - 0.07 K/uL  Comprehensive metabolic panel     Status: Abnormal   Collection Time: 04/13/24  5:10 AM  Result Value Ref Range   Sodium 137 135 - 145 mmol/L   Potassium 4.0 3.5 - 5.1 mmol/L   Chloride 105 98 - 111 mmol/L   CO2 25 22 - 32 mmol/L   Glucose, Bld 97 70 - 99 mg/dL   BUN 20 6 - 20 mg/dL   Creatinine, Ser 8.65 0.44 - 1.00 mg/dL   Calcium  9.5 8.9 - 10.3 mg/dL   Total Protein 7.9 6.5 - 8.1 g/dL   Albumin  4.1 3.5 - 5.0 g/dL   AST 47 (H) 15 - 41 U/L   ALT 60 (H) 0 - 44 U/L   Alkaline Phosphatase 139 (H) 38 - 126 U/L   Total Bilirubin 1.2 0.0 - 1.2 mg/dL   GFR, Estimated >78 >46 mL/min   Anion gap 7 5 - 15   CT ABDOMEN PELVIS W CONTRAST Result Date: 04/13/2024 CLINICAL DATA:  History of cholecystostomy tube placement.  History of hematoma. EXAM: CT ABDOMEN AND PELVIS WITH CONTRAST TECHNIQUE: Multidetector CT imaging of the abdomen and pelvis was performed using the standard protocol following bolus administration of intravenous contrast. RADIATION DOSE REDUCTION: This exam was performed according to the departmental dose-optimization program which includes automated exposure control, adjustment of the mA and/or kV according to patient size and/or use of iterative reconstruction technique. CONTRAST:  75mL OMNIPAQUE  IOHEXOL  350 MG/ML SOLN COMPARISON:  04/03/2024 FINDINGS: Lower chest: No significant pleural effusion or consolidative change. Hepatobiliary: Cholecystostomy tube is again noted. Gallbladder wall thickening measures up to 5 mm, image 51/6. Previously 5 mm. No calcified gallstones. No pericholecystic fluid or inflammation. No bile duct dilatation. Pancreas: Unremarkable. No pancreatic ductal dilatation or surrounding inflammatory changes. Spleen: Normal in size without focal abnormality. Adrenals/Urinary Tract: Normal adrenal glands. No nephrolithiasis, hydronephrosis or suspicious mass. Urinary bladder appears within normal limits. Stomach/Bowel: Stomach is nondistended. No pathologic dilatation of the large or small bowel loops. The appendix is visualized and appears normal. No bowel wall thickening or inflammation. Vascular/Lymphatic: Patent abdominal vascularity. Normal appearance of the abdominal aorta. No abdominopelvic adenopathy. Reproductive: Enlarged uterus consistent with postpartum state C-section defect noted within the anterior lower uterine segment. Endometrium measures 1.8 cm in thickness. Calcific calcified structure within the endometrium is identified which may reflect an old fibroid. Other: Previous left lower quadrant ventral abdominal wall hematoma along the superficial portions of the rectus muscle appears decreased in size and density measuring 4.7 x 2.0 cm with 44 Hounsfield units. On the previous  exam this measured 8.2 x 2.5 cm and measured 57 Hounsfield units. Previous left lower quadrant rectus sheath hematoma has resolved. No signs of pneumoperitoneum. Musculoskeletal: No acute or significant osseous findings. IMPRESSION: 1. Unchanged positioning of cholecystostomy tube. Gallbladder wall thickening measures up to 5 mm. No calcified gallstones. No pericholecystic fluid or inflammation. 2. Previous left lower quadrant ventral abdominal wall hematoma along the superficial portions of the rectus muscle appears decreased in size and density. Previous left lower quadrant rectus sheath hematoma has resolved. 3. Enlarged uterus consistent with postpartum state. Endometrium measures 1.8 cm in thickness. Correlate for any history of vaginal bleeding. Electronically Signed   By: Kimberley Penman M.D.   On: 04/13/2024 07:07     MDM Physical Exam Consult Labs: CBC/D, CMP CT Scan Assessment and Plan  23 year old Female S/P C/S; Postpartum State Cholecystostomy tube Leakage Abdominal Pain  -Reviewed POC with patient. -Exam performed and findings discussed.  -Informed that provider not familiar with drain and will need to contact surgery regarding further evaluation. -Patient offered and declines pain medication.  -General Surgery paged and awaiting phone call.   Jessica L Emly 04/13/2024, 3:00 AM   Reassessment (4:10 AM) -Paged GS x 2 with no call back.  -[facility] called and Dr. Candelaria Chaco advises to continue to try to contact GS.   *Further states that dressing can be changed with no concern for damage to anything.  *However, concern arises with cause for drainage after suction removed almost 2 weeks ago.  *Agreeable to transferring patient, to [facility], if necessary. *Confirms Davonna Estes is Development worker, international aid on call.  -Provider pages GS again and Dr. Davonna Estes returns call. *Advises patient be held until operating physician, who returns at 0700, able to evaluate. *Later sends mychart secure message requesting  CBC, CMP, and CT scan. -Nurse notified of POC.   Reassessment (8:20 AM) -Report given to L. Leftwich-Kirby, CNM -However, nurse reports general surgery has  seen patient and cleared her for discharge.  -Will assess pain and discharge accordingly.   Kraig Peru MSN, CNM Advanced Practice Provider, Center for [facility]   MDM:  Offered pt pain medication in MAU after eval by general surgery. Pt declined medication.  Samples of drain sponges sent with patient to do home dressing changes.  Pt to return to MAU as needed, keep scheduled f/u visits in office.  Arlester Bence, CNM 12:09 PM

## 2024-04-23 ENCOUNTER — Telehealth: Payer: Self-pay | Admitting: *Deleted

## 2024-04-23 NOTE — Telephone Encounter (Signed)
 Voicemail message received from Kathy Ganim - [facility] RN.  She stated that she had home visit w/pt on 6/3. Pt has been complaining of H/A every Clinton Wahlberg - off and on.  BP = 100/70. She further stated that pt's C/S incision looks great however, there was a scant amount of yellow drainage on the 4x4 covering her abdominal drain (from cholecystostomy) and oozing yellow drainage visualized.

## 2024-04-28 NOTE — Telephone Encounter (Signed)
 I called patient with Interpreter Priscilla Day. I explained I was following up from message home visit nurse had left. I asked if she was still having headaches and how she was doing. She states she still has headaches sometimes but she takes Ibuprofen  if her headache gets worse and the Ibuprofen  relieves her headache.   I advised if she has severe headache not relieved by ibuprofen  to go to hospital for evaluation for high blood pressure/ preeclampsia. I also asked if she was still having discharge at her wound. At first she said she is having very little yellowish discharge and doesn't have to wear dressing. She denies redness, or odor or fever.  Then she reported her hematoma was getting better but is a little worse. She said  has a little swelling but she thinks is ok. I advised to have front office schedule her a visit with provider ( she also had cholecysectomy tube insertion). She declined appointment. I advised if it gets worse to go to hospital for evaluation. I also reviewed her postpartum appointment next week on 6/18 and informed her it is very important for her to come. She voices understanding. Alejandra Hurst

## 2024-05-07 ENCOUNTER — Other Ambulatory Visit: Payer: Self-pay

## 2024-05-07 ENCOUNTER — Other Ambulatory Visit (HOSPITAL_COMMUNITY): Payer: Self-pay | Admitting: Surgery

## 2024-05-07 ENCOUNTER — Encounter: Payer: Self-pay | Admitting: Family Medicine

## 2024-05-07 ENCOUNTER — Ambulatory Visit: Payer: Self-pay | Admitting: Family Medicine

## 2024-05-07 DIAGNOSIS — Z9889 Other specified postprocedural states: Secondary | ICD-10-CM

## 2024-05-07 MED ORDER — NITROFURANTOIN MONOHYD MACRO 100 MG PO CAPS: 100.0000 mg | ORAL_CAPSULE | Freq: Two times a day (BID) | ORAL | 0 refills | Status: DC

## 2024-05-07 NOTE — Patient Instructions (Signed)
 It was a pleasure seeing you today.  I was able to contact [facility] and they have put a referral in to have your drain removed.  Someone should be calling either this week or next week to have this scheduled.  If you have any issues please let me know.  I sent a prescription for an antibiotic to treat your possible UTI.  If your symptoms do not resolve please let me know.  I hope you have a wonderful rest of your day.

## 2024-05-07 NOTE — Progress Notes (Unsigned)
    Post Partum Visit Note  Toni Klein is a 23 y.o. [phone] female who presents for a postpartum visit. She is 5.6 weeks postpartum following a primary cesarean section.  I have fully reviewed the prenatal and intrapartum course. The delivery was at 36.4 gestational weeks.  Anesthesia: spinal. Postpartum course has been okay. Baby is doing well. Baby is feeding by . Bleeding no bleeding. Bowel function is normal. Bladder function is normal. Patient is not sexually active. Contraception method is none. Postpartum depression screening: negative.   The pregnancy intention screening data noted above was reviewed. Potential methods of contraception were discussed. The patient elected to proceed with No data recorded.    Health Maintenance Due  Topic Date Due   COVID-19 Vaccine (1) Never done   HPV VACCINES (1 - 3-dose series) Never done   Meningococcal B Vaccine (1 of 2 - Standard) Never done   DTaP/Tdap/Td (1 - Tdap) Never done    {Common ambulatory SmartLinks:19316}  Review of Systems {ros; complete:30496}  Objective:  LMP 07/15/2023    General:  {gen appearance:16600}   Breasts:  {desc; normal/abnormal/not indicated:14647}  Lungs: {lung exam:16931}  Heart:  {heart exam:5510}  Abdomen: {abdomen exam:16834}   Wound {Wound assessment:11097}  GU exam:  {desc; normal/abnormal/not indicated:14647}       Assessment:    There are no diagnoses linked to this encounter.  *** postpartum exam.   Plan:   Essential components of care per ACOG recommendations:  1.  Mood and well being: Patient with {gen negative/positive:315881} depression screening today. Reviewed local resources for support.  - Patient tobacco use? {tobacco use:25506}  - hx of drug use? {yes/no:25505}    2. Infant care and feeding:  -Patient currently breastmilk feeding? {yes/no:25502}  -Social determinants of health (SDOH) reviewed in EPIC. No concerns***The following needs were  identified***  3. Sexuality, contraception and birth spacing - Patient {DOES_DOES ZHY:86578} want a pregnancy in the next year.  Desired family size is {NUMBER 1-10:22536} children.  - Reviewed reproductive life planning. Reviewed contraceptive methods based on pt preferences and effectiveness.  Patient desired {Upstream End Methods:24109} today.   - Discussed birth spacing of 18 months  4. Sleep and fatigue -Encouraged family/partner/community support of 4 hrs of uninterrupted sleep to help with mood and fatigue  5. Physical Recovery  - Discussed patients delivery and complications. She describes her labor as {description:25511} - Patient had a {CHL AMB [phone]}. Patient had a {laceration:25518} laceration. Perineal healing reviewed. Patient expressed understanding - Patient has urinary incontinence? {yes/no:25515} - Patient {ACTION; IS/IS ION:62952841} safe to resume physical and sexual activity  6.  Health Maintenance - HM due items addressed {Yes or If no, why not?:20788} - Last pap smear No results found for: DIAGPAP Pap smear {done:10129} at today's visit.  -Breast Cancer screening indicated? {indicated:25516}  7. Chronic Disease/Pregnancy Condition follow up: {Follow up:25499}  - PCP follow up  B'Aisha Derral Flick, CMA Center for [employer], [facility]

## 2024-05-15 ENCOUNTER — Ambulatory Visit (HOSPITAL_COMMUNITY): Payer: MEDICAID

## 2024-05-15 ENCOUNTER — Encounter (HOSPITAL_COMMUNITY): Payer: Self-pay

## 2024-05-19 ENCOUNTER — Ambulatory Visit (HOSPITAL_COMMUNITY)
Admission: RE | Admit: 2024-05-19 | Discharge: 2024-05-19 | Disposition: A | Discharge status: Home / Self Care | Payer: MEDICAID | Source: Ambulatory Visit | Attending: Surgery | Admitting: Surgery

## 2024-05-19 DIAGNOSIS — Z9889 Other specified postprocedural states: Secondary | ICD-10-CM

## 2024-05-19 DIAGNOSIS — Z434 Encounter for attention to other artificial openings of digestive tract: Secondary | ICD-10-CM | POA: Insufficient documentation

## 2024-05-19 HISTORY — PX: IR CHOLANGIOGRAM EXISTING TUBE: IMG6040

## 2024-05-19 MED ORDER — IOHEXOL 300 MG/ML  SOLN
50.0000 mL | Freq: Once | INTRAMUSCULAR | Status: AC | PRN
  Administered 2024-05-19: 15 mL

## 2024-05-28 ENCOUNTER — Inpatient Hospital Stay (HOSPITAL_COMMUNITY)
Admission: AD | Admit: 2024-05-28 | Discharge: 2024-05-29 | Disposition: A | Discharge status: Home / Self Care | Payer: Self-pay | Attending: Emergency Medicine | Admitting: Emergency Medicine

## 2024-05-28 DIAGNOSIS — R0602 Shortness of breath: Secondary | ICD-10-CM | POA: Insufficient documentation

## 2024-05-28 DIAGNOSIS — R1011 Right upper quadrant pain: Secondary | ICD-10-CM

## 2024-05-28 DIAGNOSIS — K9189 Other postprocedural complications and disorders of digestive system: Secondary | ICD-10-CM | POA: Insufficient documentation

## 2024-05-29 ENCOUNTER — Encounter (HOSPITAL_COMMUNITY): Payer: Self-pay | Admitting: Obstetrics and Gynecology

## 2024-05-29 ENCOUNTER — Inpatient Hospital Stay (HOSPITAL_COMMUNITY): Payer: Self-pay

## 2024-05-29 ENCOUNTER — Other Ambulatory Visit: Payer: Self-pay

## 2024-05-29 LAB — CBC WITH DIFFERENTIAL/PLATELET
Abs Immature Granulocytes: 0.01 K/uL (ref 0.00–0.07)
Basophils Absolute: 0.1 K/uL (ref 0.0–0.1)
Basophils Relative: 1 %
Eosinophils Absolute: 0.2 K/uL (ref 0.0–0.5)
Eosinophils Relative: 2 %
HCT: 37.7 % (ref 36.0–46.0)
Hemoglobin: 13 g/dL (ref 12.0–15.0)
Immature Granulocytes: 0 %
Lymphocytes Relative: 44 %
Lymphs Abs: 3.1 K/uL (ref 0.7–4.0)
MCH: 30 pg (ref 26.0–34.0)
MCHC: 34.5 g/dL (ref 30.0–36.0)
MCV: 86.9 fL (ref 80.0–100.0)
Monocytes Absolute: 0.5 K/uL (ref 0.1–1.0)
Monocytes Relative: 8 %
Neutro Abs: 3 K/uL (ref 1.7–7.7)
Neutrophils Relative %: 45 %
Platelets: 345 K/uL (ref 150–400)
RBC: 4.34 MIL/uL (ref 3.87–5.11)
RDW: 12 % (ref 11.5–15.5)
WBC: 6.9 K/uL (ref 4.0–10.5)
nRBC: 0 % (ref 0.0–0.2)

## 2024-05-29 LAB — COMPREHENSIVE METABOLIC PANEL WITH GFR
ALT: 51 U/L — ABNORMAL HIGH (ref 0–44)
AST: 33 U/L (ref 15–41)
Albumin: 4.1 g/dL (ref 3.5–5.0)
Alkaline Phosphatase: 67 U/L (ref 38–126)
Anion gap: 10 (ref 5–15)
BUN: 9 mg/dL (ref 6–20)
CO2: 22 mmol/L (ref 22–32)
Calcium: 9.4 mg/dL (ref 8.9–10.3)
Chloride: 106 mmol/L (ref 98–111)
Creatinine, Ser: 0.44 mg/dL (ref 0.44–1.00)
GFR, Estimated: >60 mL/min (ref >60)
Glucose, Bld: 117 mg/dL — ABNORMAL HIGH (ref 70–99)
Potassium: 3.4 mmol/L — ABNORMAL LOW (ref 3.5–5.1)
Sodium: 138 mmol/L (ref 135–145)
Total Bilirubin: 0.5 mg/dL (ref 0.0–1.2)
Total Protein: 7.6 g/dL (ref 6.5–8.1)

## 2024-05-29 MED ORDER — CEPHALEXIN 500 MG PO CAPS: 500.0000 mg | ORAL_CAPSULE | Freq: Four times a day (QID) | ORAL | 0 refills | Status: AC

## 2024-05-29 MED ORDER — IOHEXOL 350 MG/ML SOLN
75.0000 mL | Freq: Once | INTRAVENOUS | Status: AC | PRN
  Administered 2024-05-29: 75 mL via INTRAVENOUS

## 2024-05-29 NOTE — ED Triage Notes (Signed)
 The pt was sent for drainage from an abd tube that was placed  at [facility] 8 weeks ago when she delivered there.  The drain was placed for gallbladder and liver problems she speaks spanish

## 2024-05-29 NOTE — MAU Note (Signed)
 MAU Triage Note:  .Toni Klein is a 23 y.o. at Unknown here in MAU reporting: s/p C/S on 03/27/24 that was followed by cholecystectomy. She is complaining of yellow discharge from her cystotomy along with discomfort in the area. Patient was seen in triage by Trudy, CNM who determined to transfer patient to ED due to being over 6 weeks postpartum.   RN spoke to patient and informed that we would take her to the ED to be seen as MAU does not see patient's who are over 6 weeks postpartum. Patient verbalized understanding.   Patient complaint: PP possible infection from foley  Onset of complaint: today LMP: No LMP recorded.  Lab orders placed from triage: N/A

## 2024-05-29 NOTE — ED Notes (Signed)
 Pt transported to CT ?

## 2024-05-29 NOTE — ED Provider Notes (Signed)
 [place] [facility] Provider Note   CSN: [id number] Arrival date & time: 05/28/24  2345     Patient presents with: Abdominal Pain and discharge from drain   Toni Klein is a 23 y.o. female.   Pt came in due to pain and redness around her cholecystostomy tube. The tube was placed about 8 weeks ago during her delivery which had mutiple complications, one of them being an infected gallbladder. Pt said the for the past week she has observed redness around the tube and also increased pain with a max of 7/10 on the pain scale. She has also noticed increased green fluid draining from the tub--which is a lot more than what she used to drain initially. Pt denies any fevers, CP, pain in other abdominal regions, HA, blurry vision. She has been having some chills and SOB that is accompanied with the pain. Pt denies any nausea and vomiting. Pt said her tube was supposed to be removed in 6 weeks from placement but she was able to get an appointment for that only after 3 weeks--her appt date is July 24th. Pt has a PMH: DIC, HELLP, acute Cholecystitis.   Abdominal Pain      Prior to Admission medications   Medication Sig Start Date End Date Taking? Authorizing Provider  acetaminophen  (TYLENOL ) 325 MG tablet Take 2 tablets (650 mg total) by mouth every 6 (six) hours. Patient not taking: Reported on 05/07/2024 04/01/24   Fredirick Glenys RAMAN, MD  amoxicillin -clavulanate (AUGMENTIN ) 875-125 MG tablet Take 1 tablet by mouth 2 (two) times daily for 10 days Patient not taking: Reported on 05/07/2024 04/05/24   Jayne Vonn DEL, MD  cyclobenzaprine  (FLEXERIL ) 5 MG tablet Take 1 tablet (5 mg total) by mouth 3 (three) times daily. Patient not taking: Reported on 05/07/2024 04/01/24   Fredirick Glenys RAMAN, MD  ibuprofen  (ADVIL ) 800 MG tablet Take 1 tablet (800 mg total) by mouth every 8 (eight) hours as needed. 04/05/24   Jayne Vonn DEL, MD  NIFEdipine  (ADALAT  CC) 30 MG 24 hr  tablet Take 1 tablet (30 mg total) by mouth daily. Patient not taking: Reported on 05/07/2024 04/02/24   Fredirick Glenys RAMAN, MD  nitrofurantoin , macrocrystal-monohydrate, (MACROBID ) 100 MG capsule Take 1 capsule (100 mg total) by mouth 2 (two) times daily. 05/07/24   Cresenzo, Norleen GAILS, MD  oxyCODONE  (OXY IR/ROXICODONE ) 5 MG immediate release tablet Take 1 tablet (5 mg total) by mouth every 4 (four) hours as needed for moderate pain (pain score 4-6) or severe pain (pain score 7-10). Patient not taking: Reported on 05/07/2024 04/05/24   Jayne Vonn DEL, MD  Prenatal Vit-Fe Fumarate-FA (MULTIVITAMIN-PRENATAL) 27-0.8 MG TABS tablet Take 1 tablet by mouth daily at 12 noon.    [provider]    Allergies: Patient has no known allergies.    Review of Systems  Constitutional:        All pertinent ROS in HPI  Gastrointestinal:  Positive for abdominal pain.    Updated Vital Signs BP 111/84   Pulse 82   Temp 98.3 F (36.8 C) (Oral)   Resp 16   Ht 5' 1 (1.549 m)   Wt 50.4 kg   SpO2 100%   Breastfeeding Unknown   BMI 20.99 kg/m   Physical Exam Constitutional:      Appearance: She is well-developed.  HENT:     Head: Normocephalic.  Cardiovascular:     Rate and Rhythm: Normal rate and regular rhythm.  Heart sounds: Normal heart sounds.  Pulmonary:     Effort: Pulmonary effort is normal.     Breath sounds: Normal breath sounds.  Abdominal:     Palpations: Abdomen is soft.     Tenderness: There is no guarding.     Comments: Cholecystostomy tube images are saved in the pt's chart. Region around the tube is erythematous with minimal dried puss stuck to the tube. Tube is not draining fluid at the moment. Her dressing was dry. Pt was tender on palpation around the region of the tube.  Neurological:     Mental Status: She is alert and oriented to person, place, and time.     (all labs ordered are listed, but only abnormal results are displayed) Labs Reviewed  COMPREHENSIVE METABOLIC  PANEL WITH GFR - Abnormal; Notable for the following components:      Result Value   Potassium 3.4 (*)    Glucose, Bld 117 (*)    ALT 51 (*)    All other components within normal limits  CBC WITH DIFFERENTIAL/PLATELET    EKG: None  Radiology: No results found.   Procedures   Medications Ordered in the ED - No data to display                                  Medical Decision Making This patient presents to the ED for concern of potential infected region around her cholecystostomy tube, this involves an extensive number of treatment options, and is a complaint that carries with it a high risk of complications and morbidity.  The differential diagnosis includes cholecystitis, abdominal infection, cellulitis   Co morbidities that complicate the patient evaluation  DIC, HELLP, acute Cholecystitis.  Lab Tests:  Imaging Studies ordered: CT abdomen and pelvis  I independently visualized and interpreted imaging which showed no acute findings in the abdomen and pelvis and no signs of cholecystitis. I agree with the radiologist interpretation   I requested consultation with the surgeon,  and discussed lab and imaging findings as well as pertinent plan - they recommend: the pt get her cholecystostomy tube removed soon. Move up the current appt on July 24th to an earlier date with [facility]   Problem List / ED Course:  Pt was seen for redness and pain around her cholecystostomy tube. She was afebrile and normotensive. On our physical exam, we observed erythema and tenderness around the cholecystostomy tube. Pt appeared tired. We got a CT abdomen and pelvis which showed no signs of infection or acute findings in the abdomen and pelvis. Her labs were unremarkable.    Reevaluation:  After the interventions noted above, I reevaluated the patient and found that the patient was still in pain. But pain got worse when she stood up and walked. We came to the conclusion that  the cholecystostomy tube was irritating the area and that she would have a relief of her pain and other symptoms once she got it removed.     Dispostion:  After consideration of the diagnostic results and the patients response to treatment, I feel that the patent would benefit from being discharged and f f/u with [facility] to get an earlier appointment date for her cholecystostomy tube removed before July 24th--which is her current appt date. Pt was discharged with Keflex  for 7 days to treat for any potential infections.    Amount and/or Complexity of Data Reviewed Radiology: ordered.  Risk Prescription drug management.      Final diagnoses:  None    ED Discharge Orders     None          Edgardo Pontiff, DO 05/29/24 0542    Griselda Norris, MD 05/30/24 [phone]

## 2024-05-29 NOTE — MAU Provider Note (Signed)
 9 weeks postpartum  >> Transferred to ED due to being so remote from Delivery (9+weeks) C/S 5/8  MAU Triage RN Note:  MAU Triage Note:  .Toni Klein is a 23 y.o. at Unknown here in MAU reporting: s/p C/S on 03/27/24 that was followed by cholecystectomy. She is complaining of yellow discharge from her cystotomy along with discomfort in the area. Patient was seen in triage by Trudy, CNM who determined to transfer patient to ED due to being over 6 weeks postpartum.    RN spoke to patient and informed that we would take her to the ED to be seen as MAU does not see patient's who are over 6 weeks postpartum. Patient verbalized understanding.    Patient complaint: PP possible infection from foley   Onset of complaint: today

## 2024-05-29 NOTE — Discharge Instructions (Addendum)
--  You were seen for redness and pain around your gallbladder tube placement in the abdomen. --You CT scan was negative for infection in the gallbladder and in the abdomen --Please f/u with [facility] to get the gallbladder tube out as soon as possible and before July 24th --Take prescribed dose of amoxicillin  for 7 days --Go to an ED if signs if symptoms develop such as fever, uncontrollable pain, feeling of passing out.

## 2024-05-29 NOTE — MAU Note (Signed)
Patient transferred to ED via wheelchair.

## 2024-06-16 ENCOUNTER — Ambulatory Visit (HOSPITAL_COMMUNITY)
Admission: EM | Admit: 2024-06-16 | Discharge: 2024-06-16 | Disposition: A | Discharge status: Home / Self Care | Payer: Self-pay | Attending: Emergency Medicine | Admitting: Emergency Medicine

## 2024-06-16 ENCOUNTER — Emergency Department (HOSPITAL_COMMUNITY): Payer: Self-pay | Admitting: Anesthesiology

## 2024-06-16 ENCOUNTER — Ambulatory Visit: Admit: 2024-06-16 | Payer: Self-pay | Admitting: Surgery

## 2024-06-16 ENCOUNTER — Other Ambulatory Visit: Payer: Self-pay

## 2024-06-16 ENCOUNTER — Encounter (HOSPITAL_COMMUNITY): Payer: Self-pay

## 2024-06-16 ENCOUNTER — Encounter (HOSPITAL_COMMUNITY)
Admission: EM | Disposition: A | Discharge status: Home / Self Care | Payer: Self-pay | Source: Home / Self Care | Attending: Emergency Medicine

## 2024-06-16 DIAGNOSIS — K812 Acute cholecystitis with chronic cholecystitis: Secondary | ICD-10-CM | POA: Insufficient documentation

## 2024-06-16 DIAGNOSIS — K810 Acute cholecystitis: Secondary | ICD-10-CM

## 2024-06-16 DIAGNOSIS — I10 Essential (primary) hypertension: Secondary | ICD-10-CM | POA: Insufficient documentation

## 2024-06-16 DIAGNOSIS — Z79899 Other long term (current) drug therapy: Secondary | ICD-10-CM | POA: Insufficient documentation

## 2024-06-16 HISTORY — PX: CHOLECYSTECTOMY: SHX55

## 2024-06-16 LAB — COMPREHENSIVE METABOLIC PANEL WITH GFR
ALT: 43 U/L (ref 0–44)
AST: 30 U/L (ref 15–41)
Albumin: 3.9 g/dL (ref 3.5–5.0)
Alkaline Phosphatase: 73 U/L (ref 38–126)
Anion gap: 13 (ref 5–15)
BUN: 9 mg/dL (ref 6–20)
CO2: 19 mmol/L — ABNORMAL LOW (ref 22–32)
Calcium: 9.3 mg/dL (ref 8.9–10.3)
Chloride: 103 mmol/L (ref 98–111)
Creatinine, Ser: 0.42 mg/dL — ABNORMAL LOW (ref 0.44–1.00)
GFR, Estimated: >60 mL/min (ref >60)
Glucose, Bld: 93 mg/dL (ref 70–99)
Potassium: 3.6 mmol/L (ref 3.5–5.1)
Sodium: 135 mmol/L (ref 135–145)
Total Bilirubin: 0.2 mg/dL (ref 0.0–1.2)
Total Protein: 7.4 g/dL (ref 6.5–8.1)

## 2024-06-16 LAB — CBC
HCT: 38.8 % (ref 36.0–46.0)
Hemoglobin: 13.7 g/dL (ref 12.0–15.0)
MCH: 30.4 pg (ref 26.0–34.0)
MCHC: 35.3 g/dL (ref 30.0–36.0)
MCV: 86.2 fL (ref 80.0–100.0)
Platelets: 264 K/uL (ref 150–400)
RBC: 4.5 MIL/uL (ref 3.87–5.11)
RDW: 11.8 % (ref 11.5–15.5)
WBC: 7.1 K/uL (ref 4.0–10.5)
nRBC: 0 % (ref 0.0–0.2)

## 2024-06-16 LAB — LIPASE, BLOOD: Lipase: 26 U/L (ref 11–51)

## 2024-06-16 LAB — HCG, SERUM, QUALITATIVE: Preg, Serum: NEGATIVE

## 2024-06-16 SURGERY — LAPAROSCOPIC CHOLECYSTECTOMY
Anesthesia: General | Site: Abdomen

## 2024-06-16 MED ORDER — MEPERIDINE HCL 25 MG/ML IJ SOLN: 6.2500 mg | INTRAMUSCULAR | Status: DC | PRN

## 2024-06-16 MED ORDER — OXYCODONE HCL 5 MG/5ML PO SOLN
5.0000 mg | Freq: Once | ORAL | Status: AC | PRN
  Administered 2024-06-16: 5 mg via ORAL

## 2024-06-16 MED ORDER — MIDAZOLAM HCL 2 MG/2ML IJ SOLN
INTRAMUSCULAR | Status: DC | PRN
  Administered 2024-06-16: 2 mg via INTRAVENOUS

## 2024-06-16 MED ORDER — 0.9 % SODIUM CHLORIDE (POUR BTL) OPTIME
TOPICAL | Status: DC | PRN
  Administered 2024-06-16: 1000 mL

## 2024-06-16 MED ORDER — OXYCODONE HCL 5 MG PO TABS: 5.0000 mg | ORAL_TABLET | ORAL | 0 refills | Status: DC | PRN

## 2024-06-16 MED ORDER — OXYCODONE HCL 5 MG PO TABS: 5.0000 mg | ORAL_TABLET | Freq: Once | ORAL | Status: AC | PRN

## 2024-06-16 MED ORDER — SUGAMMADEX SODIUM 200 MG/2ML IV SOLN
INTRAVENOUS | Status: DC | PRN
  Administered 2024-06-16: 200 mg via INTRAVENOUS

## 2024-06-16 MED ORDER — CHLORHEXIDINE GLUCONATE CLOTH 2 % EX PADS: 6.0000 | MEDICATED_PAD | Freq: Once | CUTANEOUS | Status: DC

## 2024-06-16 MED ORDER — BUPIVACAINE LIPOSOME 1.3 % IJ SUSP
INTRAMUSCULAR | Status: DC | PRN
  Administered 2024-06-16: 40 mL via INTRAMUSCULAR

## 2024-06-16 MED ORDER — PROPOFOL 10 MG/ML IV BOLUS
INTRAVENOUS | Status: DC | PRN
  Administered 2024-06-16: 200 mg via INTRAVENOUS

## 2024-06-16 MED ORDER — OXYCODONE HCL 5 MG PO TABS: 5.0000 mg | ORAL_TABLET | Freq: Once | ORAL | Status: DC | PRN

## 2024-06-16 MED ORDER — BUPIVACAINE LIPOSOME 1.3 % IJ SUSP
INTRAMUSCULAR | Status: AC
  Filled 2024-06-16: qty 20

## 2024-06-16 MED ORDER — LACTATED RINGERS IV SOLN: INTRAVENOUS | Status: DC

## 2024-06-16 MED ORDER — FENTANYL CITRATE (PF) 250 MCG/5ML IJ SOLN
INTRAMUSCULAR | Status: DC | PRN
  Administered 2024-06-16: 100 ug via INTRAVENOUS
  Administered 2024-06-16: 50 ug via INTRAVENOUS

## 2024-06-16 MED ORDER — LIDOCAINE 2% (20 MG/ML) 5 ML SYRINGE
INTRAMUSCULAR | Status: DC | PRN
  Administered 2024-06-16: 60 mg via INTRAVENOUS

## 2024-06-16 MED ORDER — DEXMEDETOMIDINE HCL IN NACL 80 MCG/20ML IV SOLN
INTRAVENOUS | Status: DC | PRN
  Administered 2024-06-16: 8 ug via INTRAVENOUS

## 2024-06-16 MED ORDER — MIDAZOLAM HCL 2 MG/2ML IJ SOLN
INTRAMUSCULAR | Status: AC
Start: 2024-06-16 — End: 2024-06-16
  Filled 2024-06-16: qty 2

## 2024-06-16 MED ORDER — ACETAMINOPHEN 500 MG PO TABS: 1000.0000 mg | ORAL_TABLET | Freq: Four times a day (QID) | ORAL | 3 refills | Status: DC

## 2024-06-16 MED ORDER — CHLORHEXIDINE GLUCONATE 0.12 % MT SOLN: 15.0000 mL | Freq: Once | OROMUCOSAL | Status: AC

## 2024-06-16 MED ORDER — FENTANYL CITRATE (PF) 100 MCG/2ML IJ SOLN
25.0000 ug | INTRAMUSCULAR | Status: DC | PRN
  Administered 2024-06-16: 25 ug via INTRAVENOUS
  Administered 2024-06-16: 50 ug via INTRAVENOUS
  Administered 2024-06-16: 25 ug via INTRAVENOUS

## 2024-06-16 MED ORDER — ONDANSETRON HCL 4 MG/2ML IJ SOLN: 4.0000 mg | Freq: Once | INTRAMUSCULAR | Status: DC | PRN

## 2024-06-16 MED ORDER — CHLORHEXIDINE GLUCONATE 0.12 % MT SOLN
OROMUCOSAL | Status: AC
  Administered 2024-06-16: 15 mL via OROMUCOSAL
  Filled 2024-06-16: qty 15

## 2024-06-16 MED ORDER — FENTANYL CITRATE (PF) 100 MCG/2ML IJ SOLN: 25.0000 ug | INTRAMUSCULAR | Status: DC | PRN

## 2024-06-16 MED ORDER — CEFAZOLIN SODIUM-DEXTROSE 2-4 GM/100ML-% IV SOLN
2.0000 g | INTRAVENOUS | Status: AC
  Administered 2024-06-16: 2 g via INTRAVENOUS
  Filled 2024-06-16: qty 100

## 2024-06-16 MED ORDER — ORAL CARE MOUTH RINSE: 15.0000 mL | Freq: Once | OROMUCOSAL | Status: AC

## 2024-06-16 MED ORDER — FENTANYL CITRATE (PF) 250 MCG/5ML IJ SOLN
INTRAMUSCULAR | Status: AC
Start: 2024-06-16 — End: 2024-06-16
  Filled 2024-06-16: qty 5

## 2024-06-16 MED ORDER — IBUPROFEN 600 MG PO TABS: 600.0000 mg | ORAL_TABLET | Freq: Three times a day (TID) | ORAL | 1 refills | Status: DC | PRN

## 2024-06-16 MED ORDER — DEXAMETHASONE SODIUM PHOSPHATE 10 MG/ML IJ SOLN
INTRAMUSCULAR | Status: DC | PRN
  Administered 2024-06-16: 10 mg via INTRAVENOUS

## 2024-06-16 MED ORDER — ONDANSETRON HCL 4 MG/2ML IJ SOLN
INTRAMUSCULAR | Status: DC | PRN
  Administered 2024-06-16: 4 mg via INTRAVENOUS

## 2024-06-16 MED ORDER — CEFAZOLIN SODIUM-DEXTROSE 2-4 GM/100ML-% IV SOLN
INTRAVENOUS | Status: AC
  Filled 2024-06-16: qty 100

## 2024-06-16 MED ORDER — POLYETHYLENE GLYCOL 3350 17 G PO PACK: 17.0000 g | PACK | Freq: Every day | ORAL | 1 refills | Status: DC

## 2024-06-16 MED ORDER — BUPIVACAINE HCL (PF) 0.25 % IJ SOLN
INTRAMUSCULAR | Status: AC
  Filled 2024-06-16: qty 30

## 2024-06-16 MED ORDER — ROCURONIUM BROMIDE 10 MG/ML (PF) SYRINGE
PREFILLED_SYRINGE | INTRAVENOUS | Status: DC | PRN
  Administered 2024-06-16: 50 mg via INTRAVENOUS

## 2024-06-16 MED ORDER — METHOCARBAMOL 750 MG PO TABS: 750.0000 mg | ORAL_TABLET | Freq: Four times a day (QID) | ORAL | 1 refills | Status: DC

## 2024-06-16 MED ORDER — OXYCODONE HCL 5 MG/5ML PO SOLN: 5.0000 mg | Freq: Once | ORAL | Status: DC | PRN

## 2024-06-16 MED ORDER — SODIUM CHLORIDE 0.9 % IV SOLN: INTRAVENOUS | Status: DC

## 2024-06-16 MED ORDER — FENTANYL CITRATE (PF) 100 MCG/2ML IJ SOLN
INTRAMUSCULAR | Status: AC
  Filled 2024-06-16: qty 2

## 2024-06-16 MED ORDER — OXYCODONE HCL 5 MG/5ML PO SOLN
ORAL | Status: AC
  Filled 2024-06-16: qty 5

## 2024-06-16 SURGICAL SUPPLY — 41 items
BAG COUNTER SPONGE SURGICOUNT (BAG) ×2 IMPLANT
BLADE CLIPPER SURG (BLADE) IMPLANT
CANISTER SUCTION 3000ML PPV (SUCTIONS) ×4 IMPLANT
CHLORAPREP W/TINT 26 (MISCELLANEOUS) ×4 IMPLANT
CLIP APPLIE 5 13 M/L LIGAMAX5 (MISCELLANEOUS) ×4 IMPLANT
COVER MAYO STAND STRL (DRAPES) IMPLANT
COVER SURGICAL LIGHT HANDLE (MISCELLANEOUS) ×4 IMPLANT
DERMABOND ADVANCED .7 DNX12 (GAUZE/BANDAGES/DRESSINGS) ×3 IMPLANT
DISSECTOR BLUNT TIP ENDO 5MM (MISCELLANEOUS) ×2 IMPLANT
DRAPE C-ARM 42X120 X-RAY (DRAPES) IMPLANT
DRESSING MEPILEX FLEX 4X4 (GAUZE/BANDAGES/DRESSINGS) ×1 IMPLANT
ELECT CAUTERY BLADE 6.4 (BLADE) ×2 IMPLANT
ELECTRODE REM PT RTRN 9FT ADLT (ELECTROSURGICAL) ×4 IMPLANT
GLOVE BIO SURGEON STRL SZ 6.5 (GLOVE) ×4 IMPLANT
GLOVE BIOGEL PI IND STRL 6 (GLOVE) ×4 IMPLANT
GOWN STRL REUS W/ TWL LRG LVL3 (GOWN DISPOSABLE) ×12 IMPLANT
IRRIGATION SUCT STRKRFLW 2 WTP (MISCELLANEOUS) IMPLANT
KIT BASIN OR (CUSTOM PROCEDURE TRAY) ×4 IMPLANT
KIT IMAGING PINPOINTPAQ (MISCELLANEOUS) IMPLANT
KIT TURNOVER KIT B (KITS) ×4 IMPLANT
NS IRRIG 1000ML POUR BTL (IV SOLUTION) ×4 IMPLANT
PAD ARMBOARD POSITIONER FOAM (MISCELLANEOUS) ×4 IMPLANT
PENCIL BUTTON HOLSTER BLD 10FT (ELECTRODE) ×2 IMPLANT
POUCH RETRIEVAL ECOSAC 10 (ENDOMECHANICALS) ×2 IMPLANT
SCISSORS LAP 5X35 DISP (ENDOMECHANICALS) ×4 IMPLANT
SET CHOLANGIOGRAPH 5 50 .035 (SET/KITS/TRAYS/PACK) IMPLANT
SET TUBE SMOKE EVAC HIGH FLOW (TUBING) ×4 IMPLANT
SLEEVE Z-THREAD 5X100MM (TROCAR) ×8 IMPLANT
SPECIMEN JAR SMALL (MISCELLANEOUS) ×2 IMPLANT
SUT MNCRL AB 4-0 PS2 18 (SUTURE) ×4 IMPLANT
SUT VIC AB 0 UR5 27 (SUTURE) IMPLANT
SUT VICRYL 0 AB UR-6 (SUTURE) IMPLANT
SUT VICRYL 0 UR6 27IN ABS (SUTURE) ×2 IMPLANT
SYSTEM BAG RETRIEVAL 10MM (BASKET) IMPLANT
TOWEL GREEN STERILE (TOWEL DISPOSABLE) ×2 IMPLANT
TOWEL GREEN STERILE FF (TOWEL DISPOSABLE) ×4 IMPLANT
TRAY LAPAROSCOPIC MC (CUSTOM PROCEDURE TRAY) ×4 IMPLANT
TROCAR BALLN 12MMX100 BLUNT (TROCAR) ×4 IMPLANT
TROCAR Z-THREAD OPTICAL 5X100M (TROCAR) ×4 IMPLANT
WARMER LAPAROSCOPE (MISCELLANEOUS) ×4 IMPLANT
WATER STERILE IRR 1000ML POUR (IV SOLUTION) ×4 IMPLANT

## 2024-06-16 NOTE — ED Triage Notes (Signed)
 Pt arrives POV c/o abdominal pain x a couple weeks, she had a cholecystostomy tube placed on 03/27/24 and had a follow up last week. She was told to come in today if it still hurt for a possible lap chole.

## 2024-06-16 NOTE — Discharge Instructions (Addendum)
 [facility]UGA DE [facility]  CIRUGA LAPAROSCPICA: INSTRUCCIONES POST OPERACIONALES Revise siempre la hoja de instrucciones de alta que le entreg el centro donde se realiz la ciruga. SI TIENE FORMULARIOS DE LICENCIA POR DISCAPACIDAD O FAMILIAR, DEBE TRAERLOS A LA OFICINA PARA SU PROCESAMIENTO. NO SE LOS D A SU MDICO.  CONTROL DE DOLOR  1. Rgimen del dolor: tome tylenol  (acetaminofn) de venta libre 1000 mg cada seis horas, ibuprofeno recetado (600 mg) cada seis horas y robaxina (metocarbamol) 750 mg cada seis horas. Con los tres, deberas tomar [employer]. Ejemplo: tylenol  (paracetamol) a las 8 a.m., ibuprofeno a las 10 a.m., robaxina (metocarbamol) a las 12 p.m., tylenol  (acetaminofn) nuevamente a las 2 p.m., ibuprofeno nuevamente a las 4 p.m., robaxina (metocarbamol) a las 6 p.m. Tambin tiene receta mdica para oxicodona, que debe tomar si el tylenol  (acetaminofn), el ibuprofeno y la robaxina (metocarbamol) no son suficientes para Human resources officer. Puede tomar oxicodona con tanta frecuencia como cada cuatro horas, segn sea necesario, pero si est tomando los otros medicamentos como se indic anteriormente, no debera necesitar oxicodona con tanta frecuencia. Tambin le han recetado miralax , que es un ablandador de heces. Tmelo segn lo prescrito porque la oxicodona puede causar estreimiento y miralax  minimizar o Chief Strategy Officer. No conduzca mientras est tomando o bajo la influencia de oxicodona, ya que es un medicamento narctico. 2. Utilice compresas de hielo para ayudar a Human resources officer. 3. Si necesita un resurtido de su analgsico, comunquese con su farmacia. Se comunicarn con nuestra oficina para solicitar autorizacin. Las recetas no se surtirn despus de las 5 p. m. ni los fines de [place].  MEDICAMENTOS DOMSTICOS 4. Tome los medicamentos que le recetan habitualmente, a menos que se le indique lo contrario.  DIETA 5. Conviene seguir una  dieta ligera los primeros das tras la llegada a casa. Asegrese de incluir muchos lquidos diariamente.  CONSTIPACIN 6. Es comn experimentar algo de estreimiento despus de la ciruga y si est tomando analgsicos. Aumentar la ingesta de lquidos y tomar un ablandador de heces (como Colace) generalmente ayudar o evitar que ocurra este problema. Se debe tomar un laxante suave (Leche de Magnesia o Miralax ) de acuerdo con las instrucciones del paquete si no hay deposiciones despus de 48 horas.  CUIDADO DE HERIDAS/INCISIONES 7. La mayora de los pacientes experimentarn algo de hinchazn y hematomas en el rea de las incisiones. Las bolsas de hielo ayudarn. La hinchazn y los hematomas pueden tardar [employer]. 8. Lluvia de mayo a partir de 29 de julio 2025. 9. No retire ni frote el pegamento para la piel. Puede permitir que agua tibia y jabn corra sobre la incisin, luego enjuagar y Haematologist. 10. No se sumerja en agua (baeras, jacuzzis, piscinas, lagos, ocanos) durante [employer].  ACTIVIDADES 11. Puede reanudar las actividades diarias habituales (ligeras) a Glass blower/designer del da siguiente, como el cuidado personal diario, Advertising account planner, subir escaleras, aumentando gradualmente las actividades segn las [place]. Puede tener relaciones sexuales cuando le resulte cmodo. 12. No levantar ms de 5 libras durante seis semanas. 13. Puede conducir cuando ya no est tomando analgsicos narcticos, puede usar cmodamente el cinturn de seguridad y puede maniobrar su automvil y Contractor los frenos con seguridad.  HACER UN SEGUIMIENTO 14. Debe consultar a su mdico en el consultorio para una cita de seguimiento aproximadamente 2 a 3 semanas despus de la [place]. Se le debera haber dado su cita postoperatoria/de seguimiento cuando se program su ciruga. Si no recibi [employer]  cita postoperatoria/de seguimiento, asegrese de llamar para programar esta cita dentro de uno o [employer] de llegar a casa  para asegurar una cita conveniente.  CUNDO LLAMAR A SU MDICO: 1. Fiebre superior a 101,5 2. Incapacidad para orinar 3. Sangrado continuo de la incisin. 4. Aumento del dolor, enrojecimiento o drenaje de la incisin. 5. Dolor abdominal creciente  El personal de la clnica est disponible para responder sus preguntas durante el horario comercial habitual. No dude en llamar y pedir hablar con una de las enfermeras si tiene inquietudes clnicas. Si tiene [employer], vaya a la sala de emergencias ms cercana o llame al 911. Rolanda rutha everitt [facility] siempre est de [facility].  [address], [address], [place], [place]  [address] ? [address] [address], [place], [place]   [address] [phone] ? [phone] ? FAX [phone] Web site: [email or web address]

## 2024-06-16 NOTE — Transfer of Care (Signed)
 Immediate Anesthesia Transfer of Care Note  Patient: Toni Klein  Procedure(s) Performed: LAPAROSCOPIC CHOLECYSTECTOMY (Abdomen)  Patient Location: PACU  Anesthesia Type:General  Level of Consciousness: drowsy  Airway & Oxygen Therapy: Patient Spontanous Breathing and Patient connected to face mask oxygen  Post-op Assessment: Report given to RN and Post -op Vital signs reviewed and stable  Post vital signs: Reviewed and stable  Last Vitals:  Vitals Value Taken Time  BP 126/87 06/16/24 16:00  Temp    Pulse 94 06/16/24 16:01  Resp 31 06/16/24 16:01  SpO2 97 % 06/16/24 16:01  Vitals shown include unfiled device data.  Last Pain:  Vitals:   06/16/24 1423  TempSrc:   PainSc: 0-No pain         Complications: No notable events documented.

## 2024-06-16 NOTE — Anesthesia Postprocedure Evaluation (Signed)
 Anesthesia Post Note  Patient: Citlally Abigail Alana Laster  Procedure(s) Performed: LAPAROSCOPIC CHOLECYSTECTOMY (Abdomen)     Patient location during evaluation: PACU Anesthesia Type: General Level of consciousness: awake and alert Pain management: pain level controlled Vital Signs Assessment: post-procedure vital signs reviewed and stable Respiratory status: spontaneous breathing, nonlabored ventilation, respiratory function stable and patient connected to nasal cannula oxygen Cardiovascular status: blood pressure returned to baseline and stable Postop Assessment: no apparent nausea or vomiting Anesthetic complications: no   No notable events documented.  Last Vitals:  Vitals:   06/16/24 1630 06/16/24 1645  BP: (!) 129/97 126/84  Pulse: 96 88  Resp: (!) 21 13  Temp:    SpO2: 99% 94%    Last Pain:  Vitals:   06/16/24 1630  TempSrc:   PainSc: 8                  Eulis Salazar

## 2024-06-16 NOTE — Anesthesia Procedure Notes (Signed)
 Procedure Name: Intubation Date/Time: 06/16/2024 3:02 PM  Performed by: Elby Raelene SAUNDERS, CRNAPre-anesthesia Checklist: Patient identified, Emergency Drugs available, Suction available and Patient being monitored Patient Re-evaluated:Patient Re-evaluated prior to induction Oxygen Delivery Method: Circle System Utilized Preoxygenation: Pre-oxygenation with 100% oxygen Induction Type: IV induction Ventilation: Mask ventilation without difficulty Laryngoscope Size: Miller and 2 Grade View: Grade I Tube type: Oral Tube size: 7.0 mm Number of attempts: 1 Airway Equipment and Method: Stylet Placement Confirmation: ETT inserted through vocal cords under direct vision, positive ETCO2 and breath sounds checked- equal and bilateral Secured at: 21 cm Tube secured with: Tape Dental Injury: Teeth and Oropharynx as per pre-operative assessment

## 2024-06-16 NOTE — H&P (Addendum)
 Toni Klein is an 23 y.o. female.   HPI: 45F with concern for gangrenous cholecystitis who underwent open placement of a cholecystostomy tube 03/27/24 at the time of CS. Plan for lap chole today given the high likelihood of biliocutaneous fistula. Seen in the office this past Thursday, having leaking from cholecystostomy tube after attempts at removal in the office.   Past Medical History:  Diagnosis Date   Blood transfusion without reported diagnosis 2025   DIC (disseminated intravascular coagulation) (HCC)    Ectopic pregnancy    Methotrexate    HELLP (hemolytic anemia/elev liver enzymes/low platelets in pregnancy) 2025   Hemolysis, elevated liver enzymes, and low platelet (HELLP) syndrome during pregnancy, antepartum 03/27/2024    Past Surgical History:  Procedure Laterality Date   CESAREAN SECTION N/A 03/27/2024   Procedure: CESAREAN DELIVERY;  Surgeon: Cris Burnard DEL, MD;  Location: [facility] OR;  Service: Obstetrics;  Laterality: N/A;   EXAM UNDER ANESTHESIA, PELVIC N/A 03/27/2024   Procedure: EXAM UNDER ANESTHESIA, ABDOMINAL WITH CHOLECYSTOTOMY TUBE INSERTION;  Surgeon: Paola Dreama SAILOR, MD;  Location: [facility] OR;  Service: General;  Laterality: N/A;   IR CHOLANGIOGRAM EXISTING TUBE  05/19/2024   NO PAST SURGERIES     TOOTH EXTRACTION      Family History  Problem Relation Age of Onset   Healthy Mother    Healthy Father     Social History:  reports that she has never smoked. She has never used smokeless tobacco. She reports that she does not drink alcohol and does not use drugs.  Allergies: No Known Allergies  Medications: I have reviewed the patient's current medications.  Results for orders placed or performed during the hospital encounter of 06/16/24 (from the past 48 hours)  Lipase, blood     Status: None   Collection Time: 06/16/24  7:48 AM  Result Value Ref Range   Lipase 26 11 - 51 U/L    Comment: Performed at [facility], [address]  [place], [place] [address]  Comprehensive metabolic panel with GFR     Status: Abnormal   Collection Time: 06/16/24  7:48 AM  Result Value Ref Range   Sodium 135 135 - 145 mmol/L   Potassium 3.6 3.5 - 5.1 mmol/L   Chloride 103 98 - 111 mmol/L   CO2 19 (L) 22 - 32 mmol/L   Glucose, Bld 93 70 - 99 mg/dL    Comment: Glucose reference range applies only to samples taken after fasting for at least 8 hours.   BUN 9 6 - 20 mg/dL   Creatinine, Ser 9.57 (L) 0.44 - 1.00 mg/dL   Calcium  9.3 8.9 - 10.3 mg/dL   Total Protein 7.4 6.5 - 8.1 g/dL   Albumin  3.9 3.5 - 5.0 g/dL   AST 30 15 - 41 U/L   ALT 43 0 - 44 U/L   Alkaline Phosphatase 73 38 - 126 U/L   Total Bilirubin 0.2 0.0 - 1.2 mg/dL   GFR, Estimated >39 >39 mL/min    Comment: (NOTE) Calculated using the CKD-EPI Creatinine Equation (2021)    Anion gap 13 5 - 15    Comment: Performed at [facility], [address] [place], [place] [address]  hCG, serum, qualitative     Status: None   Collection Time: 06/16/24  7:48 AM  Result Value Ref Range   Preg, Serum NEGATIVE NEGATIVE    Comment:        THE SENSITIVITY OF THIS METHODOLOGY  IS >10 mIU/mL. Performed at [facility], [address] [place], [place] [address]   CBC     Status: None   Collection Time: 06/16/24  8:05 AM  Result Value Ref Range   WBC 7.1 4.0 - 10.5 K/uL   RBC 4.50 3.87 - 5.11 MIL/uL   Hemoglobin 13.7 12.0 - 15.0 g/dL   HCT 61.1 63.9 - 53.9 %   MCV 86.2 80.0 - 100.0 fL   MCH 30.4 26.0 - 34.0 pg   MCHC 35.3 30.0 - 36.0 g/dL   RDW 88.1 88.4 - 84.4 %   Platelets 264 150 - 400 K/uL   nRBC 0.0 0.0 - 0.2 %    Comment: Performed at [facility], [address], [place], [place] [address]    No results found.  ROS 10 point review of systems is negative except as listed above in HPI.   Physical Exam Blood pressure 102/69, pulse 71, temperature (!) 97.3 F (36.3 C), temperature source Oral, resp. rate 18, height 5' 1 (1.549 m), weight 52.2 kg, SpO2  98%, unknown if currently breastfeeding. Constitutional: well-developed, well-nourished HEENT: pupils equal, round, reactive to light, 2mm b/l, moist conjunctiva, external inspection of ears and nose normal, hearing intact Oropharynx: normal oropharyngeal mucosa, normal dentition Neck: no thyromegaly, trachea midline, no midline cervical tenderness to palpation Chest: breath sounds equal bilaterally, normal respiratory effort, no midline or lateral chest wall tenderness to palpation/deformity Abdomen: soft, NT, RUQ cholecystostomy tube, no bruising, no hepatosplenomegaly Skin: warm, dry, no rashes Psych: normal memory, normal mood/affect     Assessment/Plan: H/o open cholecystostomy tube placement - plan lap chole today. Informed consent was obtained after detailed explanation of risks, including bleeding, infection, biloma, hematoma, injury to common bile duct, need for IOC to delineate anatomy, and need for conversion to open procedure. All questions answered to the patient's satisfaction. Consent obtained in SPanish using interpreter services, Cornelius, PI#[id number] FEN - strict NPO DVT - SCDs, LMWH Dispo - home post-op    Dreama GEANNIE Hanger, MD General and Trauma Surgery [facility]

## 2024-06-16 NOTE — Op Note (Signed)
   Operative Note  Date: 06/16/2024  Procedure: laparoscopic cholecystectomy  Pre-op diagnosis: h/o open cholecystostomy tube placement Post-op diagnosis: same  Indication and clinical history: The patient is a 23 y.o. year old female with h/o open cholecystostomy tube placement  Surgeon: Dreama GEANNIE Hanger, MD  Anesthesiologist: Mallory, MD Anesthesia: General  Findings:  Specimen: gallbladder EBL: <5cc Drains/Implants: none  Disposition: PACU - hemodynamically stable.  Description of procedure: The patient was positioned supine on the operating room table. Time-out was performed verifying correct patient, procedure, signature of informed consent, and administration of pre-operative antibiotics. General anesthetic induction and intubation were uneventful. The abdomen was prepped and draped in the usual sterile fashion. An infra-umbilical incision was made using an open technique using zero vicryl stay sutures on either side of the fascia and a 10mm Hassan port inserted. After establishing pneumoperitoneum, which the patient tolerated well, the abdominal cavity was inspected and no injury of any intra-abdominal structures was identified. Additional ports were placed under direct visualization and using local anesthetic: one 5mm ports in the right subcostal region and a 5mm port in the epigastric region. The patient was re-positioned to reverse Trendelenburg and right side up. Adhesiolysis was performed to expose the gallbladder, which was then retracted cephalad. The infundibulum was identified and retracted toward the right lower quadrant. The peritoneum was incised over the infundibulum and the triangle of Calot dissected. The cystic artery was identified overlying the gallbladder and was isolated, doubly clipped, and divided. Dissection continued to identify and isolate the cystic duct which was also doubly clipped and divided. The gallbladder was dissected off the liver bed using  electrocautery and hemostasis of the liver bed was confirmed prior to separation of the final peritoneal attachments of the gallbladder to the liver bed. The gallbladder fossa was irrigated and fluid returned clear. After transection of the final peritoneal attachments, the gallbladder was placed in an endoscopic specimen retrieval bag, removed via the umbilical port site, and sent to pathology as a permanent specimen. The gallbladder fossa was inspected confirming hemostasis, the absence of bile leakage from the cystic duct stump, and correct placement of clips on the cystic artery and cystic duct stumps. The abdomen was desufflated and the fascia of the umbilical port site was closed using the previously placed stay sutures. Additional local anesthetic was administered at the umbilical port site.  The skin of all incisions was closed with 4-0 monocryl. Sterile dressings were applied. All sponge and instrument counts were correct at the conclusion of the procedure. The patient was awakened from anesthesia, extubated uneventfully, and transported to the PACU - hemodynamically stable.. There were no complications.    Dreama GEANNIE Hanger, MD General and Trauma Surgery [facility]

## 2024-06-16 NOTE — Anesthesia Preprocedure Evaluation (Addendum)
 Anesthesia Evaluation  Patient identified by MRN, date of birth, ID band Patient awake    Reviewed: Allergy & Precautions, H&P , NPO status , Patient's Chart, lab work & pertinent test results  Airway Mallampati: I  TM Distance: >3 FB Neck ROM: Full    Dental no notable dental hx. (+) Teeth Intact, Dental Advisory Given   Pulmonary neg pulmonary ROS   Pulmonary exam normal breath sounds clear to auscultation       Cardiovascular Exercise Tolerance: Good hypertension, Pt. on medications negative cardio ROS Normal cardiovascular exam Rhythm:Regular Rate:Normal     Neuro/Psych negative neurological ROS  negative psych ROS   GI/Hepatic negative GI ROS, Neg liver ROS,,,  Endo/Other  negative endocrine ROS    Renal/GU negative Renal ROS  negative genitourinary   Musculoskeletal negative musculoskeletal ROS (+)    Abdominal   Peds negative pediatric ROS (+)  Hematology negative hematology ROS (+)   Anesthesia Other Findings   Reproductive/Obstetrics negative OB ROS                              Anesthesia Physical Anesthesia Plan  ASA: 1 and emergent  Anesthesia Plan: General   Post-op Pain Management: Minimal or no pain anticipated, Toradol IV (intra-op)* and Ofirmev  IV (intra-op)*   Induction: Intravenous  PONV Risk Score and Plan: 3 and Dexamethasone  and Treatment may vary due to age or medical condition  Airway Management Planned: Oral ETT  Additional Equipment: None  Intra-op Plan:   Post-operative Plan: Extubation in OR  Informed Consent: I have reviewed the patients History and Physical, chart, labs and discussed the procedure including the risks, benefits and alternatives for the proposed anesthesia with the patient or authorized representative who has indicated his/her understanding and acceptance.       Plan Discussed with: Anesthesiologist and CRNA  Anesthesia  Plan Comments: (  )         Anesthesia Quick Evaluation

## 2024-06-16 NOTE — ED Provider Notes (Signed)
 [place] [facility] Provider Note   CSN: [id number] Arrival date & time: 06/16/24  [id number]     Patient presents with: Abdominal Pain   Toni Klein is a 23 y.o. female.   23 year old female who presents with persistent pain to right upper quadrant.  Patient is status post gallbladder tube placement 2 months ago.  Was seen by general surgery in the office 2 days ago and is scheduled for surgery today.  Denies any fever or chills.  No nausea or vomiting.  Pain is unchanged.       Prior to Admission medications   Medication Sig Start Date End Date Taking? Authorizing Provider  acetaminophen  (TYLENOL ) 325 MG tablet Take 2 tablets (650 mg total) by mouth every 6 (six) hours. Patient not taking: Reported on 05/07/2024 04/01/24   Fredirick Glenys RAMAN, MD  amoxicillin -clavulanate (AUGMENTIN ) 875-125 MG tablet Take 1 tablet by mouth 2 (two) times daily for 10 days Patient not taking: Reported on 05/07/2024 04/05/24   Jayne Vonn DEL, MD  cyclobenzaprine  (FLEXERIL ) 5 MG tablet Take 1 tablet (5 mg total) by mouth 3 (three) times daily. Patient not taking: Reported on 05/07/2024 04/01/24   Fredirick Glenys RAMAN, MD  ibuprofen  (ADVIL ) 800 MG tablet Take 1 tablet (800 mg total) by mouth every 8 (eight) hours as needed. 04/05/24   Jayne Vonn DEL, MD  NIFEdipine  (ADALAT  CC) 30 MG 24 hr tablet Take 1 tablet (30 mg total) by mouth daily. Patient not taking: Reported on 05/07/2024 04/02/24   Fredirick Glenys RAMAN, MD  nitrofurantoin , macrocrystal-monohydrate, (MACROBID ) 100 MG capsule Take 1 capsule (100 mg total) by mouth 2 (two) times daily. 05/07/24   Cresenzo, Norleen GAILS, MD  oxyCODONE  (OXY IR/ROXICODONE ) 5 MG immediate release tablet Take 1 tablet (5 mg total) by mouth every 4 (four) hours as needed for moderate pain (pain score 4-6) or severe pain (pain score 7-10). Patient not taking: Reported on 05/07/2024 04/05/24   Jayne Vonn DEL, MD  Prenatal Vit-Fe Fumarate-FA  (MULTIVITAMIN-PRENATAL) 27-0.8 MG TABS tablet Take 1 tablet by mouth daily at 12 noon.    [provider]    Allergies: Patient has no known allergies.    Review of Systems  All other systems reviewed and are negative.   Updated Vital Signs BP 114/75 (BP Location: Right Arm)   Pulse 93   Temp 99.4 F (37.4 C) (Oral)   Resp 17   SpO2 100%   Physical Exam Vitals and nursing note reviewed.  Constitutional:      General: She is not in acute distress.    Appearance: Normal appearance. She is well-developed. She is not toxic-appearing.  HENT:     Head: Normocephalic and atraumatic.  Eyes:     General: Lids are normal.     Conjunctiva/sclera: Conjunctivae normal.     Pupils: Pupils are equal, round, and reactive to light.  Neck:     Thyroid: No thyroid mass.     Trachea: No tracheal deviation.  Cardiovascular:     Rate and Rhythm: Normal rate and regular rhythm.     Heart sounds: Normal heart sounds. No murmur heard.    No gallop.  Pulmonary:     Effort: Pulmonary effort is normal. No respiratory distress.     Breath sounds: Normal breath sounds. No stridor. No decreased breath sounds, wheezing, rhonchi or rales.  Abdominal:     General: There is no distension.     Palpations: Abdomen is soft.  Tenderness: There is no abdominal tenderness. There is no rebound.   Musculoskeletal:        General: No tenderness. Normal range of motion.     Cervical back: Normal range of motion and neck supple.  Skin:    General: Skin is warm and dry.     Findings: No abrasion or rash.  Neurological:     Mental Status: She is alert and oriented to person, place, and time. Mental status is at baseline.     GCS: GCS eye subscore is 4. GCS verbal subscore is 5. GCS motor subscore is 6.     Cranial Nerves: No cranial nerve deficit.     Sensory: No sensory deficit.     Motor: Motor function is intact.  Psychiatric:        Attention and Perception: Attention normal.        Speech:  Speech normal.        Behavior: Behavior normal.     (all labs ordered are listed, but only abnormal results are displayed) Labs Reviewed  CULTURE, BLOOD (ROUTINE X 2)  CULTURE, BLOOD (ROUTINE X 2)  LIPASE, BLOOD  COMPREHENSIVE METABOLIC PANEL WITH GFR  HCG, SERUM, QUALITATIVE    EKG: None  Radiology: No results found.   Procedures   Medications Ordered in the ED  0.9 %  sodium chloride  infusion (has no administration in time range)                                    Medical Decision Making Amount and/or Complexity of Data Reviewed Labs: ordered.  Risk Prescription drug management.   Discussed with general who will come and see patient     Final diagnoses:  None    ED Discharge Orders     None          Dasie Faden, MD 06/16/24 [phone]

## 2024-06-16 NOTE — ED Notes (Signed)
 Dressing over drain on abdomen was saturated, removed and replaced with clean dry dressing

## 2024-06-17 ENCOUNTER — Encounter (HOSPITAL_COMMUNITY): Payer: Self-pay | Admitting: Surgery

## 2024-06-18 ENCOUNTER — Emergency Department (HOSPITAL_COMMUNITY): Payer: Self-pay

## 2024-06-18 ENCOUNTER — Emergency Department (HOSPITAL_COMMUNITY)
Admission: EM | Admit: 2024-06-18 | Discharge: 2024-06-18 | Discharge status: Against Medical Advice | Payer: Self-pay | Attending: Emergency Medicine | Admitting: Emergency Medicine

## 2024-06-18 DIAGNOSIS — Z5321 Procedure and treatment not carried out due to patient leaving prior to being seen by health care provider: Secondary | ICD-10-CM | POA: Insufficient documentation

## 2024-06-18 DIAGNOSIS — R110 Nausea: Secondary | ICD-10-CM | POA: Insufficient documentation

## 2024-06-18 DIAGNOSIS — R0789 Other chest pain: Secondary | ICD-10-CM | POA: Insufficient documentation

## 2024-06-18 LAB — CBC
HCT: 39 % (ref 36.0–46.0)
Hemoglobin: 13.3 g/dL (ref 12.0–15.0)
MCH: 29.8 pg (ref 26.0–34.0)
MCHC: 34.1 g/dL (ref 30.0–36.0)
MCV: 87.4 fL (ref 80.0–100.0)
Platelets: 336 K/uL (ref 150–400)
RBC: 4.46 MIL/uL (ref 3.87–5.11)
RDW: 11.8 % (ref 11.5–15.5)
WBC: 10.8 K/uL — ABNORMAL HIGH (ref 4.0–10.5)
nRBC: 0 % (ref 0.0–0.2)

## 2024-06-18 LAB — BASIC METABOLIC PANEL WITH GFR
Anion gap: 12 (ref 5–15)
BUN: 9 mg/dL (ref 6–20)
CO2: 21 mmol/L — ABNORMAL LOW (ref 22–32)
Calcium: 9.2 mg/dL (ref 8.9–10.3)
Chloride: 103 mmol/L (ref 98–111)
Creatinine, Ser: 0.6 mg/dL (ref 0.44–1.00)
GFR, Estimated: >60 mL/min (ref >60)
Glucose, Bld: 102 mg/dL — ABNORMAL HIGH (ref 70–99)
Potassium: 3.5 mmol/L (ref 3.5–5.1)
Sodium: 136 mmol/L (ref 135–145)

## 2024-06-18 LAB — HEPATIC FUNCTION PANEL
ALT: 122 U/L — ABNORMAL HIGH (ref 0–44)
AST: 211 U/L — ABNORMAL HIGH (ref 15–41)
Albumin: 4.1 g/dL (ref 3.5–5.0)
Alkaline Phosphatase: 61 U/L (ref 38–126)
Bilirubin, Direct: 0.2 mg/dL (ref 0.0–0.2)
Indirect Bilirubin: 0.3 mg/dL (ref 0.3–0.9)
Total Bilirubin: 0.5 mg/dL (ref 0.0–1.2)
Total Protein: 7.6 g/dL (ref 6.5–8.1)

## 2024-06-18 LAB — TROPONIN I (HIGH SENSITIVITY): Troponin I (High Sensitivity): 2 ng/L (ref <18)

## 2024-06-18 LAB — HCG, SERUM, QUALITATIVE: Preg, Serum: NEGATIVE

## 2024-06-18 LAB — SURGICAL PATHOLOGY

## 2024-06-18 MED ORDER — HYDROCODONE-ACETAMINOPHEN 5-325 MG PO TABS
1.0000 | ORAL_TABLET | Freq: Once | ORAL | Status: AC
  Administered 2024-06-18: 1 via ORAL
  Filled 2024-06-18: qty 1

## 2024-06-18 MED ORDER — ONDANSETRON 4 MG PO TBDP
4.0000 mg | ORAL_TABLET | Freq: Once | ORAL | Status: AC
  Administered 2024-06-18: 4 mg via ORAL
  Filled 2024-06-18: qty 1

## 2024-06-18 NOTE — ED Notes (Signed)
Pt name called for updated vitals, no response 

## 2024-06-18 NOTE — ED Triage Notes (Signed)
 Pt arrives POV c/o left sided chest pain that started last night. Pt had a cholecystectomy done on 7/28 but denies problems from that procedure. Complains of nausea tonight.

## 2024-06-21 ENCOUNTER — Emergency Department (HOSPITAL_COMMUNITY): Payer: MEDICAID

## 2024-06-21 ENCOUNTER — Encounter (HOSPITAL_COMMUNITY): Payer: Self-pay

## 2024-06-21 ENCOUNTER — Inpatient Hospital Stay (HOSPITAL_COMMUNITY): Payer: MEDICAID

## 2024-06-21 ENCOUNTER — Inpatient Hospital Stay (HOSPITAL_COMMUNITY)
Admission: EM | Admit: 2024-06-21 | Discharge: 2024-06-25 | DRG: 446 | Disposition: A | Discharge status: Home / Self Care | Payer: MEDICAID | Attending: Surgery | Admitting: Surgery

## 2024-06-21 ENCOUNTER — Other Ambulatory Visit: Payer: Self-pay

## 2024-06-21 DIAGNOSIS — R1110 Vomiting, unspecified: Secondary | ICD-10-CM | POA: Diagnosis present

## 2024-06-21 DIAGNOSIS — Z79899 Other long term (current) drug therapy: Secondary | ICD-10-CM

## 2024-06-21 DIAGNOSIS — Z603 Acculturation difficulty: Secondary | ICD-10-CM | POA: Diagnosis present

## 2024-06-21 DIAGNOSIS — K8050 Calculus of bile duct without cholangitis or cholecystitis without obstruction: Secondary | ICD-10-CM | POA: Diagnosis present

## 2024-06-21 DIAGNOSIS — R7401 Elevation of levels of liver transaminase levels: Secondary | ICD-10-CM | POA: Diagnosis present

## 2024-06-21 DIAGNOSIS — Z9049 Acquired absence of other specified parts of digestive tract: Secondary | ICD-10-CM | POA: Diagnosis not present

## 2024-06-21 DIAGNOSIS — G8918 Other acute postprocedural pain: Principal | ICD-10-CM

## 2024-06-21 LAB — CBC
HCT: 43.5 % (ref 36.0–46.0)
Hemoglobin: 14.9 g/dL (ref 12.0–15.0)
MCH: 29.7 pg (ref 26.0–34.0)
MCHC: 34.3 g/dL (ref 30.0–36.0)
MCV: 86.7 fL (ref 80.0–100.0)
Platelets: 350 K/uL (ref 150–400)
RBC: 5.02 MIL/uL (ref 3.87–5.11)
RDW: 11.9 % (ref 11.5–15.5)
WBC: 9.5 K/uL (ref 4.0–10.5)
nRBC: 0 % (ref 0.0–0.2)

## 2024-06-21 LAB — BASIC METABOLIC PANEL WITH GFR
Anion gap: 10 (ref 5–15)
BUN: <5 mg/dL — ABNORMAL LOW (ref 6–20)
CO2: 22 mmol/L (ref 22–32)
Calcium: 9.8 mg/dL (ref 8.9–10.3)
Chloride: 105 mmol/L (ref 98–111)
Creatinine, Ser: 0.41 mg/dL — ABNORMAL LOW (ref 0.44–1.00)
GFR, Estimated: >60 mL/min (ref >60)
Glucose, Bld: 110 mg/dL — ABNORMAL HIGH (ref 70–99)
Potassium: 3.9 mmol/L (ref 3.5–5.1)
Sodium: 137 mmol/L (ref 135–145)

## 2024-06-21 LAB — TROPONIN I (HIGH SENSITIVITY)
Troponin I (High Sensitivity): 3 ng/L (ref <18)
Troponin I (High Sensitivity): 6 ng/L (ref <18)

## 2024-06-21 LAB — HEPATIC FUNCTION PANEL
ALT: 702 U/L — ABNORMAL HIGH (ref 0–44)
AST: 209 U/L — ABNORMAL HIGH (ref 15–41)
Albumin: 4.1 g/dL (ref 3.5–5.0)
Alkaline Phosphatase: 255 U/L — ABNORMAL HIGH (ref 38–126)
Bilirubin, Direct: 0.6 mg/dL — ABNORMAL HIGH (ref 0.0–0.2)
Indirect Bilirubin: 1.3 mg/dL — ABNORMAL HIGH (ref 0.3–0.9)
Total Bilirubin: 1.9 mg/dL — ABNORMAL HIGH (ref 0.0–1.2)
Total Protein: 7.8 g/dL (ref 6.5–8.1)

## 2024-06-21 LAB — ACETAMINOPHEN LEVEL: Acetaminophen (Tylenol), Serum: <10 ug/mL — ABNORMAL LOW (ref 10–30)

## 2024-06-21 LAB — HCG, SERUM, QUALITATIVE: Preg, Serum: NEGATIVE

## 2024-06-21 LAB — LIPASE, BLOOD: Lipase: 19 U/L (ref 11–51)

## 2024-06-21 MED ORDER — IBUPROFEN 200 MG PO TABS
600.0000 mg | ORAL_TABLET | Freq: Four times a day (QID) | ORAL | Status: DC | PRN
Start: 2024-06-21 — End: 2024-06-25

## 2024-06-21 MED ORDER — POLYETHYLENE GLYCOL 3350 17 G PO PACK
17.0000 g | PACK | Freq: Every day | ORAL | Status: DC
  Administered 2024-06-22 – 2024-06-25 (×3): 17 g via ORAL
  Filled 2024-06-21 (×4): qty 1

## 2024-06-21 MED ORDER — ENOXAPARIN SODIUM 40 MG/0.4ML IJ SOSY
40.0000 mg | PREFILLED_SYRINGE | INTRAMUSCULAR | Status: DC
  Administered 2024-06-21: 40 mg via SUBCUTANEOUS
  Filled 2024-06-21: qty 0.4

## 2024-06-21 MED ORDER — MORPHINE SULFATE (PF) 4 MG/ML IV SOLN
4.0000 mg | Freq: Once | INTRAVENOUS | Status: AC
  Administered 2024-06-21: 4 mg via INTRAVENOUS
  Filled 2024-06-21: qty 1

## 2024-06-21 MED ORDER — HYDRALAZINE HCL 20 MG/ML IJ SOLN: 10.0000 mg | INTRAMUSCULAR | Status: DC | PRN

## 2024-06-21 MED ORDER — DIPHENHYDRAMINE HCL 50 MG/ML IJ SOLN: 12.5000 mg | Freq: Four times a day (QID) | INTRAMUSCULAR | Status: DC | PRN

## 2024-06-21 MED ORDER — SIMETHICONE 80 MG PO CHEW
40.0000 mg | CHEWABLE_TABLET | Freq: Four times a day (QID) | ORAL | Status: DC | PRN
  Administered 2024-06-24: 40 mg via ORAL
  Filled 2024-06-21: qty 1

## 2024-06-21 MED ORDER — IOHEXOL 350 MG/ML SOLN
75.0000 mL | Freq: Once | INTRAVENOUS | Status: AC | PRN
  Administered 2024-06-21: 75 mL via INTRAVENOUS

## 2024-06-21 MED ORDER — OXYCODONE HCL 5 MG PO TABS
5.0000 mg | ORAL_TABLET | Freq: Four times a day (QID) | ORAL | Status: DC | PRN
  Administered 2024-06-21: 5 mg via ORAL
  Filled 2024-06-21: qty 1

## 2024-06-21 MED ORDER — ONDANSETRON 4 MG PO TBDP
4.0000 mg | ORAL_TABLET | Freq: Four times a day (QID) | ORAL | Status: DC | PRN
  Administered 2024-06-24: 4 mg via ORAL
  Filled 2024-06-21: qty 1

## 2024-06-21 MED ORDER — LACTATED RINGERS IV SOLN: INTRAVENOUS | Status: AC

## 2024-06-21 MED ORDER — DIPHENHYDRAMINE HCL 12.5 MG/5ML PO ELIX: 12.5000 mg | ORAL_SOLUTION | Freq: Four times a day (QID) | ORAL | Status: DC | PRN

## 2024-06-21 MED ORDER — ONDANSETRON HCL 4 MG/2ML IJ SOLN
4.0000 mg | Freq: Once | INTRAMUSCULAR | Status: AC
  Administered 2024-06-21: 4 mg via INTRAVENOUS
  Filled 2024-06-21: qty 2

## 2024-06-21 MED ORDER — ONDANSETRON HCL 4 MG/2ML IJ SOLN: 4.0000 mg | Freq: Four times a day (QID) | INTRAMUSCULAR | Status: DC | PRN

## 2024-06-21 MED ORDER — MORPHINE SULFATE (PF) 4 MG/ML IV SOLN
4.0000 mg | INTRAVENOUS | Status: DC | PRN
  Administered 2024-06-21 – 2024-06-22 (×3): 4 mg via INTRAVENOUS
  Filled 2024-06-21 (×3): qty 1

## 2024-06-21 MED ORDER — SODIUM CHLORIDE 0.9 % IV BOLUS
1000.0000 mL | Freq: Once | INTRAVENOUS | Status: AC
  Administered 2024-06-21: 1000 mL via INTRAVENOUS

## 2024-06-21 MED ORDER — MELATONIN 3 MG PO TABS: 3.0000 mg | ORAL_TABLET | Freq: Every evening | ORAL | Status: DC | PRN

## 2024-06-21 NOTE — ED Provider Notes (Signed)
 [facility] AT [facility] Provider Note   CSN: [id number] Arrival date & time: 06/21/24  1403     Patient presents with: Chest Pain   Toni Klein is a 23 y.o. female.  Status postcholecystectomy 06/16/2024 presenting to emergency room with complaint of chest pain and shortness of breath for approximately 5 days associated with epigastric pain nausea and vomiting.  Patient is actively vomiting nonbilious nonbloody vomit in the room.  She notes chills at home patient reports that when her chest pain started she had come to the emergency room with left-sided chest pain but could not stay to be evaluated.  She has bilateral lower extremity cramping but no swelling or specific calf tenderness.  No history of DVT or PE.    Chest Pain      Prior to Admission medications   Medication Sig Start Date End Date Taking? Authorizing Provider  acetaminophen  (TYLENOL ) 500 MG tablet Take 2 tablets (1,000 mg total) by mouth every 6 (six) hours. 06/16/24   Paola Dreama SAILOR, MD  ibuprofen  (ADVIL ) 600 MG tablet Take 1 tablet (600 mg total) by mouth every 8 (eight) hours as needed. 06/16/24   Paola Dreama SAILOR, MD  methocarbamol  (ROBAXIN ) 750 MG tablet Take 1 tablet (750 mg total) by mouth 4 (four) times daily. 06/16/24   Paola Dreama SAILOR, MD  NIFEdipine  (ADALAT  CC) 30 MG 24 hr tablet Take 1 tablet (30 mg total) by mouth daily. Patient not taking: Reported on 05/07/2024 04/02/24   Fredirick Glenys RAMAN, MD  nitrofurantoin , macrocrystal-monohydrate, (MACROBID ) 100 MG capsule Take 1 capsule (100 mg total) by mouth 2 (two) times daily. 05/07/24   Cresenzo, Norleen GAILS, MD  oxyCODONE  (OXY IR/ROXICODONE ) 5 MG immediate release tablet Take 1 tablet (5 mg total) by mouth every 4 (four) hours as needed for severe pain (pain score 7-10). 06/16/24   Paola Dreama SAILOR, MD  polyethylene glycol (MIRALAX ) 17 g packet Take 17 g by mouth daily. 06/16/24   Paola Dreama SAILOR, MD  Prenatal Vit-Fe  Fumarate-FA (MULTIVITAMIN-PRENATAL) 27-0.8 MG TABS tablet Take 1 tablet by mouth daily at 12 noon.    [provider]    Allergies: Patient has no known allergies.    Review of Systems  Cardiovascular:  Positive for chest pain.    Updated Vital Signs BP (!) 124/96 (BP Location: Right Arm)   Pulse 72   Temp 98.3 F (36.8 C) (Oral)   Resp 16   Ht 5' 1 (1.549 m)   Wt 50.3 kg   LMP 05/27/2024   SpO2 98%   Breastfeeding Yes   BMI 20.95 kg/m   Physical Exam Vitals and nursing note reviewed.  Constitutional:      General: She is not in acute distress.    Appearance: She is not toxic-appearing.  HENT:     Head: Normocephalic and atraumatic.  Eyes:     General: No scleral icterus.    Conjunctiva/sclera: Conjunctivae normal.  Cardiovascular:     Rate and Rhythm: Regular rhythm. Tachycardia present.     Pulses: Normal pulses.     Heart sounds: Normal heart sounds.  Pulmonary:     Effort: Pulmonary effort is normal. No respiratory distress.     Breath sounds: Normal breath sounds.  Chest:     Chest wall: Tenderness present.  Abdominal:     General: Abdomen is flat. Bowel sounds are normal.     Palpations: Abdomen is soft.     Tenderness: There  is abdominal tenderness.     Comments: Incision site from recent cholecystectomy clean dry and intact. Patient with significant tenderness to palpation to right upper quadrant and epigastric area.  Musculoskeletal:     Right lower leg: No edema.     Left lower leg: No edema.  Skin:    General: Skin is warm and dry.     Findings: No lesion.  Neurological:     General: No focal deficit present.     Mental Status: She is alert and oriented to person, place, and time. Mental status is at baseline.     (all labs ordered are listed, but only abnormal results are displayed) Labs Reviewed  CBC  HCG, SERUM, QUALITATIVE  LIPASE, BLOOD  BASIC METABOLIC PANEL WITH GFR  HEPATIC FUNCTION PANEL  TROPONIN I (HIGH SENSITIVITY)     EKG: None  Radiology: No results found.   Procedures   Medications Ordered in the ED  ondansetron  (ZOFRAN ) injection 4 mg (has no administration in time range)  sodium chloride  0.9 % bolus 1,000 mL (has no administration in time range)  morphine  (PF) 4 MG/ML injection 4 mg (has no administration in time range)                                    Medical Decision Making Amount and/or Complexity of Data Reviewed Labs: ordered. Radiology: ordered.  Risk Prescription drug management.   This patient presents to the ED for concern of chest pain, this involves an extensive number of treatment options, and is a complaint that carries with it a high risk of complications and morbidity.  The differential diagnosis includes ACS, pulmonary embolism, pericarditis, myocarditis, CHF, pneumonia, pneumothorax, asthma, pancreatitis, cholecystitis, choledocholithiasis, bowel obstruction, bowel perforation, hematoma   Co morbidities that complicate the patient evaluation  Status postcholecystectomy 728 of this month   Additional history obtained:  Additional history obtained from reviewed recent ED visit on 06/08/2024 as well as operative note.   Lab Tests:  I personally interpreted labs.  The pertinent results include:   CBC, CMP, lipase, troponin   Imaging Studies ordered:  I ordered imaging studies including CT PE study of chest, CT abdomen pelvis Pending at the time of sign off  Cardiac Monitoring: / EKG:  The patient was maintained on a cardiac monitor.  I personally viewed and interpreted the cardiac monitored which showed an underlying rhythm of: Normal sinus rhythm   Problem List / ED Course / Critical interventions / Medication management  Patient presents to emergency room with complaint of chest pain which she locates to center of chest and left lower side associated with shortness of breath.  She recently had surgery 5 days ago to have her gallbladder removed.   Patient reports that each day she has had gradual progression of her chest pain and abdominal pain.  On my exam her incision site is clean dry and intact.  She does have significant tenderness to right upper quadrant and epigastric area.  She is also tearful and vomiting in room.  Reports vomiting started today.  She is still having bowel movements.  No fever reported or chills.  Vital signs are stable.  Will rule out pulmonary embolism or other chest abnormalities as well as obtain CT abdomen pelvis to assess for any potential postop complications. I ordered medication including Zofran , morphine , normal saline for pain control and nausea Reevaluation of the patient after these  medicines showed that the patient improved I have reviewed the patients home medicines and have made adjustments as needed Signed out to oncoming ED PA at shift change.       Final diagnoses:  None    ED Discharge Orders     None          Shermon Warren LOISE DEVONNA 06/21/24 1507    Melvenia Motto, MD 06/21/24 [phone]

## 2024-06-21 NOTE — H&P (Signed)
 CC: Elevated LFTs, recent cholecystectomy  HPI: Toni Klein is an 23 y.o. female with hx of HELLP during pregnancy and enlarged dilated gallbladder at time of cesarean section.  She was seen in consultation by one of my partners, Dr. Paola, and a Foley catheter was placed to drain the gallbladder.  She was subsequently taken for cholecystectomy 06/08/2024 by Dr. Paola.  Cystic duct was controlled with clips.  She was discharged home following.  She returns emergency department today with reported chest pain/shortness of breath for 5 days as well as some epigastric pain, nausea, vomiting.  She was noted to be vomiting while in the emergency room.  She reports chills at home.  She underwent workup in emergency department is found to have a transaminitis with a mildly elevated bilirubin, indirect fraction.  CTA chest ruled out pulmonary embolism.  CT abdomen/pelvis demonstrated minimal fluid over the gallbladder fossa.  Mild decompression wall thickening of the transverse colon with hazy adjacent mesentery which could be seen due to mild acute colitis.  We are asked to see.  Past Medical History:  Diagnosis Date   Blood transfusion without reported diagnosis 2025   DIC (disseminated intravascular coagulation) (HCC)    Ectopic pregnancy    Methotrexate    HELLP (hemolytic anemia/elev liver enzymes/low platelets in pregnancy) 2025   Hemolysis, elevated liver enzymes, and low platelet (HELLP) syndrome during pregnancy, antepartum 03/27/2024    Past Surgical History:  Procedure Laterality Date   CESAREAN SECTION N/A 03/27/2024   Procedure: CESAREAN DELIVERY;  Surgeon: Cris Burnard DEL, MD;  Location: [facility] OR;  Service: Obstetrics;  Laterality: N/A;   CHOLECYSTECTOMY N/A 06/16/2024   Procedure: LAPAROSCOPIC CHOLECYSTECTOMY;  Surgeon: Paola Dreama SAILOR, MD;  Location: [facility] OR;  Service: General;  Laterality: N/A;   EXAM UNDER ANESTHESIA, PELVIC N/A 03/27/2024   Procedure: EXAM UNDER  ANESTHESIA, ABDOMINAL WITH CHOLECYSTOTOMY TUBE INSERTION;  Surgeon: Paola Dreama SAILOR, MD;  Location: [facility] OR;  Service: General;  Laterality: N/A;   IR CHOLANGIOGRAM EXISTING TUBE  05/19/2024   NO PAST SURGERIES     TOOTH EXTRACTION      Family History  Problem Relation Age of Onset   Healthy Mother    Healthy Father     Social:  reports that she has never smoked. She has never used smokeless tobacco. She reports that she does not drink alcohol and does not use drugs.  Allergies: No Known Allergies  Medications: I have reviewed the patient's current medications.  Results for orders placed or performed during the hospital encounter of 06/21/24 (from the past 48 hours)  CBC     Status: None   Collection Time: 06/21/24  2:12 PM  Result Value Ref Range   WBC 9.5 4.0 - 10.5 K/uL   RBC 5.02 3.87 - 5.11 MIL/uL   Hemoglobin 14.9 12.0 - 15.0 g/dL   HCT 56.4 63.9 - 53.9 %   MCV 86.7 80.0 - 100.0 fL   MCH 29.7 26.0 - 34.0 pg   MCHC 34.3 30.0 - 36.0 g/dL   RDW 88.0 88.4 - 84.4 %   Platelets 350 150 - 400 K/uL   nRBC 0.0 0.0 - 0.2 %    Comment: Performed at [facility], [address] [place], [place] [address]  hCG, serum, qualitative     Status: None   Collection Time: 06/21/24  2:12 PM  Result Value Ref Range   Preg, Serum NEGATIVE NEGATIVE    Comment:  THE SENSITIVITY OF THIS METHODOLOGY IS >10 mIU/mL. Performed at [facility], [address] [place], [place] [address]   Lipase, blood     Status: None   Collection Time: 06/21/24  2:35 PM  Result Value Ref Range   Lipase 19 11 - 51 U/L    Comment: Performed at [facility], [address] [place], [place] [address]  Basic metabolic panel with GFR     Status: Abnormal   Collection Time: 06/21/24  2:57 PM  Result Value Ref Range   Sodium 137 135 - 145 mmol/L   Potassium 3.9 3.5 - 5.1 mmol/L    Comment: HEMOLYSIS AT THIS LEVEL MAY AFFECT RESULT   Chloride 105 98 - 111 mmol/L   CO2 22 22 - 32 mmol/L    Glucose, Bld 110 (H) 70 - 99 mg/dL    Comment: Glucose reference range applies only to samples taken after fasting for at least 8 hours.   BUN <5 (L) 6 - 20 mg/dL   Creatinine, Ser 9.58 (L) 0.44 - 1.00 mg/dL   Calcium  9.8 8.9 - 10.3 mg/dL   GFR, Estimated >39 >39 mL/min    Comment: (NOTE) Calculated using the CKD-EPI Creatinine Equation (2021)    Anion gap 10 5 - 15    Comment: Performed at [facility], [address], [place], [place] [address]  Troponin I (High Sensitivity)     Status: None   Collection Time: 06/21/24  2:57 PM  Result Value Ref Range   Troponin I (High Sensitivity) 3 <18 ng/L    Comment: (NOTE) Elevated high sensitivity troponin I (hsTnI) values and significant  changes across serial measurements may suggest ACS but many other  chronic and acute conditions are known to elevate hsTnI results.  Refer to the Links section for chest pain algorithms and additional  guidance. Performed at [facility], [address], [place], [place] [address]   Hepatic function panel     Status: Abnormal   Collection Time: 06/21/24  2:57 PM  Result Value Ref Range   Total Protein 7.8 6.5 - 8.1 g/dL   Albumin  4.1 3.5 - 5.0 g/dL   AST 790 (H) 15 - 41 U/L    Comment: HEMOLYSIS AT THIS LEVEL MAY AFFECT RESULT   ALT 702 (H) 0 - 44 U/L    Comment: HEMOLYSIS AT THIS LEVEL MAY AFFECT RESULT   Alkaline Phosphatase 255 (H) 38 - 126 U/L   Total Bilirubin 1.9 (H) 0.0 - 1.2 mg/dL    Comment: HEMOLYSIS AT THIS LEVEL MAY AFFECT RESULT   Bilirubin, Direct 0.6 (H) 0.0 - 0.2 mg/dL   Indirect Bilirubin 1.3 (H) 0.3 - 0.9 mg/dL    Comment: Performed at [facility], [address], [place], [place] [address]    CT Angio Chest PE W and/or Wo Contrast Result Date: 06/21/2024 CLINICAL DATA:  Epigastric pain and chest pain. Suspect pulmonary embolism. Cholecystectomy 06/16/2024 EXAM: CT ANGIOGRAPHY CHEST CT ABDOMEN AND PELVIS WITH CONTRAST TECHNIQUE: Multidetector CT imaging of  the chest was performed using the standard protocol during bolus administration of intravenous contrast. Multiplanar CT image reconstructions and MIPs were obtained to evaluate the vascular anatomy. Multidetector CT imaging of the abdomen and pelvis was performed using the standard protocol during bolus administration of intravenous contrast. RADIATION DOSE REDUCTION: This exam was performed according to the departmental dose-optimization program which includes automated exposure control, adjustment of the mA and/or kV according to  patient size and/or use of iterative reconstruction technique. CONTRAST:  75mL OMNIPAQUE  IOHEXOL  350 MG/ML SOLN COMPARISON:  CT abdomen/pelvis 05/29/2024 and CT chest, abdomen and pelvis 03/28/2024 FINDINGS: CTA CHEST FINDINGS Cardiovascular: Heart is normal size. Thoracic aorta is normal in caliber. Pulmonary arterial system is well opacified without evidence of emboli. Remaining vascular structures are unremarkable. Mediastinum/Nodes: No mediastinal or hilar adenopathy. Remaining mediastinal structures are unremarkable. Lungs/Pleura: Lungs are adequately inflated without focal airspace consolidation or effusion. Interval resolution of previously seen effusions and bibasilar compressive atelectasis. Airways are normal. Musculoskeletal: No focal abnormality. Review of the MIP images confirms the above findings. CT ABDOMEN and PELVIS FINDINGS Hepatobiliary: Interval cholecystectomy. Minimal fluid over the gallbladder fossa. Liver and biliary tree are normal. Pancreas: Normal. Spleen: Normal. Adrenals/Urinary Tract: Adrenal glands are normal. Kidneys normal size without hydronephrosis or nephrolithiasis. Ureters and bladder are unremarkable. Stomach/Bowel: Stomach and small bowel are normal. Appendix is normal. Mild decompression and wall thickening of the transverse colon with hazy adjacent mesentery which could be seen due to mild acute colitis. Vascular/Lymphatic: Vascular structures are  unremarkable. No adenopathy. Reproductive: Uterus and bilateral adnexa are unremarkable. Other: No significant free fluid. Musculoskeletal: No focal abnormality. Review of the MIP images confirms the above findings. IMPRESSION: 1. No evidence of pulmonary embolism. No acute cardiopulmonary disease. 2. Interval cholecystectomy with minimal fluid over the gallbladder fossa. 3. Mild decompression and wall thickening of the transverse colon with hazy adjacent mesentery which could be seen due to mild acute colitis. Electronically Signed   By: Toribio Agreste M.D.   On: 06/21/2024 16:58   CT ABDOMEN PELVIS W CONTRAST Result Date: 06/21/2024 CLINICAL DATA:  Epigastric pain and chest pain. Suspect pulmonary embolism. Cholecystectomy 06/16/2024 EXAM: CT ANGIOGRAPHY CHEST CT ABDOMEN AND PELVIS WITH CONTRAST TECHNIQUE: Multidetector CT imaging of the chest was performed using the standard protocol during bolus administration of intravenous contrast. Multiplanar CT image reconstructions and MIPs were obtained to evaluate the vascular anatomy. Multidetector CT imaging of the abdomen and pelvis was performed using the standard protocol during bolus administration of intravenous contrast. RADIATION DOSE REDUCTION: This exam was performed according to the departmental dose-optimization program which includes automated exposure control, adjustment of the mA and/or kV according to patient size and/or use of iterative reconstruction technique. CONTRAST:  75mL OMNIPAQUE  IOHEXOL  350 MG/ML SOLN COMPARISON:  CT abdomen/pelvis 05/29/2024 and CT chest, abdomen and pelvis 03/28/2024 FINDINGS: CTA CHEST FINDINGS Cardiovascular: Heart is normal size. Thoracic aorta is normal in caliber. Pulmonary arterial system is well opacified without evidence of emboli. Remaining vascular structures are unremarkable. Mediastinum/Nodes: No mediastinal or hilar adenopathy. Remaining mediastinal structures are unremarkable. Lungs/Pleura: Lungs are adequately  inflated without focal airspace consolidation or effusion. Interval resolution of previously seen effusions and bibasilar compressive atelectasis. Airways are normal. Musculoskeletal: No focal abnormality. Review of the MIP images confirms the above findings. CT ABDOMEN and PELVIS FINDINGS Hepatobiliary: Interval cholecystectomy. Minimal fluid over the gallbladder fossa. Liver and biliary tree are normal. Pancreas: Normal. Spleen: Normal. Adrenals/Urinary Tract: Adrenal glands are normal. Kidneys normal size without hydronephrosis or nephrolithiasis. Ureters and bladder are unremarkable. Stomach/Bowel: Stomach and small bowel are normal. Appendix is normal. Mild decompression and wall thickening of the transverse colon with hazy adjacent mesentery which could be seen due to mild acute colitis. Vascular/Lymphatic: Vascular structures are unremarkable. No adenopathy. Reproductive: Uterus and bilateral adnexa are unremarkable. Other: No significant free fluid. Musculoskeletal: No focal abnormality. Review of the MIP images confirms the above findings. IMPRESSION: 1. No evidence of  pulmonary embolism. No acute cardiopulmonary disease. 2. Interval cholecystectomy with minimal fluid over the gallbladder fossa. 3. Mild decompression and wall thickening of the transverse colon with hazy adjacent mesentery which could be seen due to mild acute colitis. Electronically Signed   By: Toribio Agreste M.D.   On: 06/21/2024 16:58   DG Chest Portable 1 View Result Date: 06/21/2024 CLINICAL DATA:  Chest pain and shortness of breath 1 week. EXAM: PORTABLE CHEST 1 VIEW COMPARISON:  06/18/2024 FINDINGS: Lungs are adequately inflated and otherwise clear. Cardiomediastinal silhouette and remainder of the exam is unchanged. IMPRESSION: No active disease. Electronically Signed   By: Toribio Agreste M.D.   On: 06/21/2024 15:18    ROS - all of the below systems have been reviewed with the patient and positives are indicated with bold  text General: chills, fever or night sweats Eyes: blurry vision or double vision ENT: epistaxis or sore throat Allergy/Immunology: itchy/watery eyes or nasal congestion Hematologic/Lymphatic: bleeding problems, blood clots or swollen lymph nodes Endocrine: temperature intolerance or unexpected weight changes Breast: new or changing breast lumps or nipple discharge Resp: cough, shortness of breath, or wheezing CV: chest pain or dyspnea on exertion GI: as per HPI GU: dysuria, trouble voiding, or hematuria MSK: joint pain or joint stiffness Neuro: TIA or stroke symptoms Derm: pruritus and skin lesion changes Psych: anxiety and depression  PE Blood pressure 111/70, pulse 84, temperature 98.5 F (36.9 C), temperature source Oral, resp. rate (!) 25, height 5' 1 (1.549 m), weight 50.3 kg, last menstrual period 05/27/2024, SpO2 98%, currently breastfeeding. Constitutional: NAD; conversant; no deformities Eyes: Moist conjunctiva; no lid lag; anicteric; PERRL Neck: Trachea midline; no thyromegaly Lungs: Normal respiratory effort; no tactile fremitus CV: RRR; no palpable thrills; no pitting edema GI: Abd soft, NT/ND; no palpable hepatosplenomegaly MSK: Normal range of motion of extremities; no clubbing/cyanosis Psychiatric: Appropriate affect; alert and oriented x3 Lymphatic: No palpable cervical or axillary lymphadenopathy  Results for orders placed or performed during the hospital encounter of 06/21/24 (from the past 48 hours)  CBC     Status: None   Collection Time: 06/21/24  2:12 PM  Result Value Ref Range   WBC 9.5 4.0 - 10.5 K/uL   RBC 5.02 3.87 - 5.11 MIL/uL   Hemoglobin 14.9 12.0 - 15.0 g/dL   HCT 56.4 63.9 - 53.9 %   MCV 86.7 80.0 - 100.0 fL   MCH 29.7 26.0 - 34.0 pg   MCHC 34.3 30.0 - 36.0 g/dL   RDW 88.0 88.4 - 84.4 %   Platelets 350 150 - 400 K/uL   nRBC 0.0 0.0 - 0.2 %    Comment: Performed at [facility], [address], [place], [place] [address]  hCG, serum,  qualitative     Status: None   Collection Time: 06/21/24  2:12 PM  Result Value Ref Range   Preg, Serum NEGATIVE NEGATIVE    Comment:        THE SENSITIVITY OF THIS METHODOLOGY IS >10 mIU/mL. Performed at [facility], [address] [place], [place] [address]   Lipase, blood     Status: None   Collection Time: 06/21/24  2:35 PM  Result Value Ref Range   Lipase 19 11 - 51 U/L    Comment: Performed at [facility], [address], [place], [place] [address]  Basic metabolic panel with GFR     Status: Abnormal   Collection Time: 06/21/24  2:57 PM  Result  Value Ref Range   Sodium 137 135 - 145 mmol/L   Potassium 3.9 3.5 - 5.1 mmol/L    Comment: HEMOLYSIS AT THIS LEVEL MAY AFFECT RESULT   Chloride 105 98 - 111 mmol/L   CO2 22 22 - 32 mmol/L   Glucose, Bld 110 (H) 70 - 99 mg/dL    Comment: Glucose reference range applies only to samples taken after fasting for at least 8 hours.   BUN <5 (L) 6 - 20 mg/dL   Creatinine, Ser 9.58 (L) 0.44 - 1.00 mg/dL   Calcium  9.8 8.9 - 10.3 mg/dL   GFR, Estimated >39 >39 mL/min    Comment: (NOTE) Calculated using the CKD-EPI Creatinine Equation (2021)    Anion gap 10 5 - 15    Comment: Performed at [facility], [address] [place], [place] [address]  Troponin I (High Sensitivity)     Status: None   Collection Time: 06/21/24  2:57 PM  Result Value Ref Range   Troponin I (High Sensitivity) 3 <18 ng/L    Comment: (NOTE) Elevated high sensitivity troponin I (hsTnI) values and significant  changes across serial measurements may suggest ACS but many other  chronic and acute conditions are known to elevate hsTnI results.  Refer to the Links section for chest pain algorithms and additional  guidance. Performed at [facility], [address], [place], [place] [address]   Hepatic function panel     Status: Abnormal   Collection Time: 06/21/24  2:57 PM  Result Value Ref Range   Total Protein 7.8 6.5 - 8.1 g/dL    Albumin  4.1 3.5 - 5.0 g/dL   AST 790 (H) 15 - 41 U/L    Comment: HEMOLYSIS AT THIS LEVEL MAY AFFECT RESULT   ALT 702 (H) 0 - 44 U/L    Comment: HEMOLYSIS AT THIS LEVEL MAY AFFECT RESULT   Alkaline Phosphatase 255 (H) 38 - 126 U/L   Total Bilirubin 1.9 (H) 0.0 - 1.2 mg/dL    Comment: HEMOLYSIS AT THIS LEVEL MAY AFFECT RESULT   Bilirubin, Direct 0.6 (H) 0.0 - 0.2 mg/dL   Indirect Bilirubin 1.3 (H) 0.3 - 0.9 mg/dL    Comment: Performed at [facility], [address] [place], [place] [address]    CT Angio Chest PE W and/or Wo Contrast Result Date: 06/21/2024 CLINICAL DATA:  Epigastric pain and chest pain. Suspect pulmonary embolism. Cholecystectomy 06/16/2024 EXAM: CT ANGIOGRAPHY CHEST CT ABDOMEN AND PELVIS WITH CONTRAST TECHNIQUE: Multidetector CT imaging of the chest was performed using the standard protocol during bolus administration of intravenous contrast. Multiplanar CT image reconstructions and MIPs were obtained to evaluate the vascular anatomy. Multidetector CT imaging of the abdomen and pelvis was performed using the standard protocol during bolus administration of intravenous contrast. RADIATION DOSE REDUCTION: This exam was performed according to the departmental dose-optimization program which includes automated exposure control, adjustment of the mA and/or kV according to patient size and/or use of iterative reconstruction technique. CONTRAST:  75mL OMNIPAQUE  IOHEXOL  350 MG/ML SOLN COMPARISON:  CT abdomen/pelvis 05/29/2024 and CT chest, abdomen and pelvis 03/28/2024 FINDINGS: CTA CHEST FINDINGS Cardiovascular: Heart is normal size. Thoracic aorta is normal in caliber. Pulmonary arterial system is well opacified without evidence of emboli. Remaining vascular structures are unremarkable. Mediastinum/Nodes: No mediastinal or hilar adenopathy. Remaining mediastinal structures are unremarkable. Lungs/Pleura: Lungs are adequately inflated without focal airspace consolidation or effusion.  Interval resolution of previously seen effusions and bibasilar compressive atelectasis. Airways  are normal. Musculoskeletal: No focal abnormality. Review of the MIP images confirms the above findings. CT ABDOMEN and PELVIS FINDINGS Hepatobiliary: Interval cholecystectomy. Minimal fluid over the gallbladder fossa. Liver and biliary tree are normal. Pancreas: Normal. Spleen: Normal. Adrenals/Urinary Tract: Adrenal glands are normal. Kidneys normal size without hydronephrosis or nephrolithiasis. Ureters and bladder are unremarkable. Stomach/Bowel: Stomach and small bowel are normal. Appendix is normal. Mild decompression and wall thickening of the transverse colon with hazy adjacent mesentery which could be seen due to mild acute colitis. Vascular/Lymphatic: Vascular structures are unremarkable. No adenopathy. Reproductive: Uterus and bilateral adnexa are unremarkable. Other: No significant free fluid. Musculoskeletal: No focal abnormality. Review of the MIP images confirms the above findings. IMPRESSION: 1. No evidence of pulmonary embolism. No acute cardiopulmonary disease. 2. Interval cholecystectomy with minimal fluid over the gallbladder fossa. 3. Mild decompression and wall thickening of the transverse colon with hazy adjacent mesentery which could be seen due to mild acute colitis. Electronically Signed   By: Toribio Agreste M.D.   On: 06/21/2024 16:58   CT ABDOMEN PELVIS W CONTRAST Result Date: 06/21/2024 CLINICAL DATA:  Epigastric pain and chest pain. Suspect pulmonary embolism. Cholecystectomy 06/16/2024 EXAM: CT ANGIOGRAPHY CHEST CT ABDOMEN AND PELVIS WITH CONTRAST TECHNIQUE: Multidetector CT imaging of the chest was performed using the standard protocol during bolus administration of intravenous contrast. Multiplanar CT image reconstructions and MIPs were obtained to evaluate the vascular anatomy. Multidetector CT imaging of the abdomen and pelvis was performed using the standard protocol during bolus  administration of intravenous contrast. RADIATION DOSE REDUCTION: This exam was performed according to the departmental dose-optimization program which includes automated exposure control, adjustment of the mA and/or kV according to patient size and/or use of iterative reconstruction technique. CONTRAST:  75mL OMNIPAQUE  IOHEXOL  350 MG/ML SOLN COMPARISON:  CT abdomen/pelvis 05/29/2024 and CT chest, abdomen and pelvis 03/28/2024 FINDINGS: CTA CHEST FINDINGS Cardiovascular: Heart is normal size. Thoracic aorta is normal in caliber. Pulmonary arterial system is well opacified without evidence of emboli. Remaining vascular structures are unremarkable. Mediastinum/Nodes: No mediastinal or hilar adenopathy. Remaining mediastinal structures are unremarkable. Lungs/Pleura: Lungs are adequately inflated without focal airspace consolidation or effusion. Interval resolution of previously seen effusions and bibasilar compressive atelectasis. Airways are normal. Musculoskeletal: No focal abnormality. Review of the MIP images confirms the above findings. CT ABDOMEN and PELVIS FINDINGS Hepatobiliary: Interval cholecystectomy. Minimal fluid over the gallbladder fossa. Liver and biliary tree are normal. Pancreas: Normal. Spleen: Normal. Adrenals/Urinary Tract: Adrenal glands are normal. Kidneys normal size without hydronephrosis or nephrolithiasis. Ureters and bladder are unremarkable. Stomach/Bowel: Stomach and small bowel are normal. Appendix is normal. Mild decompression and wall thickening of the transverse colon with hazy adjacent mesentery which could be seen due to mild acute colitis. Vascular/Lymphatic: Vascular structures are unremarkable. No adenopathy. Reproductive: Uterus and bilateral adnexa are unremarkable. Other: No significant free fluid. Musculoskeletal: No focal abnormality. Review of the MIP images confirms the above findings. IMPRESSION: 1. No evidence of pulmonary embolism. No acute cardiopulmonary disease. 2.  Interval cholecystectomy with minimal fluid over the gallbladder fossa. 3. Mild decompression and wall thickening of the transverse colon with hazy adjacent mesentery which could be seen due to mild acute colitis. Electronically Signed   By: Toribio Agreste M.D.   On: 06/21/2024 16:58   DG Chest Portable 1 View Result Date: 06/21/2024 CLINICAL DATA:  Chest pain and shortness of breath 1 week. EXAM: PORTABLE CHEST 1 VIEW COMPARISON:  06/18/2024 FINDINGS: Lungs are adequately inflated and otherwise clear. Cardiomediastinal  silhouette and remainder of the exam is unchanged. IMPRESSION: No active disease. Electronically Signed   By: Toribio Agreste M.D.   On: 06/21/2024 15:18    A/P: Toni Klein is an 23 y.o. female s/p interval cholecystectomy 06/16/24  - Transaminitis with mildly elevated bilirubin, more indirect however; discussed with Legrand Angle to check acetaminophen  level.   - Also plan for MRCP to check for potential choledocholithiasis and biliary anatomy.  There were no stones noted on her pathology or on the imaging workup before. - Admit - NPO for time being - We spent time discussing all this with her as well as the plan.  All of her questions were answered, she expressed understanding, and agreement with the plan.  Lonni Pizza, MD [facility]

## 2024-06-21 NOTE — ED Triage Notes (Signed)
 C/O substernal chest pain and SHOB for a week. C/O nausea.

## 2024-06-21 NOTE — ED Provider Notes (Signed)
 Accepted handoff at shift change from Barrett, PA-C. Please see prior provider note for more detail.   Briefly: Patient is 23 y.o.   DDX: concern for post-op infection, phlegmon, abscess, PE, pneumonia, bowel obstruction  Plan: Disposition per CT imaging and symptomatic improvement. Can follow up with general surgery if workup is reassuring and symptomatically stable.  Surgery on 06/16/2024 with Dr. Paola as Careers adviser.  Physical Exam  BP 112/72   Pulse 75   Temp 98.8 F (37.1 C) (Oral)   Resp (!) 21   Ht 5' 1 (1.549 m)   Wt 50.3 kg   LMP 05/27/2024   SpO2 98%   Breastfeeding Yes   BMI 20.95 kg/m   Physical Exam Vitals and nursing note reviewed.  Constitutional:      General: She is not in acute distress.    Appearance: She is not toxic-appearing.  HENT:     Head: Normocephalic and atraumatic.  Eyes:     General: No scleral icterus.    Conjunctiva/sclera: Conjunctivae normal.  Cardiovascular:     Rate and Rhythm: Normal rate and regular rhythm.     Pulses: Normal pulses.     Heart sounds: Normal heart sounds.  Pulmonary:     Effort: Pulmonary effort is normal. No respiratory distress.     Breath sounds: Normal breath sounds.  Chest:     Chest wall: Tenderness present.  Abdominal:     General: Abdomen is flat. Bowel sounds are normal.     Palpations: Abdomen is soft.     Tenderness: There is abdominal tenderness.     Comments: Incision site from recent cholecystectomy clean dry and intact. Patient with significant tenderness to palpation to right upper quadrant and epigastric area.  Musculoskeletal:     Right lower leg: No edema.     Left lower leg: No edema.  Skin:    General: Skin is warm and dry.     Findings: No lesion.  Neurological:     General: No focal deficit present.     Mental Status: She is alert and oriented to person, place, and time. Mental status is at baseline.     Procedures  Procedures  ED Course / MDM    Medical Decision  Making Amount and/or Complexity of Data Reviewed Labs: ordered. Radiology: ordered.  Risk Prescription drug management. Decision regarding hospitalization.   Patient is a hand off from Barrett, PA-C. Please see their note for full HPI and physical exam findings. In brief, patient here about 1 week post-laparoscopic cholecystectomy. Has been having RUQ and epigastric abdominal pain as well as SOB. No reported fever, chills, or bodyaches, but has had some vomiting. Given recent surgery, has not follow up with general surgery. Plan is reassessment after medications and disposition per results of imaging. If workup negative, can follow up with general surgery.  Patient's LFTs are worrisome for upward trending. While this may be somewhat expected post-op, the elevation appears somewhat extreme. CT angio chest and CTAP negative for any acute findings although a small amount of fluid seen at the gallbladder fossa. Otherwise normal. Consult to general surgery placed.  Spoke with Dr. Teresa, general surgery, who advised patient likely requires MRCP and admission given LFT elevation. Also advised adding on Tylenol  levels. General surgery service will be admitting patient.    Alane Hanssen A, PA-C 06/21/24 1927    Patt Alm Macho, MD 06/22/24 [phone]

## 2024-06-21 NOTE — ED Notes (Signed)
 RN introduced self to patient.  RN explained that patient has room upstairs and will be transported shortly.  Transport called and states they will come shortly to transport.

## 2024-06-22 ENCOUNTER — Inpatient Hospital Stay (HOSPITAL_COMMUNITY): Payer: MEDICAID

## 2024-06-22 DIAGNOSIS — K8050 Calculus of bile duct without cholangitis or cholecystitis without obstruction: Principal | ICD-10-CM

## 2024-06-22 DIAGNOSIS — K838 Other specified diseases of biliary tract: Secondary | ICD-10-CM

## 2024-06-22 DIAGNOSIS — R7989 Other specified abnormal findings of blood chemistry: Secondary | ICD-10-CM

## 2024-06-22 DIAGNOSIS — Z9049 Acquired absence of other specified parts of digestive tract: Secondary | ICD-10-CM

## 2024-06-22 LAB — PROTIME-INR
INR: 0.9 (ref 0.8–1.2)
Prothrombin Time: 13.2 s (ref 11.4–15.2)

## 2024-06-22 LAB — CBC
HCT: 38.9 % (ref 36.0–46.0)
Hemoglobin: 13.4 g/dL (ref 12.0–15.0)
MCH: 30.1 pg (ref 26.0–34.0)
MCHC: 34.4 g/dL (ref 30.0–36.0)
MCV: 87.4 fL (ref 80.0–100.0)
Platelets: 315 K/uL (ref 150–400)
RBC: 4.45 MIL/uL (ref 3.87–5.11)
RDW: 11.9 % (ref 11.5–15.5)
WBC: 6.9 K/uL (ref 4.0–10.5)
nRBC: 0 % (ref 0.0–0.2)

## 2024-06-22 LAB — COMPREHENSIVE METABOLIC PANEL WITH GFR
ALT: 547 U/L — ABNORMAL HIGH (ref 0–44)
AST: 163 U/L — ABNORMAL HIGH (ref 15–41)
Albumin: 3.7 g/dL (ref 3.5–5.0)
Alkaline Phosphatase: 254 U/L — ABNORMAL HIGH (ref 38–126)
Anion gap: 14 (ref 5–15)
BUN: <5 mg/dL — ABNORMAL LOW (ref 6–20)
CO2: 18 mmol/L — ABNORMAL LOW (ref 22–32)
Calcium: 9.3 mg/dL (ref 8.9–10.3)
Chloride: 106 mmol/L (ref 98–111)
Creatinine, Ser: 0.39 mg/dL — ABNORMAL LOW (ref 0.44–1.00)
GFR, Estimated: >60 mL/min (ref >60)
Glucose, Bld: 73 mg/dL (ref 70–99)
Potassium: 3.6 mmol/L (ref 3.5–5.1)
Sodium: 138 mmol/L (ref 135–145)
Total Bilirubin: 1.1 mg/dL (ref 0.0–1.2)
Total Protein: 7 g/dL (ref 6.5–8.1)

## 2024-06-22 MED ORDER — GADOBUTROL 1 MMOL/ML IV SOLN
5.0000 mL | Freq: Once | INTRAVENOUS | Status: AC | PRN
  Administered 2024-06-22: 5 mL via INTRAVENOUS

## 2024-06-22 MED ORDER — OXYCODONE HCL 5 MG PO TABS
5.0000 mg | ORAL_TABLET | Freq: Four times a day (QID) | ORAL | Status: DC | PRN
  Administered 2024-06-22 – 2024-06-24 (×5): 5 mg via ORAL
  Filled 2024-06-22 (×5): qty 1

## 2024-06-22 MED ORDER — HYDROMORPHONE HCL 1 MG/ML IJ SOLN
0.5000 mg | INTRAMUSCULAR | Status: DC | PRN
  Administered 2024-06-22 – 2024-06-23 (×6): 1 mg via INTRAVENOUS
  Administered 2024-06-25: 0.5 mg via INTRAVENOUS
  Filled 2024-06-22 (×7): qty 1

## 2024-06-22 NOTE — Consult Note (Signed)
 Consultation  Referring Provider: [facility] Primary Care Physician:  Pcp, No Primary Gastroenterologist:  unassigned  Reason for Consultation: Choledocholithiasis  HPI: Toni Klein is a 23 y.o. 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 She also required open cholecystostomy tube placement at the time of the C-section on 03/27/2024 with concerns for acute cholecystitis.  She was able to be discharged home on 04/05/2024 and was then came back to the emergency room on 728 with complaints of right upper quadrant pain.  She had been seen outpatient by surgery and was actually scheduled for laparoscopic cholecystectomy. She underwent lap chole on 06/16/2024 no IOC was done however LFTs were normal on the day of admission for that procedure.  She presented back to the emergency room yesterday with complaints of abdominal pain, and a sensation of lower chest discomfort and shortness of breath off and on for about 5 days.  Pain became worse and was associated with nausea and vomiting.  She had chills but no documented fever at home.  Workup in the ER with CTA of the chest ruled out pulmonary embolism, then CT abdomen and pelvis showed some minimal fluid in the gallbladder fossa, no ductal dilation, Normal appearing pancreas.  There is mild decompression and possible wall thickening of the transverse colon with hazy adjacent mesentery query acute colitis.  MRI/MRCP-with expected postoperative changes from recent cholecystectomy, common bile duct 8 mm with mild intrahepatic ductal dilation and multiple stones within the distal common bile duct measuring 3 mm.  Labs on admit yesterday WBC 9.5/hemoglobin 14.9/hematocrit 43.5 Lipase within normal limits Troponin negative T. bili 1.9/alk phos 255/AST 209/ALT  702 Potassium 3.9/BUN less than 5/creatinine 0.41 Beta-hCG negative  This a.m.-WBC 6.9/hemoglobin 13.4/hematocrit 38 point K 3.6 T. bili 1.1/alk phos 254/AST 163/ALT 547 INR pending  She has not been on IV antibiotics She says her pain is little bit better today than on admission though still present, she has not had any nausea or vomiting today, has been hemodynamically stable and afebrile.   Past Medical History:  Diagnosis Date   Blood transfusion without reported diagnosis 2025   DIC (disseminated intravascular coagulation) (HCC)    Ectopic pregnancy    Methotrexate    HELLP (hemolytic anemia/elev liver enzymes/low platelets in pregnancy) 2025   Hemolysis, elevated liver enzymes, and low platelet (HELLP) syndrome during pregnancy, antepartum 03/27/2024    Past Surgical History:  Procedure Laterality Date   CESAREAN SECTION N/A 03/27/2024   Procedure: CESAREAN DELIVERY;  Surgeon: Cris Burnard DEL, MD;  Location: [facility] OR;  Service: Obstetrics;  Laterality: N/A;   CHOLECYSTECTOMY N/A 06/16/2024   Procedure: LAPAROSCOPIC CHOLECYSTECTOMY;  Surgeon: Paola Dreama SAILOR, MD;  Location: [facility] OR;  Service: General;  Laterality: N/A;   EXAM UNDER ANESTHESIA, PELVIC N/A 03/27/2024   Procedure: EXAM UNDER ANESTHESIA, ABDOMINAL WITH CHOLECYSTOTOMY TUBE INSERTION;  Surgeon: Paola Dreama SAILOR, MD;  Location: [facility] OR;  Service: General;  Laterality: N/A;   IR CHOLANGIOGRAM EXISTING TUBE  05/19/2024   NO PAST SURGERIES     TOOTH EXTRACTION      Prior to Admission medications   Medication Sig Start Date End Date Taking? Authorizing Provider  acetaminophen  (TYLENOL ) 500 MG tablet Take 2 tablets (1,000 mg total) by mouth every 6 (six) hours. 06/16/24   Paola Dreama SAILOR, MD  ibuprofen  (ADVIL ) 600 MG tablet Take 1 tablet (  600 mg total) by mouth every 8 (eight) hours as needed. 06/16/24   Paola Dreama SAILOR, MD  methocarbamol  (ROBAXIN ) 750 MG tablet Take 1 tablet (750 mg total) by mouth 4 (four) times daily. 06/16/24    Paola Dreama SAILOR, MD  NIFEdipine  (ADALAT  CC) 30 MG 24 hr tablet Take 1 tablet (30 mg total) by mouth daily. Patient not taking: Reported on 05/07/2024 04/02/24   Fredirick Glenys RAMAN, MD  nitrofurantoin , macrocrystal-monohydrate, (MACROBID ) 100 MG capsule Take 1 capsule (100 mg total) by mouth 2 (two) times daily. 05/07/24   Cresenzo, Norleen GAILS, MD  oxyCODONE  (OXY IR/ROXICODONE ) 5 MG immediate release tablet Take 1 tablet (5 mg total) by mouth every 4 (four) hours as needed for severe pain (pain score 7-10). 06/16/24   Paola Dreama SAILOR, MD  polyethylene glycol (MIRALAX ) 17 g packet Take 17 g by mouth daily. 06/16/24   Paola Dreama SAILOR, MD  Prenatal Vit-Fe Fumarate-FA (MULTIVITAMIN-PRENATAL) 27-0.8 MG TABS tablet Take 1 tablet by mouth daily at 12 noon.    [provider]    Current Facility-Administered Medications  Medication Dose Route Frequency Provider Last Rate Last Admin   diphenhydrAMINE  (BENADRYL ) 12.5 MG/5ML elixir 12.5 mg  12.5 mg Oral Q6H PRN Teresa Lonni HERO, MD       Or   diphenhydrAMINE  (BENADRYL ) injection 12.5 mg  12.5 mg Intravenous Q6H PRN Teresa Lonni HERO, MD       hydrALAZINE  (APRESOLINE ) injection 10 mg  10 mg Intravenous Q2H PRN Teresa Lonni HERO, MD       HYDROmorphone  (DILAUDID ) injection 0.5-1 mg  0.5-1 mg Intravenous Q2H PRN Signe Mitzie LABOR, MD       ibuprofen  (ADVIL ) tablet 600 mg  600 mg Oral Q6H PRN Teresa Lonni HERO, MD       lactated ringers  infusion   Intravenous Continuous Teresa Lonni HERO, MD 100 mL/hr at 06/22/24 9366 New Bag at 06/22/24 9366   melatonin tablet 3 mg  3 mg Oral QHS PRN Teresa Lonni HERO, MD       ondansetron  (ZOFRAN -ODT) disintegrating tablet 4 mg  4 mg Oral Q6H PRN Teresa Lonni HERO, MD       Or   ondansetron  (ZOFRAN ) injection 4 mg  4 mg Intravenous Q6H PRN Teresa Lonni HERO, MD       oxyCODONE  (Oxy IR/ROXICODONE ) immediate release tablet 5 mg  5 mg Oral Q6H PRN Signe Mitzie LABOR, MD       polyethylene glycol  (MIRALAX  / GLYCOLAX ) packet 17 g  17 g Oral Daily Teresa Lonni HERO, MD       simethicone  (MYLICON) chewable tablet 40 mg  40 mg Oral Q6H PRN Teresa Lonni HERO, MD        Allergies as of 06/21/2024   (No Known Allergies)    Family History  Problem Relation Age of Onset   Healthy Mother    Healthy Father     Social History   Socioeconomic History   Marital status: Single    Spouse name: Not on file   Number of children: Not on file   Years of education: Not on file   Highest education level: Not on file  Occupational History   Not on file  Tobacco Use   Smoking status: Never   Smokeless tobacco: Never  Vaping Use   Vaping status: Never Used  Substance and Sexual Activity   Alcohol use: Never   Drug use: Never   Sexual activity: Not Currently  Other Topics Concern  Not on file  Social History Narrative   ** Merged History Encounter **       Social Drivers of Health   Financial Resource Strain: Not on file  Food Insecurity: No Food Insecurity (06/21/2024)   Hunger Vital Sign    Worried About Running Out of Food in the Last Year: Never true    Ran Out of Food in the Last Year: Never true  Transportation Needs: No Transportation Needs (06/21/2024)   PRAPARE - Administrator, Civil Service (Medical): No    Lack of Transportation (Non-Medical): No  Physical Activity: Not on file  Stress: Not on file  Social Connections: Not on file  Intimate Partner Violence: Not At Risk (06/21/2024)   Humiliation, Afraid, Rape, and Kick questionnaire    Fear of Current or Ex-Partner: No    Emotionally Abused: No    Physically Abused: No    Sexually Abused: No    Review of Systems: Pertinent positive and negative review of systems were noted in the above HPI section.  All other review of systems was otherwise negative.   Physical Exam: Vital signs in last 24 hours: Temp:  [98.3 F (36.8 C)-99.1 F (37.3 C)] 98.6 F (37 C) (08/03 0756) Pulse Rate:  [71-92] 71  (08/03 0756) Resp:  [16-25] 17 (08/03 0756) BP: (100-148)/(65-96) 100/65 (08/03 0756) SpO2:  [97 %-99 %] 99 % (08/03 0756) Weight:  [50.3 kg] 50.3 kg (08/02 1418)   General:   Alert,  Well-developed, well-nourished, young Hispanic female pleasant and cooperative in NAD Head:  Normocephalic and atraumatic. Eyes:  Sclera clear, no icterus.   Conjunctiva pink. Ears:  Normal auditory acuity. Nose:  No deformity, discharge,  or lesions. Mouth:  No deformity or lesions.   Neck:  Supple; no masses or thyromegaly. Lungs:  Clear throughout to auscultation.   No wheezes, crackles, or rhonchi.  Heart:  Regular rate and rhythm; no murmurs, clicks, rubs,  or gallops. Abdomen:  Soft, tender across the upper abdomen with some guarding, no rebound BS active,nonpalp mass or hsm.   Rectal: not done Msk:  Symmetrical without gross deformities. . Pulses:  Normal pulses noted. Extremities:  Without clubbing or edema. Neurologic:  Alert and  oriented x4;  grossly normal neurologically. Skin:  Intact without significant lesions or rashes.. Psych:  Alert and cooperative. Normal mood and affect.  Intake/Output from previous day: No intake/output data recorded. Intake/Output this shift: No intake/output data recorded.  Lab Results: Recent Labs    06/21/24 1412 06/22/24 0207  WBC 9.5 6.9  HGB 14.9 13.4  HCT 43.5 38.9  PLT 350 315   BMET Recent Labs    06/21/24 1457 06/22/24 0207  NA 137 138  K 3.9 3.6  CL 105 106  CO2 22 18*  GLUCOSE 110* 73  BUN <5* <5*  CREATININE 0.41* 0.39*  CALCIUM  9.8 9.3   LFT Recent Labs    06/21/24 1457 06/22/24 0207  PROT 7.8 7.0  ALBUMIN  4.1 3.7  AST 209* 163*  ALT 702* 547*  ALKPHOS 255* 254*  BILITOT 1.9* 1.1  BILIDIR 0.6*  --   IBILI 1.3*  --    PT/INR No results for input(s): LABPROT, INR in the last 72 hours. Hepatitis Panel No results for input(s): HEPBSAG, HCVAB, HEPAIGM, HEPBIGM in the last 72 hours.    IMPRESSION:  #39  23 year old non-English-speaking Hispanic female who developed HELLP syndrome during recent pregnancy complicated by DIC, and underwent emergency C-section, and open cholecystostomy tube  placement on 03/27/2024.  Patient then underwent laparoscopic cholecystectomy on 06/16/2024, no IOC done but had normal LFTs preoperatively.  Patient came back to the emergency room yesterday with 4 to 5-day history of intermittent abdominal pain now progressive across the upper abdomen radiating to the back and associated with nausea and vomiting  Noted to have elevated LFTs, and workup including MRCP shows evidence of choledocholithiasis with several small stones in the common bile duct, expected postoperative gallbladder fossa fluid.  #2 nursing mother  Plan; Patient will be kept n.p.o. this morning  INR pending Patient will need ERCP with stone extraction.  We may be able to accomplish this today pending anesthesia availability, with Dr. [place]  I discussed the procedure via the mobile interpreter with the patient in detail at bedside today.  We discussed potential for anesthesia complications, bleeding, perforation, pancreatitis 3 to 4% risk and failure of procedure.  We reviewed biliary images.  All questions were answered and she is agreeable to proceed. If we are unable to get procedure scheduled for today then she will be okay for diet as tolerated and be placed on schedule for ERCP tomorrow afternoon. I discontinued Lovenox  which she did receive last night.   Geovonni Meyerhoff EsterwoodPA-C  06/22/2024, 9:48 AM

## 2024-06-22 NOTE — H&P (View-Only) (Signed)
 Consultation  Referring Provider: [facility]/ [facility] Primary Care Physician:  Pcp, No Primary Gastroenterologist:  unassigned  Reason for Consultation: Choledocholithiasis  HPI: Toni Klein is a 23 y.o. 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 She also required open cholecystostomy tube placement at the time of the C-section on 03/27/2024 with concerns for acute cholecystitis.  She was able to be discharged home on 04/05/2024 and was then came back to the emergency room on 728 with complaints of right upper quadrant pain.  She had been seen outpatient by surgery and was actually scheduled for laparoscopic cholecystectomy. She underwent lap chole on 06/16/2024 no IOC was done however LFTs were normal on the day of admission for that procedure.  She presented back to the emergency room yesterday with complaints of abdominal pain, and a sensation of lower chest discomfort and shortness of breath off and on for about 5 days.  Pain became worse and was associated with nausea and vomiting.  She had chills but no documented fever at home.  Workup in the ER with CTA of the chest ruled out pulmonary embolism, then CT abdomen and pelvis showed some minimal fluid in the gallbladder fossa, no ductal dilation, Normal appearing pancreas.  There is mild decompression and possible wall thickening of the transverse colon with hazy adjacent mesentery query acute colitis.  MRI/MRCP-with expected postoperative changes from recent cholecystectomy, common bile duct 8 mm with mild intrahepatic ductal dilation and multiple stones within the distal common bile duct measuring 3 mm.  Labs on admit yesterday WBC 9.5/hemoglobin 14.9/hematocrit 43.5 Lipase within normal limits Troponin negative T. bili 1.9/alk phos 255/AST 209/ALT  702 Potassium 3.9/BUN less than 5/creatinine 0.41 Beta-hCG negative  This a.m.-WBC 6.9/hemoglobin 13.4/hematocrit 38 point K 3.6 T. bili 1.1/alk phos 254/AST 163/ALT 547 INR pending  She has not been on IV antibiotics She says her pain is little bit better today than on admission though still present, she has not had any nausea or vomiting today, has been hemodynamically stable and afebrile.   Past Medical History:  Diagnosis Date   Blood transfusion without reported diagnosis 2025   DIC (disseminated intravascular coagulation) (HCC)    Ectopic pregnancy    Methotrexate    HELLP (hemolytic anemia/elev liver enzymes/low platelets in pregnancy) 2025   Hemolysis, elevated liver enzymes, and low platelet (HELLP) syndrome during pregnancy, antepartum 03/27/2024    Past Surgical History:  Procedure Laterality Date   CESAREAN SECTION N/A 03/27/2024   Procedure: CESAREAN DELIVERY;  Surgeon: Cris Burnard DEL, MD;  [facility] OR;  Service: Obstetrics;  Laterality: N/A;   CHOLECYSTECTOMY N/A 06/16/2024   Procedure: LAPAROSCOPIC CHOLECYSTECTOMY;  Surgeon: Paola Dreama SAILOR, MD;  Location: [facility] OR;  Service: General;  Laterality: N/A;   EXAM UNDER ANESTHESIA, PELVIC N/A 03/27/2024   Procedure: EXAM UNDER ANESTHESIA, ABDOMINAL WITH CHOLECYSTOTOMY TUBE INSERTION;  Surgeon: Paola Dreama SAILOR, MD;  Location: [facility] OR;  Service: General;  Laterality: N/A;   IR CHOLANGIOGRAM EXISTING TUBE  05/19/2024   NO PAST SURGERIES     TOOTH EXTRACTION      Prior to Admission medications   Medication Sig Start Date End Date Taking? Authorizing Provider  acetaminophen  (TYLENOL ) 500 MG tablet Take 2 tablets (1,000 mg total) by mouth every 6 (six) hours. 06/16/24   Paola Dreama SAILOR, MD  ibuprofen  (ADVIL ) 600 MG tablet Take 1 tablet (  600 mg total) by mouth every 8 (eight) hours as needed. 06/16/24   Paola Dreama SAILOR, MD  methocarbamol  (ROBAXIN ) 750 MG tablet Take 1 tablet (750 mg total) by mouth 4 (four) times daily. 06/16/24    Paola Dreama SAILOR, MD  NIFEdipine  (ADALAT  CC) 30 MG 24 hr tablet Take 1 tablet (30 mg total) by mouth daily. Patient not taking: Reported on 05/07/2024 04/02/24   Fredirick Glenys RAMAN, MD  nitrofurantoin , macrocrystal-monohydrate, (MACROBID ) 100 MG capsule Take 1 capsule (100 mg total) by mouth 2 (two) times daily. 05/07/24   Cresenzo, Norleen GAILS, MD  oxyCODONE  (OXY IR/ROXICODONE ) 5 MG immediate release tablet Take 1 tablet (5 mg total) by mouth every 4 (four) hours as needed for severe pain (pain score 7-10). 06/16/24   Paola Dreama SAILOR, MD  polyethylene glycol (MIRALAX ) 17 g packet Take 17 g by mouth daily. 06/16/24   Paola Dreama SAILOR, MD  Prenatal Vit-Fe Fumarate-FA (MULTIVITAMIN-PRENATAL) 27-0.8 MG TABS tablet Take 1 tablet by mouth daily at 12 noon.    [provider]    Current Facility-Administered Medications  Medication Dose Route Frequency Provider Last Rate Last Admin   diphenhydrAMINE  (BENADRYL ) 12.5 MG/5ML elixir 12.5 mg  12.5 mg Oral Q6H PRN Teresa Lonni HERO, MD       Or   diphenhydrAMINE  (BENADRYL ) injection 12.5 mg  12.5 mg Intravenous Q6H PRN Teresa Lonni HERO, MD       hydrALAZINE  (APRESOLINE ) injection 10 mg  10 mg Intravenous Q2H PRN Teresa Lonni HERO, MD       HYDROmorphone  (DILAUDID ) injection 0.5-1 mg  0.5-1 mg Intravenous Q2H PRN Signe Mitzie LABOR, MD       ibuprofen  (ADVIL ) tablet 600 mg  600 mg Oral Q6H PRN Teresa Lonni HERO, MD       lactated ringers  infusion   Intravenous Continuous Teresa Lonni HERO, MD 100 mL/hr at 06/22/24 9366 New Bag at 06/22/24 9366   melatonin tablet 3 mg  3 mg Oral QHS PRN Teresa Lonni HERO, MD       ondansetron  (ZOFRAN -ODT) disintegrating tablet 4 mg  4 mg Oral Q6H PRN Teresa Lonni HERO, MD       Or   ondansetron  (ZOFRAN ) injection 4 mg  4 mg Intravenous Q6H PRN Teresa Lonni HERO, MD       oxyCODONE  (Oxy IR/ROXICODONE ) immediate release tablet 5 mg  5 mg Oral Q6H PRN Signe Mitzie LABOR, MD       polyethylene glycol  (MIRALAX  / GLYCOLAX ) packet 17 g  17 g Oral Daily Teresa Lonni HERO, MD       simethicone  (MYLICON) chewable tablet 40 mg  40 mg Oral Q6H PRN Teresa Lonni HERO, MD        Allergies as of 06/21/2024   (No Known Allergies)    Family History  Problem Relation Age of Onset   Healthy Mother    Healthy Father     Social History   Socioeconomic History   Marital status: Single    Spouse name: Not on file   Number of children: Not on file   Years of education: Not on file   Highest education level: Not on file  Occupational History   Not on file  Tobacco Use   Smoking status: Never   Smokeless tobacco: Never  Vaping Use   Vaping status: Never Used  Substance and Sexual Activity   Alcohol use: Never   Drug use: Never   Sexual activity: Not Currently  Other Topics Concern  Not on file  Social History Narrative   ** Merged History Encounter **       Social Drivers of Health   Financial Resource Strain: Not on file  Food Insecurity: No Food Insecurity (06/21/2024)   Hunger Vital Sign    Worried About Running Out of Food in the Last Year: Never true    Ran Out of Food in the Last Year: Never true  Transportation Needs: No Transportation Needs (06/21/2024)   PRAPARE - Administrator, Civil Service (Medical): No    Lack of Transportation (Non-Medical): No  Physical Activity: Not on file  Stress: Not on file  Social Connections: Not on file  Intimate Partner Violence: Not At Risk (06/21/2024)   Humiliation, Afraid, Rape, and Kick questionnaire    Fear of Current or Ex-Partner: No    Emotionally Abused: No    Physically Abused: No    Sexually Abused: No    Review of Systems: Pertinent positive and negative review of systems were noted in the above HPI section.  All other review of systems was otherwise negative.   Physical Exam: Vital signs in last 24 hours: Temp:  [98.3 F (36.8 C)-99.1 F (37.3 C)] 98.6 F (37 C) (08/03 0756) Pulse Rate:  [71-92] 71  (08/03 0756) Resp:  [16-25] 17 (08/03 0756) BP: (100-148)/(65-96) 100/65 (08/03 0756) SpO2:  [97 %-99 %] 99 % (08/03 0756) Weight:  [50.3 kg] 50.3 kg (08/02 1418)   General:   Alert,  Well-developed, well-nourished, young Hispanic female pleasant and cooperative in NAD Head:  Normocephalic and atraumatic. Eyes:  Sclera clear, no icterus.   Conjunctiva pink. Ears:  Normal auditory acuity. Nose:  No deformity, discharge,  or lesions. Mouth:  No deformity or lesions.   Neck:  Supple; no masses or thyromegaly. Lungs:  Clear throughout to auscultation.   No wheezes, crackles, or rhonchi.  Heart:  Regular rate and rhythm; no murmurs, clicks, rubs,  or gallops. Abdomen:  Soft, tender across the upper abdomen with some guarding, no rebound BS active,nonpalp mass or hsm.   Rectal: not done Msk:  Symmetrical without gross deformities. . Pulses:  Normal pulses noted. Extremities:  Without clubbing or edema. Neurologic:  Alert and  oriented x4;  grossly normal neurologically. Skin:  Intact without significant lesions or rashes.. Psych:  Alert and cooperative. Normal mood and affect.  Intake/Output from previous day: No intake/output data recorded. Intake/Output this shift: No intake/output data recorded.  Lab Results: Recent Labs    06/21/24 1412 06/22/24 0207  WBC 9.5 6.9  HGB 14.9 13.4  HCT 43.5 38.9  PLT 350 315   BMET Recent Labs    06/21/24 1457 06/22/24 0207  NA 137 138  K 3.9 3.6  CL 105 106  CO2 22 18*  GLUCOSE 110* 73  BUN <5* <5*  CREATININE 0.41* 0.39*  CALCIUM  9.8 9.3   LFT Recent Labs    06/21/24 1457 06/22/24 0207  PROT 7.8 7.0  ALBUMIN  4.1 3.7  AST 209* 163*  ALT 702* 547*  ALKPHOS 255* 254*  BILITOT 1.9* 1.1  BILIDIR 0.6*  --   IBILI 1.3*  --    PT/INR No results for input(s): LABPROT, INR in the last 72 hours. Hepatitis Panel No results for input(s): HEPBSAG, HCVAB, HEPAIGM, HEPBIGM in the last 72 hours.    IMPRESSION:  #40  23 year old non-English-speaking Hispanic female who developed HELLP syndrome during recent pregnancy complicated by DIC, and underwent emergency C-section, and open cholecystostomy tube  placement on 03/27/2024.  Patient then underwent laparoscopic cholecystectomy on 06/16/2024, no IOC done but had normal LFTs preoperatively.  Patient came back to the emergency room yesterday with 4 to 5-day history of intermittent abdominal pain now progressive across the upper abdomen radiating to the back and associated with nausea and vomiting  Noted to have elevated LFTs, and workup including MRCP shows evidence of choledocholithiasis with several small stones in the common bile duct, expected postoperative gallbladder fossa fluid.  #2 nursing mother  Plan; Patient will be kept n.p.o. this morning  INR pending Patient will need ERCP with stone extraction.  We may be able to accomplish this today pending anesthesia availability, with Dr. Wilhelmenia  I discussed the procedure via the mobile interpreter with the patient in detail at bedside today.  We discussed potential for anesthesia complications, bleeding, perforation, pancreatitis 3 to 4% risk and failure of procedure.  We reviewed biliary images.  All questions were answered and she is agreeable to proceed. If we are unable to get procedure scheduled for today then she will be okay for diet as tolerated and be placed on schedule for ERCP tomorrow afternoon. I discontinued Lovenox  which she did receive last night.   Lakeesha Fontanilla EsterwoodPA-C  06/22/2024, 9:48 AM

## 2024-06-22 NOTE — Plan of Care (Signed)
   Problem: Education: Goal: Knowledge of General Education information will improve Description Including pain rating scale, medication(s)/side effects and non-pharmacologic comfort measures Outcome: Progressing   Problem: Health Behavior/Discharge Planning: Goal: Ability to manage health-related needs will improve Outcome: Progressing

## 2024-06-22 NOTE — Plan of Care (Signed)

## 2024-06-22 NOTE — Progress Notes (Signed)
 Subjective/Chief Complaint: Feels slightly better today   Objective: Vital signs in last 24 hours: Temp:  [98.3 F (36.8 C)-99.1 F (37.3 C)] 98.6 F (37 C) (08/03 0756) Pulse Rate:  [71-92] 71 (08/03 0756) Resp:  [16-25] 17 (08/03 0756) BP: (100-148)/(65-96) 100/65 (08/03 0756) SpO2:  [97 %-99 %] 99 % (08/03 0756) Weight:  [50.3 kg] 50.3 kg (08/02 1418)    Intake/Output from previous day: No intake/output data recorded. Intake/Output this shift: No intake/output data recorded.  Alert, well-appearing No distress Unlabored respirations Patient declines abdominal exam this morning  Lab Results:  Recent Labs    06/21/24 1412 06/22/24 0207  WBC 9.5 6.9  HGB 14.9 13.4  HCT 43.5 38.9  PLT 350 315   BMET Recent Labs    06/21/24 1457 06/22/24 0207  NA 137 138  K 3.9 3.6  CL 105 106  CO2 22 18*  GLUCOSE 110* 73  BUN <5* <5*  CREATININE 0.41* 0.39*  CALCIUM  9.8 9.3   PT/INR No results for input(s): LABPROT, INR in the last 72 hours. ABG No results for input(s): PHART, HCO3 in the last 72 hours.  Invalid input(s): PCO2, PO2  Studies/Results: MR 3D Recon At Scanner Result Date: 06/22/2024 EXAM: MR ABDOMEN WITHOUT IV CONTRAST. 06/22/2024 01:19:36 AM TECHNIQUE: Multisequence, multiplanar magnetic resonance images of the abdomen without intravenous contrast. COMPARISON: CT 06/21/2024 CLINICAL HISTORY: RUQ ABDOMINAL PAIN, ETIOLOGY UNKNOWN, NO PRIOR IMAGING. FINDINGS: REFER TO REPORT FROM MR ABDOMEN WITHOUT AND WITH CONTRAST MATERIAL FROM 06/22/2024 Electronically signed by: Waddell Calk MD 06/22/2024 05:17 AM EDT RP Workstation: [id number]   MR ABDOMEN MRCP W WO CONTAST Result Date: 06/22/2024 EXAM: MRCP WITH AND WITHOUT IV CONTRAST 06/22/2024 01:19:49 AM TECHNIQUE: Multisequence, multiplanar magnetic resonance images of the abdomen with and without intravenous contrast. MRCP sequences were performed. COMPARISON: CT 06/21/2024 CLINICAL HISTORY: RUQ  abdominal pain, etiology unknown, no prior imaging. FINDINGS: LIVER: Unremarkable. GALLBLADDER AND BILIARY SYSTEM: Expected postoperative changes from recent cholecystectomy. Small volume of fluid signal intensity noted within the gallbladder fossa. Common bile duct is increased in caliber measuring 8 mm. Mild intrahepatic bile duct dilatation. Multiple small stones are noted within the distal common bile duct measuring 3 mm (coronal image 15/4) and 2 mm (axial image 22/3) at the level of the ampulla. SPLEEN: Unremarkable. PANCREAS/PANCREATIC DUCT: Visualized pancreas is unremarkable. No pancreatic ductal dilatation. ADRENAL GLANDS: Unremarkable. KIDNEYS: Unremarkable. LYMPH NODES: No enlarged abdominal lymph nodes. VASULATURE: Unremarkable. PERITONEUM: No ascites. ABDOMINAL WALL: Laparoscopic port sites noted within the right ventral abdominal wall and midline ventral abdominal wall at the level of the umbilicus. No hernia. No mass. BOWEL: Grossly unremarkable. No bowel obstruction. BONES: No acute abnormality or worrisome osseous lesion. SOFT TISSUES: Unremarkable. MISCELLANEOUS: Unremarkable. IMPRESSION: 1. Status post cholecystectomy with small volume of fluid signal intensity within the gallbladder fossa. 2. Mild intrahepatic bile duct dilatation and increased caliber of the common bile duct measuring 8 mm, with suspected stones within the distal common bile duct measuring 3 mm and 2 mm. Electronically signed by: Waddell Calk MD 06/22/2024 05:03 AM EDT RP Workstation: [id number]   CT Angio Chest PE W and/or Wo Contrast Result Date: 06/21/2024 CLINICAL DATA:  Epigastric pain and chest pain. Suspect pulmonary embolism. Cholecystectomy 06/16/2024 EXAM: CT ANGIOGRAPHY CHEST CT ABDOMEN AND PELVIS WITH CONTRAST TECHNIQUE: Multidetector CT imaging of the chest was performed using the standard protocol during bolus administration of intravenous contrast. Multiplanar CT image reconstructions and MIPs were obtained to  evaluate the vascular anatomy. Multidetector CT  imaging of the abdomen and pelvis was performed using the standard protocol during bolus administration of intravenous contrast. RADIATION DOSE REDUCTION: This exam was performed according to the departmental dose-optimization program which includes automated exposure control, adjustment of the mA and/or kV according to patient size and/or use of iterative reconstruction technique. CONTRAST:  75mL OMNIPAQUE  IOHEXOL  350 MG/ML SOLN COMPARISON:  CT abdomen/pelvis 05/29/2024 and CT chest, abdomen and pelvis 03/28/2024 FINDINGS: CTA CHEST FINDINGS Cardiovascular: Heart is normal size. Thoracic aorta is normal in caliber. Pulmonary arterial system is well opacified without evidence of emboli. Remaining vascular structures are unremarkable. Mediastinum/Nodes: No mediastinal or hilar adenopathy. Remaining mediastinal structures are unremarkable. Lungs/Pleura: Lungs are adequately inflated without focal airspace consolidation or effusion. Interval resolution of previously seen effusions and bibasilar compressive atelectasis. Airways are normal. Musculoskeletal: No focal abnormality. Review of the MIP images confirms the above findings. CT ABDOMEN and PELVIS FINDINGS Hepatobiliary: Interval cholecystectomy. Minimal fluid over the gallbladder fossa. Liver and biliary tree are normal. Pancreas: Normal. Spleen: Normal. Adrenals/Urinary Tract: Adrenal glands are normal. Kidneys normal size without hydronephrosis or nephrolithiasis. Ureters and bladder are unremarkable. Stomach/Bowel: Stomach and small bowel are normal. Appendix is normal. Mild decompression and wall thickening of the transverse colon with hazy adjacent mesentery which could be seen due to mild acute colitis. Vascular/Lymphatic: Vascular structures are unremarkable. No adenopathy. Reproductive: Uterus and bilateral adnexa are unremarkable. Other: No significant free fluid. Musculoskeletal: No focal abnormality.  Review of the MIP images confirms the above findings. IMPRESSION: 1. No evidence of pulmonary embolism. No acute cardiopulmonary disease. 2. Interval cholecystectomy with minimal fluid over the gallbladder fossa. 3. Mild decompression and wall thickening of the transverse colon with hazy adjacent mesentery which could be seen due to mild acute colitis. Electronically Signed   By: Toribio Agreste M.D.   On: 06/21/2024 16:58   CT ABDOMEN PELVIS W CONTRAST Result Date: 06/21/2024 CLINICAL DATA:  Epigastric pain and chest pain. Suspect pulmonary embolism. Cholecystectomy 06/16/2024 EXAM: CT ANGIOGRAPHY CHEST CT ABDOMEN AND PELVIS WITH CONTRAST TECHNIQUE: Multidetector CT imaging of the chest was performed using the standard protocol during bolus administration of intravenous contrast. Multiplanar CT image reconstructions and MIPs were obtained to evaluate the vascular anatomy. Multidetector CT imaging of the abdomen and pelvis was performed using the standard protocol during bolus administration of intravenous contrast. RADIATION DOSE REDUCTION: This exam was performed according to the departmental dose-optimization program which includes automated exposure control, adjustment of the mA and/or kV according to patient size and/or use of iterative reconstruction technique. CONTRAST:  75mL OMNIPAQUE  IOHEXOL  350 MG/ML SOLN COMPARISON:  CT abdomen/pelvis 05/29/2024 and CT chest, abdomen and pelvis 03/28/2024 FINDINGS: CTA CHEST FINDINGS Cardiovascular: Heart is normal size. Thoracic aorta is normal in caliber. Pulmonary arterial system is well opacified without evidence of emboli. Remaining vascular structures are unremarkable. Mediastinum/Nodes: No mediastinal or hilar adenopathy. Remaining mediastinal structures are unremarkable. Lungs/Pleura: Lungs are adequately inflated without focal airspace consolidation or effusion. Interval resolution of previously seen effusions and bibasilar compressive atelectasis. Airways are  normal. Musculoskeletal: No focal abnormality. Review of the MIP images confirms the above findings. CT ABDOMEN and PELVIS FINDINGS Hepatobiliary: Interval cholecystectomy. Minimal fluid over the gallbladder fossa. Liver and biliary tree are normal. Pancreas: Normal. Spleen: Normal. Adrenals/Urinary Tract: Adrenal glands are normal. Kidneys normal size without hydronephrosis or nephrolithiasis. Ureters and bladder are unremarkable. Stomach/Bowel: Stomach and small bowel are normal. Appendix is normal. Mild decompression and wall thickening of the transverse colon with hazy adjacent mesentery which could  be seen due to mild acute colitis. Vascular/Lymphatic: Vascular structures are unremarkable. No adenopathy. Reproductive: Uterus and bilateral adnexa are unremarkable. Other: No significant free fluid. Musculoskeletal: No focal abnormality. Review of the MIP images confirms the above findings. IMPRESSION: 1. No evidence of pulmonary embolism. No acute cardiopulmonary disease. 2. Interval cholecystectomy with minimal fluid over the gallbladder fossa. 3. Mild decompression and wall thickening of the transverse colon with hazy adjacent mesentery which could be seen due to mild acute colitis. Electronically Signed   By: Toribio Agreste M.D.   On: 06/21/2024 16:58   DG Chest Portable 1 View Result Date: 06/21/2024 CLINICAL DATA:  Chest pain and shortness of breath 1 week. EXAM: PORTABLE CHEST 1 VIEW COMPARISON:  06/18/2024 FINDINGS: Lungs are adequately inflated and otherwise clear. Cardiomediastinal silhouette and remainder of the exam is unchanged. IMPRESSION: No active disease. Electronically Signed   By: Toribio Agreste M.D.   On: 06/21/2024 15:18    Anti-infectives: Anti-infectives (From admission, onward)    None       Assessment/Plan:  23 year old woman who is status post interval laparoscopic cholecystectomy 06/16/2024 by Dr. Paola, following several months of an indwelling cholecystostomy tube that was  placed during a emergent C-section for HELLP syndrome 03/27/24 Returns with abdominal pain and elevated liver enzymes. MRCP 8/3: Mild intrahepatic and common bile duct dilation with suspected stones in distal common bile duct [facility] consulted for possible ERCP, appreciate Dr. Stacia who will evaluate the patient Keep n.p.o. for now until timing of procedure determined.   LOS: 1 day    Mitzie DELENA Freund 06/22/2024

## 2024-06-23 ENCOUNTER — Encounter (HOSPITAL_COMMUNITY)
Admission: EM | Disposition: A | Discharge status: Home / Self Care | Payer: Self-pay | Source: Home / Self Care | Attending: Surgery

## 2024-06-23 ENCOUNTER — Inpatient Hospital Stay (HOSPITAL_COMMUNITY): Payer: MEDICAID

## 2024-06-23 ENCOUNTER — Encounter (HOSPITAL_COMMUNITY): Payer: Self-pay | Admitting: Surgery

## 2024-06-23 ENCOUNTER — Inpatient Hospital Stay (HOSPITAL_COMMUNITY): Payer: MEDICAID | Admitting: Anesthesiology

## 2024-06-23 DIAGNOSIS — K8050 Calculus of bile duct without cholangitis or cholecystitis without obstruction: Secondary | ICD-10-CM

## 2024-06-23 DIAGNOSIS — R7401 Elevation of levels of liver transaminase levels: Secondary | ICD-10-CM

## 2024-06-23 HISTORY — PX: ERCP: SHX5425

## 2024-06-23 LAB — COMPREHENSIVE METABOLIC PANEL WITH GFR
ALT: 497 U/L — ABNORMAL HIGH (ref 0–44)
AST: 331 U/L — ABNORMAL HIGH (ref 15–41)
Albumin: 3.4 g/dL — ABNORMAL LOW (ref 3.5–5.0)
Alkaline Phosphatase: 341 U/L — ABNORMAL HIGH (ref 38–126)
Anion gap: 11 (ref 5–15)
BUN: <5 mg/dL — ABNORMAL LOW (ref 6–20)
CO2: 25 mmol/L (ref 22–32)
Calcium: 9 mg/dL (ref 8.9–10.3)
Chloride: 101 mmol/L (ref 98–111)
Creatinine, Ser: 0.46 mg/dL (ref 0.44–1.00)
GFR, Estimated: >60 mL/min (ref >60)
Glucose, Bld: 101 mg/dL — ABNORMAL HIGH (ref 70–99)
Potassium: 3.3 mmol/L — ABNORMAL LOW (ref 3.5–5.1)
Sodium: 137 mmol/L (ref 135–145)
Total Bilirubin: 1.4 mg/dL — ABNORMAL HIGH (ref 0.0–1.2)
Total Protein: 6.5 g/dL (ref 6.5–8.1)

## 2024-06-23 LAB — MAGNESIUM: Magnesium: 1.7 mg/dL (ref 1.7–2.4)

## 2024-06-23 LAB — CBC
HCT: 37.7 % (ref 36.0–46.0)
Hemoglobin: 12.9 g/dL (ref 12.0–15.0)
MCH: 29.8 pg (ref 26.0–34.0)
MCHC: 34.2 g/dL (ref 30.0–36.0)
MCV: 87.1 fL (ref 80.0–100.0)
Platelets: 331 K/uL (ref 150–400)
RBC: 4.33 MIL/uL (ref 3.87–5.11)
RDW: 11.9 % (ref 11.5–15.5)
WBC: 6.1 K/uL (ref 4.0–10.5)
nRBC: 0 % (ref 0.0–0.2)

## 2024-06-23 SURGERY — ERCP, WITH INTERVENTION IF INDICATED
Anesthesia: General

## 2024-06-23 MED ORDER — SODIUM CHLORIDE 0.9 % IV SOLN
INTRAVENOUS | Status: AC
  Filled 2024-06-23: qty 8

## 2024-06-23 MED ORDER — LACTATED RINGERS IV SOLN
INTRAVENOUS | Status: DC | PRN
Start: 2024-06-23 — End: 2024-06-23

## 2024-06-23 MED ORDER — PHENYLEPHRINE 80 MCG/ML (10ML) SYRINGE FOR IV PUSH (FOR BLOOD PRESSURE SUPPORT)
PREFILLED_SYRINGE | INTRAVENOUS | Status: DC | PRN
  Administered 2024-06-23: 160 ug via INTRAVENOUS

## 2024-06-23 MED ORDER — GLUCAGON HCL RDNA (DIAGNOSTIC) 1 MG IJ SOLR
INTRAMUSCULAR | Status: AC
  Filled 2024-06-23: qty 1

## 2024-06-23 MED ORDER — ROCURONIUM BROMIDE 10 MG/ML (PF) SYRINGE
PREFILLED_SYRINGE | INTRAVENOUS | Status: DC | PRN
  Administered 2024-06-23: 50 mg via INTRAVENOUS

## 2024-06-23 MED ORDER — FENTANYL CITRATE (PF) 100 MCG/2ML IJ SOLN
INTRAMUSCULAR | Status: AC
Start: 2024-06-23 — End: 2024-06-23
  Filled 2024-06-23: qty 2

## 2024-06-23 MED ORDER — LACTATED RINGERS IV SOLN
INTRAVENOUS | Status: AC | PRN
  Administered 2024-06-23: 1000 mL via INTRAVENOUS

## 2024-06-23 MED ORDER — LIDOCAINE 2% (20 MG/ML) 5 ML SYRINGE
INTRAMUSCULAR | Status: DC | PRN
  Administered 2024-06-23: 60 mg via INTRAVENOUS

## 2024-06-23 MED ORDER — INDOMETHACIN 50 MG RE SUPP: 100.0000 mg | Freq: Once | RECTAL | Status: DC

## 2024-06-23 MED ORDER — DEXAMETHASONE SODIUM PHOSPHATE 10 MG/ML IJ SOLN
INTRAMUSCULAR | Status: DC | PRN
  Administered 2024-06-23: 10 mg via INTRAVENOUS

## 2024-06-23 MED ORDER — LACTATED RINGERS IV SOLN: INTRAVENOUS | Status: DC

## 2024-06-23 MED ORDER — SUGAMMADEX SODIUM 200 MG/2ML IV SOLN
INTRAVENOUS | Status: DC | PRN
  Administered 2024-06-23: 300 mg via INTRAVENOUS

## 2024-06-23 MED ORDER — ONDANSETRON HCL 4 MG/2ML IJ SOLN
INTRAMUSCULAR | Status: DC | PRN
  Administered 2024-06-23: 4 mg via INTRAVENOUS

## 2024-06-23 MED ORDER — SODIUM CHLORIDE 0.9 % IV SOLN
INTRAVENOUS | Status: DC | PRN
  Administered 2024-06-23: 25 mL

## 2024-06-23 MED ORDER — PROPOFOL 10 MG/ML IV BOLUS
INTRAVENOUS | Status: DC | PRN
  Administered 2024-06-23: 200 mg via INTRAVENOUS

## 2024-06-23 MED ORDER — INDOMETHACIN 50 MG RE SUPP
RECTAL | Status: DC | PRN
  Administered 2024-06-23: 100 mg via RECTAL

## 2024-06-23 MED ORDER — FENTANYL CITRATE (PF) 250 MCG/5ML IJ SOLN
INTRAMUSCULAR | Status: DC | PRN
  Administered 2024-06-23 (×2): 50 ug via INTRAVENOUS

## 2024-06-23 MED ORDER — SODIUM CHLORIDE 0.9 % IV SOLN
1.5000 g | Freq: Once | INTRAVENOUS | Status: AC
  Administered 2024-06-23: 1.5 g via INTRAVENOUS
  Filled 2024-06-23: qty 4
  Filled 2024-06-23: qty 1.5

## 2024-06-23 MED ORDER — INDOMETHACIN 50 MG RE SUPP
RECTAL | Status: AC
  Filled 2024-06-23: qty 2

## 2024-06-23 NOTE — Interval H&P Note (Addendum)
 History and Physical Interval Note:  06/23/2024 1:12 PM  Toni Klein  has presented today for surgery, with the diagnosis of Common bile duct stones.  The various methods of treatment have been discussed with the patient and family. After consideration of risks, benefits and other options for treatment, the patient has consented to  Procedure(s): ERCP, WITH INTERVENTION IF INDICATED (N/A) as a surgical intervention.  The patient's history has been reviewed, patient examined, no change in status, stable for surgery.  I have reviewed the patient's chart and labs.  Questions were answered to the patient's satisfaction.    Clinical history, x-rays, laboratories are personally reviewed.  Case reviewed with GI colleagues.  I am asked to see this patient regarding choledocholithiasis and ERCP.  She is an appropriate candidate without contraindication.  Currently having no pain and abdomen is benign.  LFTs up a bit today.  Query that she may have passed a CBD stone.  Communication with the patient in all respects was accomplished with professional interpretive services.   [name]. Abran Raddle., M.D. [facility] Division of Gastroenterology

## 2024-06-23 NOTE — Anesthesia Procedure Notes (Signed)
 Procedure Name: Intubation Date/Time: 06/23/2024 1:27 PM  Performed by: Scherrie Mast, CRNAPre-anesthesia Checklist: Patient identified, Emergency Drugs available, Suction available and Patient being monitored Patient Re-evaluated:Patient Re-evaluated prior to induction Oxygen Delivery Method: Circle System Utilized Preoxygenation: Pre-oxygenation with 100% oxygen Induction Type: IV induction Ventilation: Mask ventilation without difficulty Laryngoscope Size: Mac and 3 Grade View: Grade I Tube type: Oral Tube size: 7.0 mm Number of attempts: 1 Airway Equipment and Method: Stylet and Oral airway Placement Confirmation: ETT inserted through vocal cords under direct vision, positive ETCO2 and breath sounds checked- equal and bilateral Secured at: 21 cm Tube secured with: Tape Dental Injury: Teeth and Oropharynx as per pre-operative assessment

## 2024-06-23 NOTE — Op Note (Signed)
 [facility] Patient Name: Toni Klein Procedure Date : 06/23/2024 MRN: [id number] Attending MD: [name]. Abran , MD, [id number] Date of Birth: 11-May-2001 CSN: [id number] Age: 23 Admit Type: Inpatient Procedure:                ERCP with biliary sphincterotomy, duct stone                            extraction Indications:              Abdominal pain in the right upper quadrant, Bile                            duct stone on MRCP, Abnormal liver function test Providers:                [name]. Abran, MD, Randall Lines, RN, Lorrayne Kitty,                            Technician Referring MD:             [facility] surgery Medicines:                General Anesthesia Complications:            No immediate complications. Estimated Blood Loss:     Estimated blood loss: none. Procedure:                Pre-Anesthesia Assessment:                           - Prior to the procedure, a History and Physical                            was performed, and patient medications and                            allergies were reviewed. The patient is competent.                            The risks and benefits of the procedure and the                            sedation options and risks were discussed with the                            patient. All questions were answered and informed                            consent was obtained. Patient identification and                            proposed procedure were verified by the physician.                            Mental Status Examination: alert and oriented.  Airway Examination: normal oropharyngeal airway and                            neck mobility. Respiratory Examination: clear to                            auscultation. CV Examination: normal. Prophylactic                            Antibiotics: The patient does not require                            prophylactic antibiotics. Prior  Anticoagulants: The                            patient has taken no anticoagulant or antiplatelet                            agents. ASA Grade Assessment: II - A patient with                            mild systemic disease. After reviewing the risks                            and benefits, the patient was deemed in                            satisfactory condition to undergo the procedure.                            The anesthesia plan was to use moderate sedation /                            analgesia (conscious sedation). Immediately prior                            to administration of medications, the patient was                            re-assessed for adequacy to receive sedatives. The                            heart rate, respiratory rate, oxygen saturations,                            blood pressure, adequacy of pulmonary ventilation,                            and response to care were monitored throughout the                            procedure. The physical status of the patient was  re-assessed after the procedure.                           After obtaining informed consent, the scope was                            passed under direct vision. Throughout the                            procedure, the patient's blood pressure, pulse, and                            oxygen saturations were monitored continuously. The                            [id number]-Q190V ([id number] Olympus duodenoscope was                            introduced through the mouth, and used to inject                            contrast into and used to inject contrast into the                            bile duct. The ERCP was accomplished without                            difficulty. The patient tolerated the procedure                            well. Scope In: Scope Out: Findings:      1. The side-viewing scope was passed blindly into the esophagus the       stomach, duodenum, and major  papilla were unremarkable. The minor       papilla was not sought.      2. Scout radiograph of the abdomen with the endoscope and position       revealed cholecystectomy clips      3. Initial injection of contrast revealed the bile duct. This was       subsequently cannulated deeply over a hydrophilic guidewire.      4. Injection of contrast throughout revealed an unremarkable biliary       tree with borderline dilation. There was filling of the cystic duct       remnant. Questionable distal stone fragment noted. Multiple air bubbles       into the biliary tree.      5. A moderate biliary sphincterotomy was made by cutting over the       hydrophilic guidewire in the 12:00 orientation using the ERBE system.       There was self-limited venous oozing.      6. A sequential balloon was pulled through the biliary tree multiple       times. Only 1 diminutive stone fragment extracted. The remainder of the       biliary tree was devoid of filling defects and demonstrated excellent       drainage.      7. There was no  injection or manipulation of the pancreatic duct. Impression:               1. Status post ERC with biliary sphincterotomy and                            stone fragment extraction                           2. Status post cholecystectomy. Recommendation:           1. Standard post ERCP care                           2. Trend liver tests and clinical progress                           3. Advance diet as tolerated                           4. Inpatient GI team will follow up. They are aware                            of the outcome of today's procedure.                           The patient was seen in the postoperative area and                            provided a copy of this report. Procedure Code(s):        --- Professional ---                           [phone], Endoscopic retrograde                            cholangiopancreatography (ERCP); with removal of                             calculi/debris from biliary/pancreatic duct(s)                           43262, Endoscopic retrograde                            cholangiopancreatography (ERCP); with                            sphincterotomy/papillotomy Diagnosis Code(s):        --- Professional ---                           Z90.49, Acquired absence of other specified parts                            of digestive tract  K80.50, Calculus of bile duct without cholangitis                            or cholecystitis without obstruction                           R10.11, Right upper quadrant pain                           R79.89, Other specified abnormal findings of blood                            chemistry                           K83.8, Other specified diseases of biliary tract CPT copyright 2022 [employer]. All rights reserved. The codes documented in this report are preliminary and upon coder review may  be revised to meet current compliance requirements. [name]. Abran, MD 06/23/2024 2:25:40 PM This report has been signed electronically. Number of Addenda: 0

## 2024-06-23 NOTE — Anesthesia Postprocedure Evaluation (Signed)
 Anesthesia Post Note  Patient: Toni Klein  Procedure(s) Performed: ERCP, WITH INTERVENTION IF INDICATED     Patient location during evaluation: PACU Anesthesia Type: General Level of consciousness: awake and alert Pain management: pain level controlled Vital Signs Assessment: post-procedure vital signs reviewed and stable Respiratory status: spontaneous breathing, nonlabored ventilation and respiratory function stable Cardiovascular status: stable and blood pressure returned to baseline Anesthetic complications: no   There were no known notable events for this encounter.  Last Vitals:  Vitals:   06/23/24 1430 06/23/24 1440  BP: 123/78 (!) 115/91  Pulse: 77 71  Resp: 17 (!) 21  Temp:    SpO2: 99% 96%    Last Pain:  Vitals:   06/23/24 1440  TempSrc:   PainSc: 0-No pain                 [name] Like

## 2024-06-23 NOTE — Anesthesia Preprocedure Evaluation (Addendum)
 Anesthesia Evaluation  Patient identified by MRN, date of birth, ID band Patient awake    Reviewed: Allergy & Precautions, NPO status , Patient's Chart, lab work & pertinent test results  History of Anesthesia Complications Negative for: history of anesthetic complications  Airway Mallampati: II  TM Distance: >3 FB Neck ROM: Full    Dental  (+) Dental Advisory Given, Teeth Intact   Pulmonary neg pulmonary ROS   Pulmonary exam normal        Cardiovascular hypertension, Normal cardiovascular exam     Neuro/Psych negative neurological ROS  negative psych ROS   GI/Hepatic Neg liver ROS,,, CBD stones    Endo/Other   K 3.3   Renal/GU negative Renal ROS     Musculoskeletal negative musculoskeletal ROS (+)    Abdominal   Peds  Hematology negative hematology ROS (+)   Anesthesia Other Findings   Reproductive/Obstetrics  Hx HELLP during recent pregnancy                               Anesthesia Physical Anesthesia Plan  ASA: 2  Anesthesia Plan: General   Post-op Pain Management: Minimal or no pain anticipated   Induction: Intravenous  PONV Risk Score and Plan: 3 and Treatment may vary due to age or medical condition, Ondansetron , Dexamethasone  and Midazolam   Airway Management Planned: Oral ETT  Additional Equipment: None  Intra-op Plan:   Post-operative Plan: Extubation in OR  Informed Consent: I have reviewed the patients History and Physical, chart, labs and discussed the procedure including the risks, benefits and alternatives for the proposed anesthesia with the patient or authorized representative who has indicated his/her understanding and acceptance.     Dental advisory given  Plan Discussed with: CRNA and Anesthesiologist  Anesthesia Plan Comments:          Anesthesia Quick Evaluation

## 2024-06-23 NOTE — Plan of Care (Signed)

## 2024-06-23 NOTE — Transfer of Care (Signed)
 Immediate Anesthesia Transfer of Care Note  Patient: Toni Klein  Procedure(s) Performed: ERCP, WITH INTERVENTION IF INDICATED  Patient Location: Endoscopy Unit  Anesthesia Type:General  Level of Consciousness: awake, alert , and oriented  Airway & Oxygen Therapy: Patient Spontanous Breathing and Patient connected to nasal cannula oxygen  Post-op Assessment: Report given to RN and Post -op Vital signs reviewed and stable  Post vital signs: Reviewed and stable  Last Vitals:  Vitals Value Taken Time  BP 126/85 06/23/24 14:20  Temp 36.3 C 06/23/24 14:17  Pulse 73 06/23/24 14:23  Resp 32 06/23/24 14:23  SpO2 97 % 06/23/24 14:23  Vitals shown include unfiled device data.  Last Pain:  Vitals:   06/23/24 1417  TempSrc: Temporal  PainSc: 0-No pain         Complications: No notable events documented.

## 2024-06-24 ENCOUNTER — Encounter (HOSPITAL_COMMUNITY): Payer: Self-pay | Admitting: Internal Medicine

## 2024-06-24 LAB — COMPREHENSIVE METABOLIC PANEL WITH GFR
ALT: 304 U/L — ABNORMAL HIGH (ref 0–44)
AST: 72 U/L — ABNORMAL HIGH (ref 15–41)
Albumin: 3.5 g/dL (ref 3.5–5.0)
Alkaline Phosphatase: 230 U/L — ABNORMAL HIGH (ref 38–126)
Anion gap: 6 (ref 5–15)
BUN: 15 mg/dL (ref 6–20)
CO2: 25 mmol/L (ref 22–32)
Calcium: 9.4 mg/dL (ref 8.9–10.3)
Chloride: 109 mmol/L (ref 98–111)
Creatinine, Ser: 0.44 mg/dL (ref 0.44–1.00)
GFR, Estimated: >60 mL/min (ref >60)
Glucose, Bld: 115 mg/dL — ABNORMAL HIGH (ref 70–99)
Potassium: 3.3 mmol/L — ABNORMAL LOW (ref 3.5–5.1)
Sodium: 140 mmol/L (ref 135–145)
Total Bilirubin: 0.4 mg/dL (ref 0.0–1.2)
Total Protein: 6.9 g/dL (ref 6.5–8.1)

## 2024-06-24 NOTE — Progress Notes (Signed)
 Daily Progress Note  DOA: 06/21/2024 Hospital Day: 4   Patient Profile:   23 year old mostly Spanish speaking female with a past medical history of HELLP , cholecystostomy tube placement in May for cholecystitis while hospitalized with HELLP  Subsequently had cholecystectomy July 2025.  Patient admitted with epigastric pain, nausea and vomiting, elevated LFTs . Found to have choledocholithiasis.  See 8/3 GI consult note for details.    ASSESSMENT    Choledocholithiasis Status post ERC with biliary sphincterotomy and stone fragment extraction 06/23/2024. Status post cholecystectomy July 2025 TODAY>> LFTs slightly worse overnight but not unexpected post procedure. Having some mild nausea and mild mid upper abdominal discomfort today. Nausea may be due in part to narcotics.   Principal Problem:   S/P laparoscopic cholecystectomy Active Problems:   Choledocholithiasis   Transaminitis   PLAN   Am LFTs Continue Zofran  prn  Subjective   Having some mild nausea and mild mid upper abdominal discomfort today.   Objective     Recent Labs    06/21/24 1412 06/22/24 0207 06/23/24 0408  WBC 9.5 6.9 6.1  HGB 14.9 13.4 12.9  HCT 43.5 38.9 37.7  MCV 86.7 87.4 87.1  PLT 350 315 331   No results for input(s): FOLATE, VITAMINB12, FERRITIN, TIBC, IRONPCTSAT in the last 72 hours. Recent Labs    06/21/24 1457 06/22/24 0207 06/23/24 0408  NA 137 138 137  K 3.9 3.6 3.3*  CL 105 106 101  CO2 22 18* 25  GLUCOSE 110* 73 101*  BUN <5* <5* <5*  CREATININE 0.41* 0.39* 0.46  CALCIUM  9.8 9.3 9.0   Recent Labs    06/21/24 1457 06/22/24 0207 06/23/24 0408  PROT 7.8 7.0 6.5  ALBUMIN  4.1 3.7 3.4*  AST 209* 163* 331*  ALT 702* 547* 497*  ALKPHOS 255* 254* 341*  BILITOT 1.9* 1.1 1.4*  BILIDIR 0.6*  --   --   IBILI 1.3*  --   --       Imaging:  MR ABDOMEN MRCP W WO CONTAST EXAM: MRCP WITH AND WITHOUT IV CONTRAST 06/22/2024 01:19:49  AM  TECHNIQUE: Multisequence, multiplanar magnetic resonance images of the abdomen with and without intravenous contrast. MRCP sequences were performed.  COMPARISON: CT 06/21/2024  CLINICAL HISTORY: RUQ abdominal pain, etiology unknown, no prior imaging.  FINDINGS:  LIVER: Unremarkable.  GALLBLADDER AND BILIARY SYSTEM: Expected postoperative changes from recent cholecystectomy. Small volume of fluid signal intensity noted within the gallbladder fossa. Common bile duct is increased in caliber measuring 8 mm. Mild intrahepatic bile duct dilatation. Multiple small stones are noted within the distal common bile duct measuring 3 mm (coronal image 15/4) and 2 mm (axial image 22/3) at the level of the ampulla.  SPLEEN: Unremarkable.  PANCREAS/PANCREATIC DUCT: Visualized pancreas is unremarkable. No pancreatic ductal dilatation.  ADRENAL GLANDS: Unremarkable.  KIDNEYS: Unremarkable.  LYMPH NODES: No enlarged abdominal lymph nodes.  VASULATURE: Unremarkable.  PERITONEUM: No ascites.  ABDOMINAL WALL: Laparoscopic port sites noted within the right ventral abdominal wall and midline ventral abdominal wall at the level of the umbilicus. No hernia. No mass.  BOWEL: Grossly unremarkable. No bowel obstruction.  BONES: No acute abnormality or worrisome osseous lesion.  SOFT TISSUES: Unremarkable.  MISCELLANEOUS: Unremarkable.  IMPRESSION: 1. Status post cholecystectomy with small volume of fluid signal intensity within the gallbladder fossa. 2. Mild intrahepatic bile duct dilatation and increased caliber of the common bile duct measuring 8 mm, with suspected stones within the distal common bile duct measuring 3 mm  and 2 mm.  Electronically signed by: Waddell Calk MD 06/22/2024 05:03 AM EDT RP Workstation: [id number]     Scheduled inpatient medications:   indomethacin   100 mg Rectal Once   polyethylene glycol  17 g Oral Daily   Continuous inpatient  infusions:  PRN inpatient medications: diphenhydrAMINE  **OR** diphenhydrAMINE , hydrALAZINE , HYDROmorphone  (DILAUDID ) injection, ibuprofen , melatonin, ondansetron  **OR** ondansetron  (ZOFRAN ) IV, oxyCODONE , simethicone   Vital signs in last 24 hours: Temp:  [97.3 F (36.3 C)-99.4 F (37.4 C)] (P) 98.1 F (36.7 C) (08/05 0747) Pulse Rate:  [71-94] 94 (08/05 0747) Resp:  [17-28] 18 (08/05 0747) BP: (106-126)/(66-91) 106/74 (08/05 0747) SpO2:  [93 %-99 %] 97 % (08/05 0009) Last BM Date : 06/20/24  Intake/Output Summary (Last 24 hours) at 06/24/2024 1232 Last data filed at 06/24/2024 0001 Gross per 24 hour  Intake 340 ml  Output --  Net 340 ml    Intake/Output from previous day: 08/04 0701 - 08/05 0700 In: 340 [P.O.:240; IV Piggyback:100] Out: -  Intake/Output this shift: No intake/output data recorded.   Physical Exam:  General: Alert female in NAD Heart:  Regular rate and rhythm.  Pulmonary: Normal respiratory effort Abdomen: Soft, nondistended, nontender. Normal bowel sounds. Extremities: No lower extremity edema  Neurologic: Alert and oriented Psych: Pleasant. Cooperative     LOS: 3 days   Vina Dasen ,NP 06/24/2024, 12:32 PM      Anybody else.

## 2024-06-24 NOTE — Progress Notes (Signed)
 Transition of Care [facility]) - Inpatient Brief Assessment   Patient Details  Name: Toni Klein MRN: [id number] Date of Birth: 02-16-2001  [facility] CM/SW Contact:    Rosaline JONELLE Joe, RN Phone Number: 06/24/2024, 11:48 AM   Clinical Narrative: Patient admitted to the hospital from home - S/P Laparoscopy cholecystectomy on 06/16/2024.  No IP Care management needs at this time.    Patient will have follow up with [facility] post surgery.  PCP follow up placed in AVS for patient to call and schedule since patient has no listed primary care provider.   Transition of Care Asessment: Insurance and Status: (P) Insurance coverage has been reviewed Patient has primary care physician: (P) No (PCP follow up placed in AVS) Home environment has been reviewed: (P) from home Prior level of function:: (P) self Prior/Current Home Services: (P) No current home services Social Drivers of Health Review: (P) SDOH reviewed no interventions necessary Readmission risk has been reviewed: (P) Yes Transition of care needs: (P) no transition of care needs at this time

## 2024-06-24 NOTE — Plan of Care (Signed)

## 2024-06-25 ENCOUNTER — Other Ambulatory Visit (HOSPITAL_COMMUNITY): Payer: Self-pay

## 2024-06-25 LAB — COMPREHENSIVE METABOLIC PANEL WITH GFR
ALT: 256 U/L — ABNORMAL HIGH (ref 0–44)
AST: 54 U/L — ABNORMAL HIGH (ref 15–41)
Albumin: 3.4 g/dL — ABNORMAL LOW (ref 3.5–5.0)
Alkaline Phosphatase: 196 U/L — ABNORMAL HIGH (ref 38–126)
Anion gap: 8 (ref 5–15)
BUN: 13 mg/dL (ref 6–20)
CO2: 22 mmol/L (ref 22–32)
Calcium: 9.1 mg/dL (ref 8.9–10.3)
Chloride: 108 mmol/L (ref 98–111)
Creatinine, Ser: 0.44 mg/dL (ref 0.44–1.00)
GFR, Estimated: >60 mL/min (ref >60)
Glucose, Bld: 114 mg/dL — ABNORMAL HIGH (ref 70–99)
Potassium: 3.3 mmol/L — ABNORMAL LOW (ref 3.5–5.1)
Sodium: 138 mmol/L (ref 135–145)
Total Bilirubin: 0.5 mg/dL (ref 0.0–1.2)
Total Protein: 6.4 g/dL — ABNORMAL LOW (ref 6.5–8.1)

## 2024-06-25 MED ORDER — ACETAMINOPHEN 500 MG PO TABS
1000.0000 mg | ORAL_TABLET | Freq: Four times a day (QID) | ORAL | 3 refills | Status: AC
Start: 2024-06-25 — End: ?
  Filled 2024-06-25: qty 100, 13d supply, fill #0

## 2024-06-25 MED ORDER — OXYCODONE HCL 5 MG PO TABS
5.0000 mg | ORAL_TABLET | ORAL | 0 refills | Status: AC | PRN
  Filled 2024-06-25: qty 20, 4d supply, fill #0

## 2024-06-25 MED ORDER — POTASSIUM CHLORIDE 20 MEQ PO PACK
40.0000 meq | PACK | ORAL | Status: DC
  Administered 2024-06-25: 40 meq via ORAL
  Filled 2024-06-25: qty 2

## 2024-06-25 MED ORDER — POLYETHYLENE GLYCOL 3350 17 GM/SCOOP PO POWD
17.0000 g | Freq: Every day | ORAL | 1 refills | Status: AC
  Filled 2024-06-25: qty 476, 28d supply, fill #0

## 2024-06-25 MED ORDER — METHOCARBAMOL 750 MG PO TABS
750.0000 mg | ORAL_TABLET | Freq: Four times a day (QID) | ORAL | 1 refills | Status: AC
  Filled 2024-06-25: qty 120, 30d supply, fill #0

## 2024-06-25 MED ORDER — IBUPROFEN 600 MG PO TABS
600.0000 mg | ORAL_TABLET | Freq: Four times a day (QID) | ORAL | 1 refills | Status: AC
  Filled 2024-06-25: qty 120, 30d supply, fill #0

## 2024-06-25 NOTE — Progress Notes (Signed)
 Patient's IV removed for discharge. All instructions discussed with patient. Patient verbalized understanding of teaching. TOC medications obtained from pharmacy and delivered to patient at the bedside.

## 2024-06-25 NOTE — Discharge Summary (Signed)
    Patient ID: Toni Klein [id number] September 13, 2001 23 y.o.  Admit date: 06/21/2024 Discharge date: 06/25/2024  Admitting Diagnosis: choledocholithiasis  Discharge Diagnosis Patient Active Problem List   Diagnosis Date Noted   Choledocholithiasis 06/23/2024   Transaminitis 06/23/2024   S/P laparoscopic cholecystectomy 06/21/2024   Abdominal wall hematoma, post Caesarean section in setting of DIC 04/04/2024   Acute cholecystitis 03/27/2024   History of cesarean section 03/27/2024   History of hemolysis, elevated liver enzymes, and low platelet (HELLP) syndrome 03/27/2024   History of DIC syndrome 03/27/2024   Language barrier affecting health care 03/13/2024   ANA positive 11/19/2023    Consultants GI  Reason for Admission: choledocholithiasis  Procedures ERCP 8/4  Hospital Course:  Uncomplicated    Physical Exam: Gen: comfortable, no distress Neuro: non-focal exam HEENT: PERRL Neck: supple CV: RRR Pulm: unlabored breathing Abd: soft, NT, incisions cdi GU: clear yellow urine Extr: wwp, no edema   Allergies as of 06/25/2024   No Known Allergies      Medication List     STOP taking these medications    NIFEdipine  30 MG 24 hr tablet Commonly known as: ADALAT  CC   nitrofurantoin  (macrocrystal-monohydrate) 100 MG capsule Commonly known as: MACROBID        TAKE these medications    acetaminophen  500 MG tablet Commonly known as: TYLENOL  Take 2 tablets (1,000 mg total) by mouth every 6 (six) hours.   ibuprofen  600 MG tablet Commonly known as: ADVIL  Take 1 tablet (600 mg total) by mouth 4 (four) times daily. What changed:  when to take this reasons to take this   methocarbamol  750 MG tablet Commonly known as: ROBAXIN  Take 1 tablet (750 mg total) by mouth 4 (four) times daily.   multivitamin-prenatal 27-0.8 MG Tabs tablet Take 1 tablet by mouth daily at 12 noon.   oxyCODONE  5 MG immediate release tablet Commonly known as: Oxy  IR/ROXICODONE  Take 1 tablet (5 mg total) by mouth every 4 (four) hours as needed for severe pain (pain score 7-10).   polyethylene glycol 17 g packet Commonly known as: MiraLax  Take 17 g by mouth daily.          Follow-up Information     [facility] Follow up.   Specialty: Internal Medicine Why: Please call the Patient Care Center and schedule a PCP visit in the next 7-10 days. Contact information: [address] [place]  [address] [phone]        Paola Dreama SAILOR, MD Follow up in 2 week(s).   Specialty: Surgery Contact information: [address] [place] [address] [facility] [place] [address] [phone]                  Signed: Dreama GEANNIE Paola, MD [facility] Surgery 06/25/2024, 6:54 AM

## 2024-06-25 NOTE — Plan of Care (Signed)

## 2024-06-25 NOTE — Progress Notes (Signed)
 General Surgery Follow Up Note  Subjective:    Overnight Issues:   Objective:  Vital signs for last 24 hours: Temp:  [97.9 F (36.6 C)-98.3 F (36.8 C)] 97.9 F (36.6 C) (08/06 0433) Pulse Rate:  [77-98] 98 (08/06 0433) Resp:  [16-18] 18 (08/06 0433) BP: (98-106)/(60-74) 98/73 (08/06 0433) SpO2:  [100 %] 100 % (08/06 0433)  Hemodynamic parameters for last 24 hours:    Intake/Output from previous day: 08/05 0701 - 08/06 0700 In: 100 [P.O.:100] Out: -   Intake/Output this shift: Total I/O In: 100 [P.O.:100] Out: -   Vent settings for last 24 hours:    Physical Exam:  Gen: comfortable, no distress Neuro: follows commands, alert, communicative HEENT: PERRL Neck: supple CV: RRR Pulm: unlabored breathing on RA Abd: soft, NT, incision clean, dry, intact  GU: urine clear and yellow, +spontaneous void Extr: wwp, no edema  Results for orders placed or performed during the hospital encounter of 06/21/24 (from the past 24 hours)  Comprehensive metabolic panel     Status: Abnormal   Collection Time: 06/24/24  5:22 PM  Result Value Ref Range   Sodium 140 135 - 145 mmol/L   Potassium 3.3 (L) 3.5 - 5.1 mmol/L   Chloride 109 98 - 111 mmol/L   CO2 25 22 - 32 mmol/L   Glucose, Bld 115 (H) 70 - 99 mg/dL   BUN 15 6 - 20 mg/dL   Creatinine, Ser 9.55 0.44 - 1.00 mg/dL   Calcium  9.4 8.9 - 10.3 mg/dL   Total Protein 6.9 6.5 - 8.1 g/dL   Albumin  3.5 3.5 - 5.0 g/dL   AST 72 (H) 15 - 41 U/L   ALT 304 (H) 0 - 44 U/L   Alkaline Phosphatase 230 (H) 38 - 126 U/L   Total Bilirubin 0.4 0.0 - 1.2 mg/dL   GFR, Estimated >39 >39 mL/min   Anion gap 6 5 - 15  Comprehensive metabolic panel     Status: Abnormal   Collection Time: 06/25/24  4:08 AM  Result Value Ref Range   Sodium 138 135 - 145 mmol/L   Potassium 3.3 (L) 3.5 - 5.1 mmol/L   Chloride 108 98 - 111 mmol/L   CO2 22 22 - 32 mmol/L   Glucose, Bld 114 (H) 70 - 99 mg/dL   BUN 13 6 - 20 mg/dL   Creatinine, Ser 9.55 0.44 -  1.00 mg/dL   Calcium  9.1 8.9 - 10.3 mg/dL   Total Protein 6.4 (L) 6.5 - 8.1 g/dL   Albumin  3.4 (L) 3.5 - 5.0 g/dL   AST 54 (H) 15 - 41 U/L   ALT 256 (H) 0 - 44 U/L   Alkaline Phosphatase 196 (H) 38 - 126 U/L   Total Bilirubin 0.5 0.0 - 1.2 mg/dL   GFR, Estimated >39 >39 mL/min   Anion gap 8 5 - 15    Assessment & Plan:  Present on Admission: **None**    LOS: 4 days   Additional comments:I reviewed the patient's new clinical lab test results.   and I reviewed the patients new imaging test results.    S/P interval lap chole 7/28 after HELLP and open cholecystostomy tube placement - now s/p ERCP and clearance of CBD by GI 8/4 - LFTs downtrending and Tbili normal - tol regular diet - discharge home today   Entire visit conducted in spanish using the assistance of phone interpreter services    Dreama GEANNIE Hanger, MD Trauma & General Surgery Please  use [email or web address] to contact on call provider  06/25/2024  *Care during the described time interval was provided by me. I have reviewed this patient's available data, including medical history, events of note, physical examination and test results as part of my evaluation.

## 2024-06-25 NOTE — Progress Notes (Signed)
 General Surgery Follow Up Note  Subjective:    Overnight Issues:   Objective:  Vital signs for last 24 hours: Temp:  [97.9 F (36.6 C)-98.3 F (36.8 C)] 97.9 F (36.6 C) (08/06 0433) Pulse Rate:  [77-98] 98 (08/06 0433) Resp:  [16-18] 18 (08/06 0433) BP: (98-106)/(60-74) 98/73 (08/06 0433) SpO2:  [100 %] 100 % (08/06 0433)  Hemodynamic parameters for last 24 hours:    Intake/Output from previous day: 08/05 0701 - 08/06 0700 In: 100 [P.O.:100] Out: -   Intake/Output this shift: Total I/O In: 100 [P.O.:100] Out: -   Vent settings for last 24 hours:    Physical Exam:  Gen: comfortable, no distress Neuro: follows commands, alert, communicative HEENT: PERRL Neck: supple CV: RRR Pulm: unlabored breathing on RA Abd: soft, NT, incision clean, dry, intact  GU: urine clear and yellow, +spontaneous void Extr: wwp, no edema  Results for orders placed or performed during the hospital encounter of 06/21/24 (from the past 24 hours)  Comprehensive metabolic panel     Status: Abnormal   Collection Time: 06/24/24  5:22 PM  Result Value Ref Range   Sodium 140 135 - 145 mmol/L   Potassium 3.3 (L) 3.5 - 5.1 mmol/L   Chloride 109 98 - 111 mmol/L   CO2 25 22 - 32 mmol/L   Glucose, Bld 115 (H) 70 - 99 mg/dL   BUN 15 6 - 20 mg/dL   Creatinine, Ser 9.55 0.44 - 1.00 mg/dL   Calcium  9.4 8.9 - 10.3 mg/dL   Total Protein 6.9 6.5 - 8.1 g/dL   Albumin  3.5 3.5 - 5.0 g/dL   AST 72 (H) 15 - 41 U/L   ALT 304 (H) 0 - 44 U/L   Alkaline Phosphatase 230 (H) 38 - 126 U/L   Total Bilirubin 0.4 0.0 - 1.2 mg/dL   GFR, Estimated >39 >39 mL/min   Anion gap 6 5 - 15  Comprehensive metabolic panel     Status: Abnormal   Collection Time: 06/25/24  4:08 AM  Result Value Ref Range   Sodium 138 135 - 145 mmol/L   Potassium 3.3 (L) 3.5 - 5.1 mmol/L   Chloride 108 98 - 111 mmol/L   CO2 22 22 - 32 mmol/L   Glucose, Bld 114 (H) 70 - 99 mg/dL   BUN 13 6 - 20 mg/dL   Creatinine, Ser 9.55 0.44 -  1.00 mg/dL   Calcium  9.1 8.9 - 10.3 mg/dL   Total Protein 6.4 (L) 6.5 - 8.1 g/dL   Albumin  3.4 (L) 3.5 - 5.0 g/dL   AST 54 (H) 15 - 41 U/L   ALT 256 (H) 0 - 44 U/L   Alkaline Phosphatase 196 (H) 38 - 126 U/L   Total Bilirubin 0.5 0.0 - 1.2 mg/dL   GFR, Estimated >39 >39 mL/min   Anion gap 8 5 - 15    Assessment & Plan:  Present on Admission: **None**    LOS: 4 days   Additional comments:I reviewed the patient's new clinical lab test results.   and I reviewed the patients new imaging test results.    S/P interval lap chole 7/28 after HELLP and open cholecystostomy tube placement - now s/p ERCP and clearance of CBD by GI 8/4 - awaiting today's CMP - tol CLD, advance to regular - anticipate d/c in AM if LFTs downtrending and tolerating diet  Entire visit conducted in spanish using the assistance of in-person hospital interpreter, Raquel.    Deunte Bledsoe N.  Paola, MD Trauma & General Surgery Please use [email or web address] to contact on call provider  06/25/2024  *Care during the described time interval was provided by me. I have reviewed this patient's available data, including medical history, events of note, physical examination and test results as part of my evaluation.
